# Patient Record
Sex: Male | Born: 1935 | Race: White | Hispanic: No | Marital: Married | Smoking: Former smoker
Health system: Southern US, Community
[De-identification: ages and names within clinical notes are randomized; demographics above are authoritative.]

## PROBLEM LIST (undated history)

## (undated) DIAGNOSIS — E785 Hyperlipidemia, unspecified: Secondary | ICD-10-CM

## (undated) DIAGNOSIS — C801 Malignant (primary) neoplasm, unspecified: Secondary | ICD-10-CM

## (undated) DIAGNOSIS — I5030 Unspecified diastolic (congestive) heart failure: Secondary | ICD-10-CM

## (undated) DIAGNOSIS — E119 Type 2 diabetes mellitus without complications: Secondary | ICD-10-CM

## (undated) DIAGNOSIS — I2510 Atherosclerotic heart disease of native coronary artery without angina pectoris: Secondary | ICD-10-CM

## (undated) DIAGNOSIS — C61 Malignant neoplasm of prostate: Secondary | ICD-10-CM

## (undated) HISTORY — DX: Atherosclerotic heart disease of native coronary artery without angina pectoris: I25.10

## (undated) HISTORY — DX: Unspecified diastolic (congestive) heart failure: I50.30

## (undated) HISTORY — DX: Malignant neoplasm of prostate: C61

## (undated) HISTORY — DX: Malignant (primary) neoplasm, unspecified: C80.1

## (undated) HISTORY — PX: MASTOIDECTOMY: SHX711

## (undated) HISTORY — PX: TONSILLECTOMY: SUR1361

## (undated) HISTORY — DX: Type 2 diabetes mellitus without complications: E11.9

## (undated) HISTORY — DX: Hyperlipidemia, unspecified: E78.5

---

## 2004-03-27 ENCOUNTER — Ambulatory Visit: Payer: Self-pay | Admitting: Urology

## 2004-04-13 ENCOUNTER — Other Ambulatory Visit: Payer: Self-pay

## 2004-04-13 ENCOUNTER — Ambulatory Visit: Payer: Self-pay | Admitting: Urology

## 2004-04-17 ENCOUNTER — Ambulatory Visit: Payer: Self-pay | Admitting: Urology

## 2004-04-26 ENCOUNTER — Ambulatory Visit: Payer: Self-pay | Admitting: Internal Medicine

## 2004-09-22 ENCOUNTER — Ambulatory Visit: Payer: Self-pay | Admitting: Urology

## 2004-11-16 ENCOUNTER — Inpatient Hospital Stay: Payer: Self-pay | Admitting: Internal Medicine

## 2004-11-17 ENCOUNTER — Other Ambulatory Visit: Payer: Self-pay

## 2004-11-18 ENCOUNTER — Other Ambulatory Visit: Payer: Self-pay

## 2004-12-08 ENCOUNTER — Ambulatory Visit: Payer: Self-pay | Admitting: Internal Medicine

## 2004-12-25 ENCOUNTER — Ambulatory Visit: Payer: Self-pay | Admitting: Internal Medicine

## 2005-02-23 ENCOUNTER — Ambulatory Visit: Payer: Self-pay | Admitting: Urology

## 2005-02-26 HISTORY — PX: PROSTATE SURGERY: SHX751

## 2005-02-26 HISTORY — PX: BLADDER REMOVAL: SHX567

## 2005-02-27 ENCOUNTER — Ambulatory Visit: Payer: Self-pay | Admitting: Urology

## 2005-03-19 ENCOUNTER — Ambulatory Visit: Payer: Self-pay | Admitting: Urology

## 2005-03-20 ENCOUNTER — Ambulatory Visit: Payer: Self-pay | Admitting: Gastroenterology

## 2005-07-03 ENCOUNTER — Ambulatory Visit: Payer: Self-pay | Admitting: Urology

## 2005-07-09 ENCOUNTER — Ambulatory Visit: Payer: Self-pay | Admitting: Urology

## 2006-01-31 ENCOUNTER — Ambulatory Visit: Payer: Self-pay | Admitting: Urology

## 2006-08-02 ENCOUNTER — Ambulatory Visit: Payer: Self-pay | Admitting: Urology

## 2006-08-12 ENCOUNTER — Other Ambulatory Visit: Payer: Self-pay

## 2006-08-12 ENCOUNTER — Emergency Department: Payer: Self-pay | Admitting: Emergency Medicine

## 2007-02-03 ENCOUNTER — Ambulatory Visit: Payer: Self-pay | Admitting: Urology

## 2007-08-13 ENCOUNTER — Ambulatory Visit: Payer: Self-pay | Admitting: Urology

## 2008-02-11 ENCOUNTER — Ambulatory Visit: Payer: Self-pay | Admitting: Urology

## 2008-02-20 ENCOUNTER — Emergency Department: Payer: Self-pay

## 2008-05-27 ENCOUNTER — Inpatient Hospital Stay: Payer: Self-pay | Admitting: Internal Medicine

## 2009-03-03 ENCOUNTER — Ambulatory Visit: Payer: Self-pay | Admitting: Urology

## 2010-03-08 ENCOUNTER — Ambulatory Visit: Payer: Self-pay | Admitting: Urology

## 2011-03-12 ENCOUNTER — Ambulatory Visit: Payer: Self-pay | Admitting: Urology

## 2011-05-17 ENCOUNTER — Ambulatory Visit: Payer: Self-pay | Admitting: Cardiology

## 2011-05-17 LAB — CBC WITH DIFFERENTIAL/PLATELET
Basophil %: 1.1 %
Eosinophil #: 0.2 10*3/uL (ref 0.0–0.7)
Eosinophil %: 4.4 %
HGB: 11.3 g/dL — ABNORMAL LOW (ref 13.0–18.0)
Lymphocyte %: 27.4 %
MCH: 30.5 pg (ref 26.0–34.0)
MCV: 91 fL (ref 80–100)
Monocyte #: 0.4 10*3/uL (ref 0.0–0.7)
Monocyte %: 8.8 %
Platelet: 159 10*3/uL (ref 150–440)
RBC: 3.7 10*6/uL — ABNORMAL LOW (ref 4.40–5.90)
WBC: 5 10*3/uL (ref 3.8–10.6)

## 2011-05-17 LAB — BASIC METABOLIC PANEL
Anion Gap: 10 (ref 7–16)
BUN: 30 mg/dL — ABNORMAL HIGH (ref 7–18)
Creatinine: 1.67 mg/dL — ABNORMAL HIGH (ref 0.60–1.30)
EGFR (African American): 50 — ABNORMAL LOW
EGFR (Non-African Amer.): 41 — ABNORMAL LOW
Glucose: 140 mg/dL — ABNORMAL HIGH (ref 65–99)
Sodium: 143 mmol/L (ref 136–145)

## 2011-05-17 LAB — APTT: Activated PTT: 28.2 secs (ref 23.6–35.9)

## 2011-05-24 ENCOUNTER — Ambulatory Visit: Payer: Self-pay | Admitting: Cardiology

## 2011-10-07 ENCOUNTER — Emergency Department: Payer: Self-pay | Admitting: Emergency Medicine

## 2011-10-07 LAB — CBC
HCT: 33.3 % — ABNORMAL LOW (ref 40.0–52.0)
HGB: 11.5 g/dL — ABNORMAL LOW (ref 13.0–18.0)
MCHC: 34.6 g/dL (ref 32.0–36.0)
MCV: 91 fL (ref 80–100)
RBC: 3.67 10*6/uL — ABNORMAL LOW (ref 4.40–5.90)
RDW: 13.6 % (ref 11.5–14.5)
WBC: 5.2 10*3/uL (ref 3.8–10.6)

## 2011-10-07 LAB — COMPREHENSIVE METABOLIC PANEL
Alkaline Phosphatase: 90 U/L (ref 50–136)
Anion Gap: 8 (ref 7–16)
Bilirubin,Total: 0.2 mg/dL (ref 0.2–1.0)
Calcium, Total: 8.6 mg/dL (ref 8.5–10.1)
Chloride: 107 mmol/L (ref 98–107)
Co2: 26 mmol/L (ref 21–32)
Creatinine: 1.42 mg/dL — ABNORMAL HIGH (ref 0.60–1.30)
EGFR (Non-African Amer.): 43 — ABNORMAL LOW
Osmolality: 292 (ref 275–301)
Potassium: 4.3 mmol/L (ref 3.5–5.1)
SGPT (ALT): 20 U/L (ref 12–78)
Sodium: 141 mmol/L (ref 136–145)

## 2011-10-07 LAB — TROPONIN I: Troponin-I: 0.04 ng/mL

## 2011-10-09 ENCOUNTER — Ambulatory Visit: Payer: Self-pay | Admitting: Cardiology

## 2012-05-09 ENCOUNTER — Ambulatory Visit: Payer: Self-pay | Admitting: Urology

## 2013-11-23 ENCOUNTER — Inpatient Hospital Stay: Payer: Self-pay | Admitting: Internal Medicine

## 2013-11-23 LAB — CK TOTAL AND CKMB (NOT AT ARMC)
CK, Total: 156 U/L
CK, Total: 203 U/L
CK-MB: 4.2 ng/mL — AB (ref 0.5–3.6)
CK-MB: 4.6 ng/mL — AB (ref 0.5–3.6)

## 2013-11-23 LAB — PROTIME-INR
INR: 1
Prothrombin Time: 13 secs (ref 11.5–14.7)

## 2013-11-23 LAB — COMPREHENSIVE METABOLIC PANEL
ALK PHOS: 78 U/L
ANION GAP: 5 — AB (ref 7–16)
Albumin: 3.5 g/dL (ref 3.4–5.0)
BUN: 29 mg/dL — AB (ref 7–18)
Bilirubin,Total: 0.3 mg/dL (ref 0.2–1.0)
CO2: 26 mmol/L (ref 21–32)
CREATININE: 1.53 mg/dL — AB (ref 0.60–1.30)
Calcium, Total: 8.3 mg/dL — ABNORMAL LOW (ref 8.5–10.1)
Chloride: 108 mmol/L — ABNORMAL HIGH (ref 98–107)
EGFR (African American): 55 — ABNORMAL LOW
GFR CALC NON AF AMER: 45 — AB
GLUCOSE: 148 mg/dL — AB (ref 65–99)
OSMOLALITY: 286 (ref 275–301)
Potassium: 4.5 mmol/L (ref 3.5–5.1)
SGOT(AST): 35 U/L (ref 15–37)
SGPT (ALT): 30 U/L
Sodium: 139 mmol/L (ref 136–145)
Total Protein: 7.1 g/dL (ref 6.4–8.2)

## 2013-11-23 LAB — CBC
HCT: 34 % — ABNORMAL LOW (ref 40.0–52.0)
HGB: 11.3 g/dL — ABNORMAL LOW (ref 13.0–18.0)
MCH: 30.5 pg (ref 26.0–34.0)
MCHC: 33.3 g/dL (ref 32.0–36.0)
MCV: 92 fL (ref 80–100)
Platelet: 155 10*3/uL (ref 150–440)
RBC: 3.72 10*6/uL — ABNORMAL LOW (ref 4.40–5.90)
RDW: 13.7 % (ref 11.5–14.5)
WBC: 6.8 10*3/uL (ref 3.8–10.6)

## 2013-11-23 LAB — URINALYSIS, COMPLETE
Bilirubin,UR: NEGATIVE
GLUCOSE, UR: NEGATIVE mg/dL (ref 0–75)
KETONE: NEGATIVE
NITRITE: POSITIVE
Ph: 9 (ref 4.5–8.0)
Protein: 30
Specific Gravity: 1.009 (ref 1.003–1.030)
Squamous Epithelial: 2
WBC UR: 17 /HPF (ref 0–5)

## 2013-11-23 LAB — TROPONIN I
Troponin-I: 0.08 ng/mL — ABNORMAL HIGH
Troponin-I: 0.2 ng/mL — ABNORMAL HIGH
Troponin-I: 0.19 ng/mL — ABNORMAL HIGH

## 2013-11-23 LAB — APTT: Activated PTT: 30.4 secs (ref 23.6–35.9)

## 2013-11-24 LAB — BASIC METABOLIC PANEL
ANION GAP: 6 — AB (ref 7–16)
BUN: 22 mg/dL — ABNORMAL HIGH (ref 7–18)
CALCIUM: 8.6 mg/dL (ref 8.5–10.1)
CO2: 26 mmol/L (ref 21–32)
CREATININE: 1.34 mg/dL — AB (ref 0.60–1.30)
Chloride: 110 mmol/L — ABNORMAL HIGH (ref 98–107)
EGFR (African American): >60
EGFR (Non-African Amer.): 53 — ABNORMAL LOW
GLUCOSE: 117 mg/dL — AB (ref 65–99)
Osmolality: 287 (ref 275–301)
POTASSIUM: 4.5 mmol/L (ref 3.5–5.1)
Sodium: 142 mmol/L (ref 136–145)

## 2013-11-24 LAB — CBC WITH DIFFERENTIAL/PLATELET
Basophil #: 0 10*3/uL (ref 0.0–0.1)
Basophil %: 0.7 %
EOS PCT: 4.9 %
Eosinophil #: 0.3 10*3/uL (ref 0.0–0.7)
HCT: 30.6 % — ABNORMAL LOW (ref 40.0–52.0)
HGB: 10.3 g/dL — ABNORMAL LOW (ref 13.0–18.0)
LYMPHS ABS: 0.9 10*3/uL — AB (ref 1.0–3.6)
LYMPHS PCT: 12.1 %
MCH: 30.5 pg (ref 26.0–34.0)
MCHC: 33.6 g/dL (ref 32.0–36.0)
MCV: 91 fL (ref 80–100)
MONO ABS: 0.5 x10 3/mm (ref 0.2–1.0)
Monocyte %: 7.1 %
NEUTROS ABS: 5.3 10*3/uL (ref 1.4–6.5)
Neutrophil %: 75.2 %
Platelet: 118 10*3/uL — ABNORMAL LOW (ref 150–440)
RBC: 3.37 10*6/uL — AB (ref 4.40–5.90)
RDW: 13.9 % (ref 11.5–14.5)
WBC: 7.1 10*3/uL (ref 3.8–10.6)

## 2013-11-25 LAB — HEMOGLOBIN: HGB: 9.6 g/dL — AB (ref 13.0–18.0)

## 2013-11-25 LAB — URINE CULTURE

## 2013-11-26 LAB — BASIC METABOLIC PANEL
ANION GAP: 5 — AB (ref 7–16)
BUN: 25 mg/dL — AB (ref 7–18)
CALCIUM: 8.3 mg/dL — AB (ref 8.5–10.1)
Chloride: 109 mmol/L — ABNORMAL HIGH (ref 98–107)
Co2: 27 mmol/L (ref 21–32)
Creatinine: 1.5 mg/dL — ABNORMAL HIGH (ref 0.60–1.30)
EGFR (African American): 56 — ABNORMAL LOW
GFR CALC NON AF AMER: 47 — AB
Glucose: 117 mg/dL — ABNORMAL HIGH (ref 65–99)
Osmolality: 287 (ref 275–301)
Potassium: 4.3 mmol/L (ref 3.5–5.1)
Sodium: 141 mmol/L (ref 136–145)

## 2013-11-26 LAB — CBC WITH DIFFERENTIAL/PLATELET
BASOS ABS: 0 10*3/uL (ref 0.0–0.1)
Basophil %: 1 %
EOS PCT: 7.2 %
Eosinophil #: 0.3 10*3/uL (ref 0.0–0.7)
HCT: 27.7 % — AB (ref 40.0–52.0)
HGB: 9.3 g/dL — ABNORMAL LOW (ref 13.0–18.0)
Lymphocyte #: 0.8 10*3/uL — ABNORMAL LOW (ref 1.0–3.6)
Lymphocyte %: 17.4 %
MCH: 30.4 pg (ref 26.0–34.0)
MCHC: 33.5 g/dL (ref 32.0–36.0)
MCV: 91 fL (ref 80–100)
Monocyte #: 0.5 x10 3/mm (ref 0.2–1.0)
Monocyte %: 10.5 %
NEUTROS ABS: 3.1 10*3/uL (ref 1.4–6.5)
Neutrophil %: 63.9 %
Platelet: 118 10*3/uL — ABNORMAL LOW (ref 150–440)
RBC: 3.05 10*6/uL — ABNORMAL LOW (ref 4.40–5.90)
RDW: 13.5 % (ref 11.5–14.5)
WBC: 4.8 10*3/uL (ref 3.8–10.6)

## 2013-11-27 ENCOUNTER — Encounter: Payer: Self-pay | Admitting: Internal Medicine

## 2013-12-05 ENCOUNTER — Emergency Department: Payer: Self-pay | Admitting: Emergency Medicine

## 2013-12-05 LAB — CBC WITH DIFFERENTIAL/PLATELET
Basophil #: 0.1 10*3/uL (ref 0.0–0.1)
Basophil %: 1.1 %
Eosinophil #: 0.4 10*3/uL (ref 0.0–0.7)
Eosinophil %: 6.7 %
HCT: 28.3 % — AB (ref 40.0–52.0)
HGB: 9.2 g/dL — AB (ref 13.0–18.0)
LYMPHS ABS: 1.3 10*3/uL (ref 1.0–3.6)
LYMPHS PCT: 20 %
MCH: 29.9 pg (ref 26.0–34.0)
MCHC: 32.5 g/dL (ref 32.0–36.0)
MCV: 92 fL (ref 80–100)
Monocyte #: 0.5 x10 3/mm (ref 0.2–1.0)
Monocyte %: 8.3 %
NEUTROS ABS: 4.2 10*3/uL (ref 1.4–6.5)
Neutrophil %: 63.9 %
PLATELETS: 224 10*3/uL (ref 150–440)
RBC: 3.07 10*6/uL — AB (ref 4.40–5.90)
RDW: 13.7 % (ref 11.5–14.5)
WBC: 6.5 10*3/uL (ref 3.8–10.6)

## 2013-12-05 LAB — COMPREHENSIVE METABOLIC PANEL
Albumin: 3 g/dL — ABNORMAL LOW (ref 3.4–5.0)
Alkaline Phosphatase: 68 U/L
Anion Gap: 6 — ABNORMAL LOW (ref 7–16)
BILIRUBIN TOTAL: 0.3 mg/dL (ref 0.2–1.0)
BUN: 36 mg/dL — ABNORMAL HIGH (ref 7–18)
CHLORIDE: 102 mmol/L (ref 98–107)
CO2: 28 mmol/L (ref 21–32)
Calcium, Total: 8.1 mg/dL — ABNORMAL LOW (ref 8.5–10.1)
Creatinine: 1.6 mg/dL — ABNORMAL HIGH (ref 0.60–1.30)
GFR CALC AF AMER: 52 — AB
GFR CALC NON AF AMER: 43 — AB
Glucose: 135 mg/dL — ABNORMAL HIGH (ref 65–99)
Osmolality: 282 (ref 275–301)
Potassium: 4.6 mmol/L (ref 3.5–5.1)
SGOT(AST): 28 U/L (ref 15–37)
SGPT (ALT): 21 U/L
Sodium: 136 mmol/L (ref 136–145)
TOTAL PROTEIN: 6.2 g/dL — AB (ref 6.4–8.2)

## 2013-12-05 LAB — TROPONIN I
Troponin-I: 0.03 ng/mL
Troponin-I: 0.03 ng/mL

## 2013-12-27 ENCOUNTER — Encounter: Payer: Self-pay | Admitting: Internal Medicine

## 2014-01-26 ENCOUNTER — Encounter: Payer: Self-pay | Admitting: Internal Medicine

## 2014-02-26 ENCOUNTER — Encounter: Payer: Self-pay | Admitting: Internal Medicine

## 2014-03-01 DIAGNOSIS — E1165 Type 2 diabetes mellitus with hyperglycemia: Secondary | ICD-10-CM | POA: Diagnosis not present

## 2014-03-01 DIAGNOSIS — Z79899 Other long term (current) drug therapy: Secondary | ICD-10-CM | POA: Diagnosis not present

## 2014-03-02 DIAGNOSIS — N183 Chronic kidney disease, stage 3 (moderate): Secondary | ICD-10-CM | POA: Diagnosis not present

## 2014-03-02 DIAGNOSIS — I2510 Atherosclerotic heart disease of native coronary artery without angina pectoris: Secondary | ICD-10-CM | POA: Diagnosis not present

## 2014-03-02 DIAGNOSIS — E1121 Type 2 diabetes mellitus with diabetic nephropathy: Secondary | ICD-10-CM | POA: Diagnosis not present

## 2014-03-02 DIAGNOSIS — I10 Essential (primary) hypertension: Secondary | ICD-10-CM | POA: Diagnosis not present

## 2014-04-27 DIAGNOSIS — M25551 Pain in right hip: Secondary | ICD-10-CM | POA: Diagnosis not present

## 2014-06-17 DIAGNOSIS — I10 Essential (primary) hypertension: Secondary | ICD-10-CM | POA: Diagnosis not present

## 2014-06-17 DIAGNOSIS — E119 Type 2 diabetes mellitus without complications: Secondary | ICD-10-CM | POA: Diagnosis not present

## 2014-06-17 DIAGNOSIS — R079 Chest pain, unspecified: Secondary | ICD-10-CM | POA: Diagnosis not present

## 2014-06-17 DIAGNOSIS — R0602 Shortness of breath: Secondary | ICD-10-CM | POA: Diagnosis not present

## 2014-06-19 NOTE — Op Note (Signed)
PATIENT NAME:  Vincent Jackson, Vincent Jackson MR#:  [id number] DATE OF BIRTH:  1919-12-27  DATE OF PROCEDURE:  11/23/2013  PREOPERATIVE DIAGNOSIS: Right impacted femoral neck hip fracture.   POSTOPERATIVE DIAGNOSIS: Right impacted femoral neck hip fracture.   PROCEDURE: Percutaneous fixation of right impacted femoral neck hip fracture.   SURGEON: Thornton Park, MD.   ANESTHESIA: Spinal.   ESTIMATED BLOOD LOSS: Minimal.   COMPLICATIONS: None.   IMPLANTS: Synthes 7.3 mm cannulated screws x 3.   HISTORY OF PRESENT ILLNESS: Mr. Mcpheeters is a 81 year old male who fell onto his right side. He was brought to the [facility] Emergency Department where x-rays revealed an impacted right femoral neck hip fracture. The patient had been cleared by the hospitalist service for surgery. I had met with the patient preoperatively to explain his injury and also to inform him that I had recommended surgical intervention for this injury. The patient agreed to surgery. He understands the risks of surgery including infection bleeding, nerve or blood vessel injury, leg length discrepancy, change in lower extremity rotation, persistent right hip pain, avascular necrosis, hardware failure, and the need for further surgery including conversion to a hemi or total hip arthroplasty. Medical risks include but are not limited to infection, DVT, pulmonary embolism, myocardial infarction, stroke, pneumonia, respiratory failure and death. The patient understood these risks. He wished to proceed.   DESCRIPTION OF PROCEDURE:  I marked the right hip according to the hospital's right site protocol. He was then brought to the operating room where he underwent a spinal anesthetic. He was placed supine on the fracture table. His left leg was placed in a leg holder in a hemi-lithotomy position. His right leg was placed in a leg holder and his right leg was left in extension. Because this fracture was impacted and nondisplaced traction was not  applied to the right leg.  He was prepped and draped in a sterile fashion.   A timeout was performed to verify the patient's name, date of birth, medical record number, correct site of surgery, and correct procedure to be performed. It was also used to verify the patient had received antibiotics and that all appropriate instruments, implants, and radiographic studies were available in the room. Once all in attendance were in agreement the case began.   AP and lateral C-arm images were performed of the right hip to confirm that the hip remained nondisplaced. The proposed incision was then drawn out with a surgical marker based upon C-arm imaging. A lateral incision was made approximately 3 cm in length. The subcutaneous tissues were dissected using electrocautery. All bleeding vessels were cauterized during exposure. The fascia lata was then incised with a deep number 10 blade. A single threaded guide pin was then advanced through the lateral cortex of the femur, across the fracture site, and into the inferior aspect of the femoral head. Two additional threaded K wires were then placed superior to this initial guidewire in an inverted triangle formation. All positions of the threaded guidewires were confirmed on AP and lateral C-arm images. These threaded guidewires were then measured with a depth gauge and then overdrilled. Three threaded 7.3 cannulated screws were then advanced over these guidewires and into position. The inferior-most screw was a long threaded screw and the 2 superior screws were short threaded.  C-arm imaging was used to confirm placement of the screws following placement. All screws were found to cross the fracture site. The fracture remained well reduced and there was no screw penetration into the  hip joint. The wound was then copiously irrigated. The fascia lata was closed with interrupted 0 Vicryl, the subcutaneous tissue closed with 2-0 Vicryl, and the skin was approximated with staples.  The patient was then transferred to a hospital bed and brought to the PACU in stable condition. I was scrubbed and present for the entire case and all sharp and instrument counts were correct at the conclusion of the case. I spoke with the patient's family in the postoperative consultation room to let them know the case had gone without complication. The patient was stable in the recovery room.    ____________________________ Timoteo Gaul, MD klk:bu D: 11/27/2013 17:22:00 ET T: 11/27/2013 17:58:31 ET JOB#: [id number]  cc: Timoteo Gaul, MD, <Dictator> Timoteo Gaul MD ELECTRONICALLY SIGNED 12/03/2013 18:51

## 2014-06-19 NOTE — H&P (Signed)
PATIENT NAME:  Vincent Jackson, Vincent Jackson MR#:  [id number] DATE OF BIRTH:  [birth year]  DATE OF ADMISSION:  11/23/2013   REFERRING PHYSICIAN: Ahmed Prima, MD  PRIMARY CARE PHYSICIAN: Derinda Late, MD   CHIEF COMPLAINT: Hip pain.  HISTORY OF PRESENT ILLNESS: The patient is a 81 year old Caucasian gentleman with a past medical history of coronary artery disease, sick sinus syndrome status post permanent pacemaker insertion, type 2 diabetes non insulin requiring and uncomplicated, presenting with hip pain. He lives at [place] and had a mechanical fall while walking down the hallway taking the trash out. He had immediate pain focused over the right hip, described as only as "pain". Intensity 10 out of 10 and worse with movements. Some relief with rest. No radiation. He denies any further symptomatology. Denies chest pain, palpitations, or syncope at this time and has no further complaints.   REVIEW OF SYSTEMS:  CONSTITUTIONAL: Denies fever, fatigue, weakness.  EYES: Denies blurred vision, double vision or eye pain.  EARS, NOSE, THROAT: Denies tinnitus ear pain, hearing loss.  RESPIRATORY: No cough, wheeze, or shortness of breath.  CARDIOVASCULAR: Denies chest pain, palpitations, edema.  GASTROINTESTINAL: Denies nausea, vomiting, diarrhea or abdominal pain.  GENITOURINARY: Denies dysuria or hematuria.  ENDOCRINE: Denies nocturia or thyroid problems.  HEMATOLOGIC AND LYMPHATIC: Denies easy bruising or bleeding.  SKIN: Denies rashes or lesions.  MUSCULOSKELETAL: Positive for pain in right hip as described above. Denies any pain in neck, back, shoulder, knees, or further arthritic symptoms.  NEUROLOGIC: Denies paralysis or paresthesias.  PSYCHIATRIC: Denies anxiety or depressive symptoms.  Otherwise, full review of systems is performed and is negative.   PAST MEDICAL HISTORY: Coronary artery disease and sick sinus syndrome status post permanent pacemaker insertion,  hypertension, hyperlipidemia, history of COPD non oxygen dependent,  as well as history of prostate and bladder cancer status post resections.   SOCIAL HISTORY: Positive for remote tobacco use. Denies any alcohol or drug use. Does not require a cane or a walker for ambulation.   FAMILY HISTORY: Positive for hypertension.   ALLERGIES: No known drug allergies.   HOME MEDICATIONS INCLUDE: Aspirin 81 mg p.o. daily, glipizide 10 mg p.o. b.i.d., simvastatin 40 mg p.o. at bedtime, metoprolol succinate unknown dosage, Latanoprost 0.005% solution 1 drop to each eye at bedtime, Protonix 40 mg p.o. daily, Centrum Silver 1 tablet p.o. daily.   PHYSICAL EXAMINATION:  VITAL SIGNS: Temperature 98.3, heart rate 85, respirations 18, blood pressure 144/80, saturating 96% on supplemental oxygen. Weight 76.2 kg, BMI 22.8.  GENERAL: Well-nourished, well-developed, Caucasian gentleman, currently in no acute distress.  HEAD: Normocephalic, atraumatic.  EYES: Pupils equal, round, reactive to light. Extraocular muscles are intact. No scleral icterus.  MOUTH: Moist mucosal membrane. Dentition intact. No abscess noted.  EARS, NOSE, AND THROAT: Clear, without exudates. No external lesions.  NECK: Supple. No thyromegaly. No nodules. No JVD.  PULMONARY: Diminished breath sounds throughout all lung fields secondary to respiratory effort; however, no wheezes, rales, or rhonchi.  CHEST: Nontender to palpation. Poor respiratory effort as stated above.  CARDIOVASCULAR: S1, S2, regular rate and rhythm. No murmurs, rubs, or gallops. No edema. Pedal pulses 2+ bilaterally.  GASTROINTESTINAL: Soft, nontender, nondistended. No masses. Positive bowel sounds. No hepatosplenomegaly.  MUSCULOSKELETAL: No swelling, clubbing, or edema. Proximal range of motion limited in the right leg secondary to hip fracture and pain. Otherwise, range of motion full in all extremities.  NEUROLOGIC: Cranial nerves II through XII intact. No gross focal  neurological deficits. Sensation  intact. Reflexes intact.  SKIN: No ulcerations, lesions, rash, cyanosis. Skin warm and dry. Turgor intact. PSYCHIATRIC: Mood and affect within normal limits. The patient is awake, alert and oriented x 3. Insight and judgment intact.   LABORATORY DATA: EKG reveals ventricular paced rhythm, heart rate of 88, and no ST or T- wave abnormalities.   Chest x-ray performed reveals no acute cardiopulmonary process.   X-ray of the right hip performed reveals an impacted subcapital fracture of the right femoral neck.   Remainder of laboratory data: Sodium 139, potassium 4.5, chloride 108, bicarbonate 26, BUN 29, creatinine 1.53 glucose 148. LFTs within normal limits. Troponin 0.08. WBC 6.8, hemoglobin 11.3, platelets of 155,000. Urinalysis: WBC 17, RBC 12, leukocyte esterase trace, nitrite positive, epithelial cells 2.   ASSESSMENT AND PLAN: A 81 year old Caucasian gentleman with history of coronary artery disease as well as sick sinus syndrome status post permanent pacemaker insertion and diabetes, presenting with hip pain after a mechanical fall. He was found to have right-sided hip fracture.  1.  Impacted subcapital fracture of the right femoral neck admitted for pain control and venous  thromboembolism prophylaxis to orthopedics. We will consult orthopedics. Add a bowel regimen.  2.  Preoperative evaluation. Note he has no exertional symptoms, including chest pain, shortness of breath, or dyspnea on exertion. He has no evidence of active congestive heart failure or significant arrhythmias, as well as no evidence of significant valvular dysfunction. His METs would be considered less than 4, given generalized slowness of walking; however, he has no difficulty walking up stairs or on a flat surface. Given his mild elevated troponin, we will continue to trend his cardiac enzymes prior to surgery to make sure they either remain the same or trend downwards. If that is the case, he  will require no further testing or intervention prior to surgery. As far as medications are concerned, should hold aspirin as well as p.o. diabetic agents. Continue his beta blockade and statin therapy.  3.  Elevated troponin. No evidence of cardiovascular symptoms. Place on telemetry. Trend cardiac enzymes as stated above. He follows with [facility], Dr. Isaias Cowman.  4.  Type 2 diabetes, uncomplicated. Hold p.o. agents. Add insulin sliding scale and q. 6 hour Accu-Cheks.  5.  Gastroesophageal reflux disease. Continue proton pump inhibitor therapy.  6.  Venous thromboembolus prophylaxis. Defer to orthopedic as stated above; however, sequential compression devices for now.   CODE STATUS: The patient is Full Code.   TIME SPENT: 45 minutes    ____________________________ Aaron Mose. Cathe Bilger, MD dkh:MT D: 11/23/2013 03:46:29 ET T: 11/23/2013 05:12:18 ET JOB#: [id number]  cc: Aaron Mose. Braun Rocca, MD, <Dictator> Ahrianna Siglin Woodfin Ganja MD ELECTRONICALLY SIGNED 11/23/2013 20:24

## 2014-06-19 NOTE — Consult Note (Signed)
Brief Consult Note: Diagnosis: Right impacted femoral neck hip fracture.   Patient was seen by consultant.   Consult note dictated.   Recommend to proceed with surgery or procedure.   Orders entered.   Discussed with Attending MD.   Comments: I spoke with patient and his wife, son and grandson.  They understand he has an impacted right femoral neck hip fracture that requires surgery.  I recommended percutaneous fixation with cannulated screws.  He has been cleared for surgery by hospitalist and Dr. Saralyn Pilar, his cardiologist.  I explained the details of surgery with them.  I used the white board in his room to diagram the injury and proposed surgery.  The risks include, but are not limited to: infection, bleeding requiring transfusion, nerve and blood vessel injury (especially the sciatic nerve leading to foot drop), dislocation, fracture, leg length discrepancy, need for more surgery including conversion to a total hip arthroplasty, DVT, and PE. The risks and benefits of surgical intervention were discussed in detail with the patient. The patient expressed understanding of the risks and benefits and agreed with plans for surgery. Surgical site signed as per "right site surgery" protocol.  Electronic Signatures: Thornton Park (MD)  (Signed 28-Sep-15 13:43)  Authored: Brief Consult Note   Last Updated: 28-Sep-15 13:43 by Thornton Park (MD)

## 2014-06-19 NOTE — Discharge Summary (Signed)
PATIENT NAME:  Vincent Jackson, Vincent Jackson MR#:  [id number] DATE OF BIRTH:  [birth year]  DATE OF ADMISSION:  11/23/2013 DATE OF DISCHARGE:  11/26/2013  DISCHARGE DIAGNOSES:   1.  Impacted subcapital fracture of right femoral neck.  2.  Proteus mirabilis urinary tract infection in patient with surgically removed bladder.  3.  Diabetes mellitus.  4.  Chronic kidney disease stage III.  5.  Coronary artery disease.  6.  Sick sinus syndrome status post permanent pacemaker insertion.  7.  Hypertension.  8.  Hyperlipidemia.  9.  History of chronic obstructive pulmonary disease, non-oxygen-dependent.  10. History of prostate and bladder cancer, status post resections.  CONSULTATIONS:  1.  Dr. Thornton Park, orthopedics.  2.  Dr. Isaias Cowman, cardiology.   PROCEDURES: Percutaneous fixation of right impacted femoral neck hip fracture.   BRIEF HISTORY OF PRESENT ILLNESS:  This very pleasant 81 year old Caucasian gentleman with past medical history of coronary artery disease, sick sinus syndrome with pacemaker, type 2 diabetes, non-insulin-requiring, presents with hip pain. He is a resident at the assisted living [facility] and had mechanical fall while walking down the hallway to take out the trash. He had immediate pain in the right hip, intensity 10/10, worse with movement.   HOSPITAL COURSE AND TREATMENT:  1.  Impacted subcapital fracture of the right femoral neck. Had percutaneous fixation by Dr. Thornton Park on September 28 without complication. He is doing very well with physical and occupational therapy. He is discharged to [facility] skilled nursing for continued physical therapy. He will continue on Lovenox per hip fracture protocol.  2.  Proteus mirabilis urinary tract infection: The patient is status post surgical removal of the bladder and prostate due to prostate cancer. Urine is growing Proteus mirabilis, which is pansensitive. He has been treated with 3 days of Rocephin.  Urine should be checked within 1 week to ensure clearance. If infection is persistent would have urology consultation as an outpatient.  3.  History of coronary artery disease: The patient had transient elevation in his troponin on admission. He had no EKG changes or chest pain. He was seen by Dr. Isaias Cowman of cardiology and cleared for surgery. He continues on a statin and beta blocker.  4.  Sick sinus syndrome with pacemaker placed. No unusual arrhythmia on telemetry.  5.  Diabetes mellitus: The patient was on sliding scale insulin during hospitalization but can resume home glipizide after discharge.  6.  Acute on chronic anemia: Prior baseline hemoglobin was about 11, likely due to chronic kidney disease. During hospitalization it dropped from 10.3-9.3, likely due to blood loss from surgery. This should be rechecked in about 2 weeks to be sure that hemoglobin levels are increasing. There is no bleeding noted on day of discharge.  7.  Chronic kidney disease stage III with a baseline GFR of about 50. This should be monitored in the future as an outpatient.   DISPOSITION: The patient is being discharged to [facility] skilled nursing for continued rehabilitation.   DISCHARGE PHYSICAL EXAMINATION:  VITAL SIGNS: Temperature 98.6, heart rate 76, respirations 18, blood pressure 137/69, oxygenation 94% on 3 liters.  GENERAL: The patient is alert, oriented. No distress.  HEENT: Mucous membranes are moist. Good dentition.  OROPHARYNX: Clear. Pupils equal, round, and reactive to light. Extraocular motion is intact.  PULMONARY: The lungs are clear to auscultation bilaterally with good air movement.  CARDIOVASCULAR: Regular rate and rhythm, no murmurs, rubs, or gallops, no peripheral edema. Peripheral pulses are 2+.  ABDOMEN: Soft, nontender, nondistended. Bowel sounds are normal.  EXTREMITIES: No edema, clubbing, or cyanosis.  NEUROLOGIC: The patient is alert and oriented x 4. Cranial nerves II-XII  are grossly intact, nonfocal neurologic examination.   LABORATORY DATA: Sodium 141, potassium 4.3, chloride 109, bicarbonate 29, BUN 25, creatinine 1.5, blood glucose 128, hemoglobin 9.3, white count 4.8, platelets 118, MCV is 91.   Urine shows greater than 10,000 colony-forming units of Proteus mirabilis, sensitive to all antibiotics tested with the exception of nitrofurantoin.   IMAGING:   1.  X-ray of the right hip September 28 shows impacted subcapital fracture of the right femoral neck.  2.  X-ray of the chest September 28 shows no active cardiopulmonary disease.   CONDITION ON DISCHARGE: Stable.   DISPOSITION: Discharged to skilled nursing facility for continued physical therapy.   DISCHARGE MEDICATIONS:  1.  Aspirin 81 mg 1 tablet daily.  2.  Centrum Silver 1 tablet once a day.  3.  Glipizide 10 mg 1 tablet twice a day.  4.  Xalatan solution 0.005% one drop each eye once a day at bedtime.  5.  Protonix 40 mg 1 tablet once a day in the morning.  6.  Simvastatin 40 mg 1 tablet once a day at bedtime.  7.  Metoprolol succinate 25 mg 1 tablet once a day.  8.  Acetaminophen 325 mg 2 tablets every 4 hours as needed for temperature or pain.  9.  Magnesium hydroxide 8% oral suspension 30 mL twice a day as needed for constipation.  10. Enoxaparin 30 mg subcutaneously twice a day.  11. Ferrous sulfate 325 mg 2 tablets twice a day with meals.   DISCHARGE INSTRUCTIONS: Per orthopedics orders will follow up in orthopedics clinic within 10 days. We will continue with physical therapy.   DIET: Carbohydrate modified.   ACTIVITY: Per physical therapy recommendations.   FOLLOWUP: With orthopedics in 10 days. With primary care physician within 2 weeks. If urine cultures continue to be positive would follow up with urology within 2 weeks.   Time spent on discharge: 45 minutes.    ____________________________ Earleen Newport. Volanda Napoleon, MD cpw:lt D: 11/26/2013 11:19:31 ET T: 11/26/2013 11:30:55  ET JOB#: [id number]  cc: Barnetta Chapel P. Volanda Napoleon, MD, <Dictator> Aldean Jewett MD ELECTRONICALLY SIGNED 12/02/2013 13:32

## 2014-06-19 NOTE — Consult Note (Signed)
PATIENT NAME:  Vincent Jackson, Vincent Jackson MR#:  [id number] DATE OF BIRTH:  Oct 26, 1919  DATE OF CONSULTATION:  11/23/2013  REFERRING PHYSICIAN:  [name], MD CONSULTING PHYSICIAN:  Isaias Cowman, MD  PRIMARY CARE PHYSICIAN: Dr. Baldemar Lenis  PRIMARY CARDIOLOGIST: Isaias Cowman, MD  CHIEF COMPLAINT: Hip pain.   HISTORY OF PRESENT ILLNESS: The patient is a 81 year old gentleman with history of coronary artery disease status post CABG, sick sinus syndrome status post pacemaker, with type 2 diabetes admitted with hip fracture. The patient currently is a resident at [facility], fell and fractured his right hip. The patient presented to [facility] Emergency Room and is awaiting hip surgery. The patient denies chest pain. Initial troponin was 0.08 with follow-up troponin of 0.2. EKG reveals atrial sensing with ventricular pacing.   PAST MEDICAL HISTORY: 1.  Status post CABG x3 in 1998.  2.  Status post stent in distal RCA 09/25/2011. 3.  Status post Cypher stent to SVG to ramus 12/2002.  4.  Status post Cypher stent to ramus and RCA 08/09/2003. 5.  PTCA of in-stent restenosis of RCA 04/19/2009. 6.  Sick sinus syndrome status post dual-chamber pacemaker. 7.  Hypertension.  8.  Hyperlipidemia.  9.  COPD. 10.  Type 2 diabetes.   MEDICATIONS: Aspirin 81 mg daily, simvastatin 40 mg at bedtime, metoprolol succinate 1 daily, glipizide 10 mg b.i.d., latanoprost ophthalmic solution 1 drop each eye at bedtime, Protonix 40 mg daily, Centrum Silver 1 daily.   SOCIAL HISTORY: The patient is married, lives at the [place], has remote history of tobacco abuse.   FAMILY HISTORY: No immediate family history of coronary artery disease or myocardial infarction.   REVIEW OF SYSTEMS: CONSTITUTIONAL: No fever or chills.  EYES: No blurry vision.  EARS: No hearing loss.  RESPIRATORY: No shortness of breath.  CARDIOVASCULAR: No chest pain.  GASTROINTESTINAL: No nausea, vomiting, or diarrhea.   GENITOURINARY: No dysuria or hematuria.  ENDOCRINE: No polyuria or polydipsia.  MUSCULOSKELETAL: The patient has right hip fracture.  NEUROLOGIC: No focal muscle weakness or numbness.  PSYCHOLOGICAL: No depression or anxiety.   PHYSICAL EXAMINATION: VITAL SIGNS: Blood pressure 157/77, pulse 112, respirations 18, temperature 99.3, pulse ox 93%.  HEENT: Pupils equal and reactive to light and accommodation.  NECK: Supple without thyromegaly.  LUNGS: Clear.  HEART: Normal JVP. Normal PMI. Regular rate and rhythm. Normal S1, S2. No appreciable gallop, murmur, or rub.  ABDOMEN: Soft and nontender. Pulses were intact bilaterally.  MUSCULOSKELETAL: Normal muscle tone.  NEUROLOGIC: The patient is alert and oriented x3. Motor and sensory are both grossly intact.   IMPRESSION: This is a 81 year old gentleman with right hip fracture with known coronary artery disease status post bypass graft surgery and multiple coronary stents, currently chest pain free. The patient does have borderline elevated troponin likely due to demand supply ischemia.   RECOMMENDATIONS: 1.  Agree with overall current therapy.  2.  Restart long-acting metoprolol succinate 50 mg q. a.m. and continue pre, peri and postoperatively.  3.  Proceed with hip surgery as planned. The patient is aware of inherent risk of hip surgery and wishes to proceed.   ____________________________ Isaias Cowman, MD ap:sb D: 11/23/2013 09:00:39 ET T: 11/23/2013 09:13:11 ET JOB#: [id number]  cc: Isaias Cowman, MD, <Dictator> Isaias Cowman MD ELECTRONICALLY SIGNED 11/24/2013 12:17

## 2014-06-20 NOTE — Op Note (Signed)
PATIENT NAME:  Vincent Jackson, Vincent Jackson MR#:  [id number] DATE OF BIRTH:  January 03, 1920  DATE OF PROCEDURE:  05/24/2011  REFERRING PHYSICIAN: Dr. Arline Asp  PROCEDURE PERFORMED: Dual chamber pacemaker generator change out.   POSTPROCEDURE DIAGNOSIS: Atrial sensing with ventricular pacing.   SURGEON: Isaias Cowman, MD  INDICATION: Patient is an 81 year old gentleman who is status post dual-chamber pacemaker for complete heart block without underlying escape rhythm. Recent pacemaker interrogation has shown pacemaker generator was at elective replacement indication. Procedure, risks, benefits, and alternatives of dual chamber pacemaker generator change out were explained to patient and informed written consent was obtained.   DESCRIPTION OF PROCEDURE: He was brought to the Operating Room in a fasting state. The left pectoral region was prepped and draped in the usual sterile manner. Anesthesia was obtained with 1% Xylocaine locally. A 6 cm incision was performed over the left pectoral region. The old pacemaker generator was retrieved by electrocautery and blunt dissection. The old pacemaker generator was disconnected to the pacemaker leads and leads were interrogated. After proper thresholds were obtained, the leads were connected to a new dual chamber rate responsive pacemaker generator. Pacemaker pocket was irrigated with 1% gentamicin solution. The new pacemaker generator was positioned into the pocket and the pocket was closed with 2-0 and 4-0 Vicryl, respectively. Steri-Strips and pressure dressing were applied.  ____________________________ Isaias Cowman, MD ap:cms D: 05/24/2011 11:54:26 ET T: 05/24/2011 12:50:41 ET JOB#: [id number]  cc: Isaias Cowman, MD, <Dictator> Isaias Cowman MD ELECTRONICALLY SIGNED 06/13/2011 8:45

## 2014-06-21 DIAGNOSIS — R0602 Shortness of breath: Secondary | ICD-10-CM | POA: Diagnosis not present

## 2014-07-01 DIAGNOSIS — E780 Pure hypercholesterolemia: Secondary | ICD-10-CM | POA: Diagnosis not present

## 2014-07-01 DIAGNOSIS — E119 Type 2 diabetes mellitus without complications: Secondary | ICD-10-CM | POA: Diagnosis not present

## 2014-07-01 DIAGNOSIS — Z950 Presence of cardiac pacemaker: Secondary | ICD-10-CM | POA: Diagnosis not present

## 2014-07-01 DIAGNOSIS — E8770 Fluid overload, unspecified: Secondary | ICD-10-CM | POA: Diagnosis not present

## 2014-07-01 DIAGNOSIS — I10 Essential (primary) hypertension: Secondary | ICD-10-CM | POA: Diagnosis not present

## 2014-08-05 DIAGNOSIS — Z9889 Other specified postprocedural states: Secondary | ICD-10-CM | POA: Diagnosis not present

## 2014-08-05 DIAGNOSIS — Z951 Presence of aortocoronary bypass graft: Secondary | ICD-10-CM | POA: Diagnosis not present

## 2014-08-05 DIAGNOSIS — I442 Atrioventricular block, complete: Secondary | ICD-10-CM | POA: Diagnosis not present

## 2014-08-05 DIAGNOSIS — E119 Type 2 diabetes mellitus without complications: Secondary | ICD-10-CM | POA: Diagnosis not present

## 2014-08-05 DIAGNOSIS — I10 Essential (primary) hypertension: Secondary | ICD-10-CM | POA: Diagnosis not present

## 2014-10-14 DIAGNOSIS — H35363 Drusen (degenerative) of macula, bilateral: Secondary | ICD-10-CM | POA: Diagnosis not present

## 2014-10-14 DIAGNOSIS — H43813 Vitreous degeneration, bilateral: Secondary | ICD-10-CM | POA: Diagnosis not present

## 2014-10-14 DIAGNOSIS — E119 Type 2 diabetes mellitus without complications: Secondary | ICD-10-CM | POA: Diagnosis not present

## 2014-10-14 DIAGNOSIS — H3531 Nonexudative age-related macular degeneration: Secondary | ICD-10-CM | POA: Diagnosis not present

## 2014-10-14 DIAGNOSIS — H35372 Puckering of macula, left eye: Secondary | ICD-10-CM | POA: Diagnosis not present

## 2014-10-14 DIAGNOSIS — H33321 Round hole, right eye: Secondary | ICD-10-CM | POA: Diagnosis not present

## 2014-11-15 DIAGNOSIS — Z23 Encounter for immunization: Secondary | ICD-10-CM | POA: Diagnosis not present

## 2014-12-21 DIAGNOSIS — Z79899 Other long term (current) drug therapy: Secondary | ICD-10-CM | POA: Diagnosis not present

## 2014-12-21 DIAGNOSIS — Z0000 Encounter for general adult medical examination without abnormal findings: Secondary | ICD-10-CM | POA: Diagnosis not present

## 2014-12-21 DIAGNOSIS — E119 Type 2 diabetes mellitus without complications: Secondary | ICD-10-CM | POA: Diagnosis not present

## 2014-12-21 DIAGNOSIS — E7800 Pure hypercholesterolemia, unspecified: Secondary | ICD-10-CM | POA: Diagnosis not present

## 2015-02-15 DIAGNOSIS — I5031 Acute diastolic (congestive) heart failure: Secondary | ICD-10-CM | POA: Diagnosis not present

## 2015-02-15 DIAGNOSIS — E7800 Pure hypercholesterolemia, unspecified: Secondary | ICD-10-CM | POA: Diagnosis not present

## 2015-02-15 DIAGNOSIS — I442 Atrioventricular block, complete: Secondary | ICD-10-CM | POA: Diagnosis not present

## 2015-02-15 DIAGNOSIS — I10 Essential (primary) hypertension: Secondary | ICD-10-CM | POA: Diagnosis not present

## 2015-02-15 DIAGNOSIS — Z950 Presence of cardiac pacemaker: Secondary | ICD-10-CM | POA: Diagnosis not present

## 2015-02-15 DIAGNOSIS — N183 Chronic kidney disease, stage 3 (moderate): Secondary | ICD-10-CM | POA: Diagnosis not present

## 2015-02-15 DIAGNOSIS — Z9889 Other specified postprocedural states: Secondary | ICD-10-CM | POA: Diagnosis not present

## 2015-02-15 DIAGNOSIS — Z951 Presence of aortocoronary bypass graft: Secondary | ICD-10-CM | POA: Diagnosis not present

## 2015-06-09 DIAGNOSIS — L570 Actinic keratosis: Secondary | ICD-10-CM | POA: Diagnosis not present

## 2015-06-09 DIAGNOSIS — C4441 Basal cell carcinoma of skin of scalp and neck: Secondary | ICD-10-CM | POA: Diagnosis not present

## 2015-06-09 DIAGNOSIS — D485 Neoplasm of uncertain behavior of skin: Secondary | ICD-10-CM | POA: Diagnosis not present

## 2015-06-09 DIAGNOSIS — L821 Other seborrheic keratosis: Secondary | ICD-10-CM | POA: Diagnosis not present

## 2015-06-09 DIAGNOSIS — L820 Inflamed seborrheic keratosis: Secondary | ICD-10-CM | POA: Diagnosis not present

## 2015-06-28 DIAGNOSIS — E7800 Pure hypercholesterolemia, unspecified: Secondary | ICD-10-CM | POA: Diagnosis not present

## 2015-06-28 DIAGNOSIS — I10 Essential (primary) hypertension: Secondary | ICD-10-CM | POA: Diagnosis not present

## 2015-06-28 DIAGNOSIS — Z79899 Other long term (current) drug therapy: Secondary | ICD-10-CM | POA: Diagnosis not present

## 2015-06-28 DIAGNOSIS — N183 Chronic kidney disease, stage 3 (moderate): Secondary | ICD-10-CM | POA: Diagnosis not present

## 2015-06-28 DIAGNOSIS — E119 Type 2 diabetes mellitus without complications: Secondary | ICD-10-CM | POA: Diagnosis not present

## 2015-07-06 DIAGNOSIS — C4441 Basal cell carcinoma of skin of scalp and neck: Secondary | ICD-10-CM | POA: Diagnosis not present

## 2015-08-16 DIAGNOSIS — J410 Simple chronic bronchitis: Secondary | ICD-10-CM | POA: Diagnosis not present

## 2015-08-16 DIAGNOSIS — Z9889 Other specified postprocedural states: Secondary | ICD-10-CM | POA: Diagnosis not present

## 2015-08-16 DIAGNOSIS — Z45010 Encounter for checking and testing of cardiac pacemaker pulse generator [battery]: Secondary | ICD-10-CM | POA: Diagnosis not present

## 2015-08-16 DIAGNOSIS — N183 Chronic kidney disease, stage 3 (moderate): Secondary | ICD-10-CM | POA: Diagnosis not present

## 2015-08-16 DIAGNOSIS — Z951 Presence of aortocoronary bypass graft: Secondary | ICD-10-CM | POA: Diagnosis not present

## 2015-08-16 DIAGNOSIS — I10 Essential (primary) hypertension: Secondary | ICD-10-CM | POA: Diagnosis not present

## 2015-08-16 DIAGNOSIS — I5031 Acute diastolic (congestive) heart failure: Secondary | ICD-10-CM | POA: Diagnosis not present

## 2015-08-16 DIAGNOSIS — Z950 Presence of cardiac pacemaker: Secondary | ICD-10-CM | POA: Diagnosis not present

## 2015-08-16 DIAGNOSIS — E7800 Pure hypercholesterolemia, unspecified: Secondary | ICD-10-CM | POA: Diagnosis not present

## 2015-08-16 DIAGNOSIS — E1121 Type 2 diabetes mellitus with diabetic nephropathy: Secondary | ICD-10-CM | POA: Diagnosis not present

## 2015-08-23 ENCOUNTER — Telehealth: Payer: Self-pay | Admitting: Family Medicine

## 2015-08-23 NOTE — Telephone Encounter (Signed)
Pt has an appointment to [facility]. New Patient care with Dr. Caryl Bis on July 14, at 10:45. He is going to run out of his prescriptions. What should patient do? I made a copy of his list of meds. Will give to you.

## 2015-08-23 NOTE — Telephone Encounter (Signed)
I will need the med list and I can request a refill for him and see if Dr. Caryl Bis will fill.  I can't say for sure that he will.  Patient might need to get past provider to fill for one more month. thanks

## 2015-08-25 NOTE — Telephone Encounter (Signed)
Med List provided:   Needs refills on  Simivastatin Glipizide Metoprolol Protonix Potassium Aspirin 81mg  Centrum silver  Please advise if you will refill prior to seeing him?

## 2015-08-25 NOTE — Telephone Encounter (Signed)
The patient will need to request these from his prior physician as I have not seen him in the office. Thanks.

## 2015-08-26 NOTE — Telephone Encounter (Signed)
Patient notified.  Verbalized understanding. 

## 2015-09-09 ENCOUNTER — Encounter: Payer: Self-pay | Admitting: Family Medicine

## 2015-09-09 ENCOUNTER — Ambulatory Visit: Payer: Medicare Other | Admitting: Family Medicine

## 2015-09-09 ENCOUNTER — Ambulatory Visit (INDEPENDENT_AMBULATORY_CARE_PROVIDER_SITE_OTHER): Payer: Medicare Other | Admitting: Family Medicine

## 2015-09-09 VITALS — BP 138/64 | HR 65 | Temp 98.2°F | Ht 70.0 in | Wt 171.0 lb

## 2015-09-09 DIAGNOSIS — I2510 Atherosclerotic heart disease of native coronary artery without angina pectoris: Secondary | ICD-10-CM

## 2015-09-09 DIAGNOSIS — E119 Type 2 diabetes mellitus without complications: Secondary | ICD-10-CM | POA: Diagnosis not present

## 2015-09-09 DIAGNOSIS — E785 Hyperlipidemia, unspecified: Secondary | ICD-10-CM | POA: Diagnosis not present

## 2015-09-09 NOTE — Assessment & Plan Note (Signed)
Patient with asymptomatic CAD. Followed by cardiology. He'll continue to monitor. Continue current medications.

## 2015-09-09 NOTE — Progress Notes (Signed)
Patient ID: TYRONN ONNEN, male   DOB: [birth year], 82 y.o.   MRN: [id number]  Tommi Rumps, MD Phone: [phone]  Vincent Jackson is a 82 y.o. male who presents today for new patient visit.  Patient is remarkably healthy 82 year old male. Presents for new patient visit with no specific complaints.  DIABETES Disease Monitoring: Blood Sugar ranges-does not check Polyuria/phagia/dipsia- no      Visual problems- no Medications: Compliance- taking glipizide Hypoglycemic symptoms- no  HYPERLIPIDEMIA Symptoms Chest pain on exertion:  No   Medications: Compliance- taking simvastatin Right upper quadrant pain- no  Muscle aches- no  CAD: Followed by cardiology every 6 months. No chest pain or shortness of breath. Has a pacemaker in place for sick sinus syndrome. Takes metoprolol. No edema or palpitations. Takes an aspirin daily.    Active Ambulatory Problems    Diagnosis Date Noted  . CAD (coronary artery disease) 09/09/2015  . Hyperlipidemia 09/09/2015  . Diabetes ([place]) 09/09/2015   Resolved Ambulatory Problems    Diagnosis Date Noted  . No Resolved Ambulatory Problems   Past Medical History  Diagnosis Date  . Diabetes mellitus without complication ([place])   . Cancer ([place])   . Prostate cancer ([place])   . Diastolic heart failure ([facility])     Family History  Problem Relation Age of Onset  . Cataracts    . Diabetes      Social History   Social History  . Marital Status: Married    Spouse Name: N/A  . Number of Children: N/A  . Years of Education: N/A   Occupational History  . Not on file.   Social History Main Topics  . Smoking status: Former Smoker    Quit date: 09/08/1997  . Smokeless tobacco: Not on file  . Alcohol Use: 0.0 oz/week    0 Standard drinks or equivalent per week     Comment: Occasional   . Drug Use: No  . Sexual Activity: Not on file   Other Topics Concern  . Not on file   Social History Narrative  . No narrative on file     ROS  General:  Negative for nexplained weight loss, fever Skin: Negative for new or changing mole, sore that won't heal HEENT: Negative for trouble hearing, trouble seeing, ringing in ears, mouth sores, hoarseness, change in voice, dysphagia. CV:  Negative for chest pain, dyspnea, edema, palpitations Resp: Negative for cough, dyspnea, hemoptysis GI: Negative for nausea, vomiting, diarrhea, constipation, abdominal pain, melena, hematochezia. GU: Negative for dysuria, incontinence, urinary hesitance, hematuria, vaginal or penile discharge, polyuria, sexual difficulty, lumps in testicle or breasts MSK: Negative for muscle cramps or aches, joint pain or swelling Neuro: Negative for headaches, weakness, numbness, dizziness, passing out/fainting Psych: Negative for depression, anxiety, memory problems  Objective  Physical Exam Filed Vitals:   09/09/15 1040  BP: 138/64  Pulse: 65  Temp: 98.2 F (36.8 C)    BP Readings from Last 3 Encounters:  09/09/15 138/64   Wt Readings from Last 3 Encounters:  09/09/15 171 lb (77.565 kg)    Physical Exam  Constitutional: He is well-developed, well-nourished, and in no distress.  HENT:  Head: Normocephalic and atraumatic.  Mouth/Throat: Oropharynx is clear and moist.  Eyes: Conjunctivae are normal. Pupils are equal, round, and reactive to light.  Cardiovascular: Normal rate, regular rhythm and normal heart sounds.   Pulmonary/Chest: Effort normal and breath sounds normal.  Abdominal: Soft. Bowel sounds are normal. He exhibits no distension. There is no tenderness.  There is no rebound and no guarding.  Musculoskeletal: He exhibits no edema.  Neurological: He is alert. Gait normal.  Skin: Skin is warm and dry.  Psychiatric: Mood and affect normal.     Assessment/Plan:   CAD (coronary artery disease) Patient with asymptomatic CAD. Followed by cardiology. He'll continue to monitor. Continue current  medications.  Hyperlipidemia Tolerating statin. Recent CMP and care everywhere reviewed. Continue statin.  Diabetes ([place]) Well-controlled. Recent A1c reviewed in care everywhere. Continue glipizide.    Tommi Rumps, MD [place]

## 2015-09-09 NOTE — Patient Instructions (Signed)
Nice to meet you. Please continue your current medications. Please keep your follow-up with cardiology. We will have you come back in October for blood work. If you develop any new symptoms please let us know.

## 2015-09-09 NOTE — Assessment & Plan Note (Signed)
Well-controlled. Recent A1c reviewed in care everywhere. Continue glipizide.

## 2015-09-09 NOTE — Assessment & Plan Note (Addendum)
Tolerating statin. Recent CMP and care everywhere reviewed. Continue statin.

## 2015-09-09 NOTE — Progress Notes (Signed)
Pre visit review using our clinic review tool, if applicable. No additional management support is needed unless otherwise documented below in the visit note. 

## 2015-09-14 ENCOUNTER — Ambulatory Visit: Payer: Medicare Other | Admitting: Family Medicine

## 2015-09-22 DIAGNOSIS — C4441 Basal cell carcinoma of skin of scalp and neck: Secondary | ICD-10-CM | POA: Diagnosis not present

## 2015-10-20 DIAGNOSIS — H43813 Vitreous degeneration, bilateral: Secondary | ICD-10-CM | POA: Diagnosis not present

## 2015-10-20 DIAGNOSIS — H33321 Round hole, right eye: Secondary | ICD-10-CM | POA: Diagnosis not present

## 2015-10-20 DIAGNOSIS — H35363 Drusen (degenerative) of macula, bilateral: Secondary | ICD-10-CM | POA: Diagnosis not present

## 2015-10-20 DIAGNOSIS — H353190 Nonexudative age-related macular degeneration, unspecified eye, stage unspecified: Secondary | ICD-10-CM | POA: Diagnosis not present

## 2015-10-20 DIAGNOSIS — H35372 Puckering of macula, left eye: Secondary | ICD-10-CM | POA: Diagnosis not present

## 2015-10-20 DIAGNOSIS — E119 Type 2 diabetes mellitus without complications: Secondary | ICD-10-CM | POA: Diagnosis not present

## 2015-11-30 DIAGNOSIS — Z23 Encounter for immunization: Secondary | ICD-10-CM | POA: Diagnosis not present

## 2015-12-16 ENCOUNTER — Ambulatory Visit (INDEPENDENT_AMBULATORY_CARE_PROVIDER_SITE_OTHER): Payer: Medicare Other | Admitting: Family Medicine

## 2015-12-16 ENCOUNTER — Encounter: Payer: Self-pay | Admitting: Family Medicine

## 2015-12-16 VITALS — BP 134/74 | HR 78 | Temp 98.3°F | Wt 170.1 lb

## 2015-12-16 DIAGNOSIS — I2510 Atherosclerotic heart disease of native coronary artery without angina pectoris: Secondary | ICD-10-CM

## 2015-12-16 DIAGNOSIS — Z8551 Personal history of malignant neoplasm of bladder: Secondary | ICD-10-CM

## 2015-12-16 DIAGNOSIS — E119 Type 2 diabetes mellitus without complications: Secondary | ICD-10-CM | POA: Diagnosis not present

## 2015-12-16 DIAGNOSIS — E785 Hyperlipidemia, unspecified: Secondary | ICD-10-CM

## 2015-12-16 DIAGNOSIS — R0989 Other specified symptoms and signs involving the circulatory and respiratory systems: Secondary | ICD-10-CM | POA: Diagnosis not present

## 2015-12-16 LAB — BASIC METABOLIC PANEL
BUN: 32 mg/dL — AB (ref 6–23)
CALCIUM: 9.1 mg/dL (ref 8.4–10.5)
CO2: 28 meq/L (ref 19–32)
CREATININE: 1.57 mg/dL — AB (ref 0.40–1.50)
Chloride: 104 mEq/L (ref 96–112)
GFR: 43.94 mL/min — ABNORMAL LOW (ref >60.00)
Glucose, Bld: 181 mg/dL — ABNORMAL HIGH (ref 70–99)
Potassium: 5.1 mEq/L (ref 3.5–5.1)
Sodium: 138 mEq/L (ref 135–145)

## 2015-12-16 LAB — HEMOGLOBIN A1C: HEMOGLOBIN A1C: 7.5 % — AB (ref 4.6–6.5)

## 2015-12-16 NOTE — Assessment & Plan Note (Signed)
Patient is status post removal of his bladder and prostate per his report. Stoma appears healthy. Draining yellow urine. He'll continue to monitor.

## 2015-12-16 NOTE — Assessment & Plan Note (Signed)
Asymptomatic. Check A1c today. Foot exam completed with diminished pulses. ABIs will be ordered. Also with calluses and flaking. Discussed sending him to podiatry though he declined this. Advised of foot precautions.

## 2015-12-16 NOTE — Assessment & Plan Note (Signed)
Diminished pulses bilaterally in feet. We will obtain ABIs.

## 2015-12-16 NOTE — Progress Notes (Signed)
  Tommi Rumps, MD Phone: [phone]  Vincent Jackson is a 82 y.o. male who presents today for follow-up.  DIABETES Disease Monitoring: Blood Sugar ranges-not checking Polyuria/phagia/dipsia- no      ophthalmology- reports he saw 2 months ago Medications: Compliance- taking glipizide Hypoglycemic symptoms- no Walks for exercise. Has decreased his sugar intake.  Hyperlipidemia: Taking simvastatin. No chest pain, right upper quadrant pain, or myalgias.  CAD: No chest pain, shortness of breath, palpitations, orthopnea. He notes some minimal ankle edema that improves with propping his feet up. Taking metoprolol for this. Follows with cardiology.  History of bladder cancer: Patient notes he had his bladder removed and his prostate removed in the past. He has a stoma in his right lower quadrant and uses a bag to collect urine. He notes no abdominal pain.   PMH: Former smoker.   ROS see history of present illness  Objective  Physical Exam Vitals:   12/16/15 1351  BP: 134/74  Pulse: 78  Temp: 98.3 F (36.8 C)    BP Readings from Last 3 Encounters:  12/16/15 134/74  09/09/15 138/64   Wt Readings from Last 3 Encounters:  12/16/15 170 lb 2 oz (77.2 kg)  09/09/15 171 lb (77.6 kg)    Physical Exam  Constitutional: He is well-developed, well-nourished, and in no distress.  Cardiovascular: Normal rate, regular rhythm and normal heart sounds.   Pulmonary/Chest: Effort normal and breath sounds normal.  Abdominal: Soft. He exhibits no distension. There is no tenderness.  Stoma and right lower quadrant is pink and appears healthy, yellow urine in his bag  Musculoskeletal: He exhibits no edema.  Neurological: He is alert.  Skin: Skin is warm and dry.   Diabetic Foot Exam - Simple   Simple Foot Form Diabetic Foot exam was performed with the following findings:  Yes 12/16/2015  2:16 PM  Visual Inspection See comments:  Yes Sensation Testing Intact to touch and monofilament  testing bilaterally:  Yes Pulse Check See comments:  Yes Comments Left and right DP pulses difficult to palpate though present, PT pulses unable to palpate, patient has skin flaking and callus buildup on a number of locations particularly the lateral aspect of bilateral great toes     Assessment/Plan: Please see individual problem list.  CAD (coronary artery disease) Asymptomatic. Continue to monitor. Follow up with cardiology as scheduled.  Diabetes ([place]) Asymptomatic. Check A1c today. Foot exam completed with diminished pulses. ABIs will be ordered. Also with calluses and flaking. Discussed sending him to podiatry though he declined this. Advised of foot precautions.  Hyperlipidemia Tolerating statin. We will continue this.  Decreased pedal pulses Diminished pulses bilaterally in feet. We will obtain ABIs.  History of bladder cancer Patient is status post removal of his bladder and prostate per his report. Stoma appears healthy. Draining yellow urine. He'll continue to monitor.   Orders Placed This Encounter  Procedures  . Basic Metabolic Panel (BMET)  . HgB A1c  . Ambulatory referral to Vascular Surgery    Referral Priority:   Routine    Referral Type:   Surgical    Referral Reason:   Specialty Services Required    Requested Specialty:   Vascular Surgery    Number of Visits Requested:   1    Tommi Rumps, MD [place]

## 2015-12-16 NOTE — Progress Notes (Signed)
Pre visit review using our clinic review tool, if applicable. No additional management support is needed unless otherwise documented below in the visit note. 

## 2015-12-16 NOTE — Patient Instructions (Signed)
Nice to see you. Please continue to take your current medications. We will check some lab work and call you with the results.

## 2015-12-16 NOTE — Assessment & Plan Note (Signed)
Asymptomatic. Continue to monitor. Follow up with cardiology as scheduled.

## 2015-12-16 NOTE — Assessment & Plan Note (Signed)
Tolerating statin. We will continue this.

## 2015-12-20 ENCOUNTER — Telehealth: Payer: Self-pay | Admitting: Family Medicine

## 2015-12-20 NOTE — Telephone Encounter (Signed)
Pt called back returning your call. Thank you!  Call pt @ [phone]

## 2016-01-02 ENCOUNTER — Other Ambulatory Visit: Payer: Self-pay | Admitting: Family Medicine

## 2016-01-02 DIAGNOSIS — M79604 Pain in right leg: Secondary | ICD-10-CM

## 2016-01-02 DIAGNOSIS — M79605 Pain in left leg: Principal | ICD-10-CM

## 2016-01-04 ENCOUNTER — Encounter (INDEPENDENT_AMBULATORY_CARE_PROVIDER_SITE_OTHER): Payer: Medicare Other

## 2016-01-12 ENCOUNTER — Ambulatory Visit (INDEPENDENT_AMBULATORY_CARE_PROVIDER_SITE_OTHER): Payer: Medicare Other

## 2016-01-12 DIAGNOSIS — M79605 Pain in left leg: Secondary | ICD-10-CM

## 2016-01-12 DIAGNOSIS — M79604 Pain in right leg: Secondary | ICD-10-CM | POA: Diagnosis not present

## 2016-01-14 ENCOUNTER — Other Ambulatory Visit: Payer: Self-pay | Admitting: Family Medicine

## 2016-01-14 DIAGNOSIS — R6889 Other general symptoms and signs: Secondary | ICD-10-CM

## 2016-01-16 ENCOUNTER — Telehealth: Payer: Self-pay | Admitting: Family Medicine

## 2016-01-16 NOTE — Telephone Encounter (Signed)
Please advise 

## 2016-01-16 NOTE — Telephone Encounter (Signed)
Pt called wanting to know if it's necessary  for pt to go to the appt on 01/24/16 to the vascular office. Please advise?  Call pt @ [phone]. Thank you!

## 2016-01-16 NOTE — Telephone Encounter (Signed)
Notified patient of Dr. Ellen Henri message. Patient stated that he will go

## 2016-01-16 NOTE — Telephone Encounter (Signed)
I would advise him to see vascular surgery. Thanks.

## 2016-01-24 ENCOUNTER — Ambulatory Visit (INDEPENDENT_AMBULATORY_CARE_PROVIDER_SITE_OTHER): Payer: Medicare Other | Admitting: Vascular Surgery

## 2016-01-24 ENCOUNTER — Encounter (INDEPENDENT_AMBULATORY_CARE_PROVIDER_SITE_OTHER): Payer: Self-pay | Admitting: Vascular Surgery

## 2016-01-24 VITALS — BP 141/67 | HR 68 | Resp 16 | Ht 72.0 in | Wt 170.0 lb

## 2016-01-24 DIAGNOSIS — I739 Peripheral vascular disease, unspecified: Secondary | ICD-10-CM | POA: Insufficient documentation

## 2016-01-24 DIAGNOSIS — I2510 Atherosclerotic heart disease of native coronary artery without angina pectoris: Secondary | ICD-10-CM

## 2016-01-24 DIAGNOSIS — E119 Type 2 diabetes mellitus without complications: Secondary | ICD-10-CM

## 2016-01-24 DIAGNOSIS — E785 Hyperlipidemia, unspecified: Secondary | ICD-10-CM | POA: Diagnosis not present

## 2016-01-24 NOTE — Assessment & Plan Note (Signed)
lipid control important in reducing the progression of atherosclerotic disease. Continue statin therapy  

## 2016-01-24 NOTE — Assessment & Plan Note (Signed)
blood glucose control important in reducing the progression of atherosclerotic disease. Also, involved in wound healing. On appropriate medications.  

## 2016-01-24 NOTE — Assessment & Plan Note (Signed)
The patient had noninvasive studies which demonstrated reduced ABI on the right 0.78 and a normal ABI on the left of 1. He does not have any lifestyle limiting claudication, ischemic rest pain, or ulceration. His situation is a stable, non-limb threatening situation area given the possibility of symptoms currently, I would not recommend any intervention. He is on appropriate medical therapy with aspirin and a statin agent. I have recommended repeat evaluation with noninvasive studies in about 6 months for monitoring of his peripheral vascular disease. Should he develop any worsening symptoms, he should contact our office in the interim.

## 2016-01-24 NOTE — Progress Notes (Signed)
Patient ID: Vincent Jackson, male   DOB: 1919/11/22, 82 y.o.   MRN: [id number]  Chief Complaint  Patient presents with  . New Evaluation    Had abnormal ABI    HPI Vincent Jackson is a 82 y.o. male.  I am asked to see the patient by Dr. Caryl Bis for evaluation of peripheral vascular disease.  The patient reports no major claudication, ischemic rest pain, or ulceration symptoms. His primary care physician on a diligent physical exam noticed diminished pedal pulses which prompted further evaluations with noninvasive studies. This demonstrated a right ABI that was moderately reduced at 0.78 and a normal left ABI of 1. His digital pressure was normal on the left and moderately reduced on the right as well. He denies any previous surgical or endovascular treatment of peripheral vascular disease. He has no specific complaints today   Past Medical History:  Diagnosis Date  . CAD (coronary artery disease)   . Cancer [facility])    Bladder cancer   . Diabetes mellitus without complication ([place])   . Diastolic heart failure ([place])   . Hyperlipidemia   . Prostate cancer [facility])     Past Surgical History:  Procedure Laterality Date  . BLADDER REMOVAL  2007  . MASTOIDECTOMY    . PROSTATE SURGERY  2007   removed   . TONSILLECTOMY      Family History  Problem Relation Age of Onset  . Cataracts    . Diabetes     No aneurysms.  Social History Social History  Substance Use Topics  . Smoking status: Former Smoker    Quit date: 09/08/1997  . Smokeless tobacco: Not on file  . Alcohol use 0.0 oz/week     Comment: Occasional     No Known Allergies  Current Outpatient Prescriptions  Medication Sig Dispense Refill  . aspirin EC 81 MG tablet Take by mouth.    . furosemide (LASIX) 20 MG tablet Take by mouth.    Marland Kitchen glipiZIDE (GLUCOTROL XL) 10 MG 24 hr tablet Take by mouth.    . latanoprost (XALATAN) 0.005 % ophthalmic solution PLACE ONE DROP INTO EACH EYE EVERY NIGHT    . metoprolol  succinate (TOPROL-XL) 25 MG 24 hr tablet     . Multiple Vitamin (MULTI-VITAMINS) TABS Take by mouth.    . pantoprazole (PROTONIX) 40 MG tablet     . potassium chloride (K-DUR,KLOR-CON) 10 MEQ tablet     . simvastatin (ZOCOR) 40 MG tablet      No current facility-administered medications for this visit.       REVIEW OF SYSTEMS (Negative unless checked)  Constitutional: [] Weight loss  [] Fever  [] Chills Cardiac: [] Chest pain   [] Chest pressure   [] Palpitations   [] Shortness of breath when laying flat   [] Shortness of breath at rest   [] Shortness of breath with exertion. Vascular:  [] Pain in legs with walking   [] Pain in legs at rest   [] Pain in legs when laying flat   [] Claudication   [] Pain in feet when walking  [] Pain in feet at rest  [] Pain in feet when laying flat   [] History of DVT   [] Phlebitis   [] Swelling in legs   [] Varicose veins   [] Non-healing ulcers Pulmonary:   [] Uses home oxygen   [] Productive cough   [] Hemoptysis   [] Wheeze  [] COPD   [] Asthma Neurologic:  [] Dizziness  [] Blackouts   [] Seizures   [] History of stroke   [] History of TIA  [] Aphasia   [] Temporary  blindness   [] Dysphagia   [] Weakness or numbness in arms   [] Weakness or numbness in legs Musculoskeletal:  [x] Arthritis   [] Joint swelling   [] Joint pain   [] Low back pain Hematologic:  [] Easy bruising  [] Easy bleeding   [] Hypercoagulable state   [] Anemic  [] Hepatitis Gastrointestinal:  [] Blood in stool   [] Vomiting blood  [] Gastroesophageal reflux/heartburn   [] Abdominal pain Genitourinary:  [] Chronic kidney disease   [] Difficult urination  [] Frequent urination  [] Burning with urination   [] Hematuria Skin:  [] Rashes   [] Ulcers   [] Wounds Psychological:  [] History of anxiety   []  History of major depression.    Physical Exam BP (!) 141/67 (BP Location: Right Arm)   Pulse 68   Resp 16   Ht 6' (1.829 m)   Wt 170 lb (77.1 kg)   BMI 23.06 kg/m  Gen:  WD/WN, NAD. Appears younger than stated age Head: [place]/AT, No  temporalis wasting. Prominent temp pulse not noted. Ear/Nose/Throat: Hearing grossly intact, nares w/o erythema or drainage, oropharynx w/o Erythema/Exudate Eyes: Conjunctiva clear, sclera non-icteric  Neck: trachea midline.  No bruit or JVD.  Pulmonary:  Good air movement, clear to auscultation bilaterally.  Cardiac: RRR, normal S1, S2, no Murmurs, rubs or gallops. Vascular:  Vessel Right Left  Radial Palpable Palpable  Ulnar Palpable Palpable  Brachial Palpable Palpable  Carotid Palpable, without bruit Palpable, without bruit  Aorta Not palpable N/A  Femoral Palpable Palpable  Popliteal Not Palpable Palpable  PT 1+ Palpable Palpable  DP Trace Palpable 1+ Palpable   Gastrointestinal: soft, non-tender/non-distended. No guarding/reflex. No masses, surgical incisions, or scars. Musculoskeletal: M/S 5/5 throughout.  Extremities without ischemic changes.  Arthritic changes present Neurologic: Sensation grossly intact in extremities.  Symmetrical.  Speech is fluent. Motor exam as listed above. Psychiatric: Judgment intact, Mood & affect appropriate for pt's clinical situation. Dermatologic: No rashes or ulcers noted.  No cellulitis or open wounds. Lymph : No Cervical, Axillary, or Inguinal lymphadenopathy.   Radiology No results found.  Labs Recent Results (from the past 2160 hour(s))  Basic Metabolic Panel (BMET)     Status: Abnormal   Collection Time: 12/16/15  2:10 PM  Result Value Ref Range   Sodium 138 135 - 145 mEq/L   Potassium 5.1 3.5 - 5.1 mEq/L   Chloride 104 96 - 112 mEq/L   CO2 28 19 - 32 mEq/L   Glucose, Bld 181 (H) 70 - 99 mg/dL   BUN 32 (H) 6 - 23 mg/dL   Creatinine, Ser 1.57 (H) 0.40 - 1.50 mg/dL   Calcium 9.1 8.4 - 10.5 mg/dL   GFR 43.94 (L) >60.00 mL/min  HgB A1c     Status: Abnormal   Collection Time: 12/16/15  2:10 PM  Result Value Ref Range   Hgb A1c MFr Bld 7.5 (H) 4.6 - 6.5 %    Comment: Glycemic Control Guidelines for People with Diabetes:Non  Diabetic:  <6%Goal of Therapy: <7%Additional Action Suggested:  >8%     Assessment/Plan:  Hyperlipidemia lipid control important in reducing the progression of atherosclerotic disease. Continue statin therapy   Diabetes ([place]) blood glucose control important in reducing the progression of atherosclerotic disease. Also, involved in wound healing. On appropriate medications.   PVD (peripheral vascular disease) ([place]) The patient had noninvasive studies which demonstrated reduced ABI on the right 0.78 and a normal ABI on the left of 1. He does not have any lifestyle limiting claudication, ischemic rest pain, or ulceration. His situation is a stable, non-limb threatening situation  area given the possibility of symptoms currently, I would not recommend any intervention. He is on appropriate medical therapy with aspirin and a statin agent. I have recommended repeat evaluation with noninvasive studies in about 6 months for monitoring of his peripheral vascular disease. Should he develop any worsening symptoms, he should contact our office in the interim.      Leotis Pain 01/24/2016, 11:04 AM   This note was created with Dragon medical transcription system.  Any errors from dictation are unintentional.

## 2016-02-14 DIAGNOSIS — E7800 Pure hypercholesterolemia, unspecified: Secondary | ICD-10-CM | POA: Diagnosis not present

## 2016-02-14 DIAGNOSIS — J410 Simple chronic bronchitis: Secondary | ICD-10-CM | POA: Diagnosis not present

## 2016-02-14 DIAGNOSIS — I5031 Acute diastolic (congestive) heart failure: Secondary | ICD-10-CM | POA: Diagnosis not present

## 2016-02-14 DIAGNOSIS — E1121 Type 2 diabetes mellitus with diabetic nephropathy: Secondary | ICD-10-CM | POA: Diagnosis not present

## 2016-02-14 DIAGNOSIS — Z950 Presence of cardiac pacemaker: Secondary | ICD-10-CM | POA: Diagnosis not present

## 2016-02-14 DIAGNOSIS — I442 Atrioventricular block, complete: Secondary | ICD-10-CM | POA: Diagnosis not present

## 2016-02-14 DIAGNOSIS — I350 Nonrheumatic aortic (valve) stenosis: Secondary | ICD-10-CM | POA: Diagnosis not present

## 2016-02-14 DIAGNOSIS — Z951 Presence of aortocoronary bypass graft: Secondary | ICD-10-CM | POA: Diagnosis not present

## 2016-02-14 DIAGNOSIS — N183 Chronic kidney disease, stage 3 (moderate): Secondary | ICD-10-CM | POA: Diagnosis not present

## 2016-02-14 DIAGNOSIS — Z45010 Encounter for checking and testing of cardiac pacemaker pulse generator [battery]: Secondary | ICD-10-CM | POA: Diagnosis not present

## 2016-02-14 DIAGNOSIS — Z9889 Other specified postprocedural states: Secondary | ICD-10-CM | POA: Diagnosis not present

## 2016-02-14 DIAGNOSIS — I10 Essential (primary) hypertension: Secondary | ICD-10-CM | POA: Diagnosis not present

## 2016-03-12 ENCOUNTER — Emergency Department: Payer: Medicare Other

## 2016-03-12 ENCOUNTER — Observation Stay
Admission: EM | Admit: 2016-03-12 | Discharge: 2016-03-15 | Disposition: A | Discharge status: Home / Self Care | Payer: Medicare Other | Attending: Internal Medicine | Admitting: Internal Medicine

## 2016-03-12 DIAGNOSIS — Z7982 Long term (current) use of aspirin: Secondary | ICD-10-CM | POA: Diagnosis not present

## 2016-03-12 DIAGNOSIS — E1151 Type 2 diabetes mellitus with diabetic peripheral angiopathy without gangrene: Secondary | ICD-10-CM | POA: Diagnosis not present

## 2016-03-12 DIAGNOSIS — Z87891 Personal history of nicotine dependence: Secondary | ICD-10-CM | POA: Insufficient documentation

## 2016-03-12 DIAGNOSIS — A4189 Other specified sepsis: Principal | ICD-10-CM | POA: Insufficient documentation

## 2016-03-12 DIAGNOSIS — Z66 Do not resuscitate: Secondary | ICD-10-CM | POA: Diagnosis not present

## 2016-03-12 DIAGNOSIS — J9601 Acute respiratory failure with hypoxia: Secondary | ICD-10-CM | POA: Diagnosis not present

## 2016-03-12 DIAGNOSIS — R748 Abnormal levels of other serum enzymes: Secondary | ICD-10-CM | POA: Diagnosis not present

## 2016-03-12 DIAGNOSIS — E785 Hyperlipidemia, unspecified: Secondary | ICD-10-CM | POA: Insufficient documentation

## 2016-03-12 DIAGNOSIS — M6281 Muscle weakness (generalized): Secondary | ICD-10-CM

## 2016-03-12 DIAGNOSIS — I5032 Chronic diastolic (congestive) heart failure: Secondary | ICD-10-CM | POA: Diagnosis not present

## 2016-03-12 DIAGNOSIS — Z79899 Other long term (current) drug therapy: Secondary | ICD-10-CM | POA: Insufficient documentation

## 2016-03-12 DIAGNOSIS — J40 Bronchitis, not specified as acute or chronic: Secondary | ICD-10-CM | POA: Diagnosis not present

## 2016-03-12 DIAGNOSIS — J101 Influenza due to other identified influenza virus with other respiratory manifestations: Secondary | ICD-10-CM | POA: Insufficient documentation

## 2016-03-12 DIAGNOSIS — I2510 Atherosclerotic heart disease of native coronary artery without angina pectoris: Secondary | ICD-10-CM | POA: Diagnosis not present

## 2016-03-12 DIAGNOSIS — E1122 Type 2 diabetes mellitus with diabetic chronic kidney disease: Secondary | ICD-10-CM | POA: Diagnosis not present

## 2016-03-12 DIAGNOSIS — R0600 Dyspnea, unspecified: Secondary | ICD-10-CM | POA: Diagnosis not present

## 2016-03-12 DIAGNOSIS — N183 Chronic kidney disease, stage 3 (moderate): Secondary | ICD-10-CM | POA: Diagnosis not present

## 2016-03-12 DIAGNOSIS — R05 Cough: Secondary | ICD-10-CM | POA: Diagnosis not present

## 2016-03-12 DIAGNOSIS — R0602 Shortness of breath: Secondary | ICD-10-CM | POA: Diagnosis not present

## 2016-03-12 DIAGNOSIS — R0902 Hypoxemia: Secondary | ICD-10-CM

## 2016-03-12 DIAGNOSIS — Z7984 Long term (current) use of oral hypoglycemic drugs: Secondary | ICD-10-CM | POA: Diagnosis not present

## 2016-03-12 DIAGNOSIS — Z950 Presence of cardiac pacemaker: Secondary | ICD-10-CM | POA: Diagnosis not present

## 2016-03-12 DIAGNOSIS — J111 Influenza due to unidentified influenza virus with other respiratory manifestations: Secondary | ICD-10-CM | POA: Diagnosis present

## 2016-03-12 DIAGNOSIS — R778 Other specified abnormalities of plasma proteins: Secondary | ICD-10-CM | POA: Insufficient documentation

## 2016-03-12 DIAGNOSIS — B9789 Other viral agents as the cause of diseases classified elsewhere: Secondary | ICD-10-CM

## 2016-03-12 DIAGNOSIS — R059 Cough, unspecified: Secondary | ICD-10-CM

## 2016-03-12 DIAGNOSIS — E119 Type 2 diabetes mellitus without complications: Secondary | ICD-10-CM | POA: Diagnosis not present

## 2016-03-12 LAB — CBC WITH DIFFERENTIAL/PLATELET
BASOS ABS: 0 10*3/uL (ref 0–0.1)
Basophils Relative: 1 %
EOS PCT: 1 %
Eosinophils Absolute: 0 10*3/uL (ref 0–0.7)
HCT: 30.6 % — ABNORMAL LOW (ref 40.0–52.0)
Hemoglobin: 10.6 g/dL — ABNORMAL LOW (ref 13.0–18.0)
LYMPHS ABS: 0.5 10*3/uL — AB (ref 1.0–3.6)
Lymphocytes Relative: 12 %
MCH: 31.3 pg (ref 26.0–34.0)
MCHC: 34.6 g/dL (ref 32.0–36.0)
MCV: 90.4 fL (ref 80.0–100.0)
Monocytes Absolute: 0.5 10*3/uL (ref 0.2–1.0)
Monocytes Relative: 13 %
NEUTROS ABS: 2.8 10*3/uL (ref 1.4–6.5)
Neutrophils Relative %: 73 %
PLATELETS: 121 10*3/uL — AB (ref 150–440)
RBC: 3.38 MIL/uL — AB (ref 4.40–5.90)
RDW: 13.6 % (ref 11.5–14.5)
WBC: 3.8 10*3/uL (ref 3.8–10.6)

## 2016-03-12 LAB — COMPREHENSIVE METABOLIC PANEL
ALT: 15 U/L — AB (ref 17–63)
AST: 30 U/L (ref 15–41)
Albumin: 4 g/dL (ref 3.5–5.0)
Alkaline Phosphatase: 47 U/L (ref 38–126)
Anion gap: 5 (ref 5–15)
BUN: 24 mg/dL — AB (ref 6–20)
CHLORIDE: 106 mmol/L (ref 101–111)
CO2: 28 mmol/L (ref 22–32)
CREATININE: 1.61 mg/dL — AB (ref 0.61–1.24)
Calcium: 8.8 mg/dL — ABNORMAL LOW (ref 8.9–10.3)
GFR calc non Af Amer: 35 mL/min — ABNORMAL LOW (ref >60)
GFR, EST AFRICAN AMERICAN: 41 mL/min — AB (ref >60)
Glucose, Bld: 58 mg/dL — ABNORMAL LOW (ref 65–99)
POTASSIUM: 4.4 mmol/L (ref 3.5–5.1)
SODIUM: 139 mmol/L (ref 135–145)
Total Bilirubin: 0.7 mg/dL (ref 0.3–1.2)
Total Protein: 6.9 g/dL (ref 6.5–8.1)

## 2016-03-12 LAB — TROPONIN I
TROPONIN I: 0.05 ng/mL — AB (ref <0.03)
Troponin I: 0.06 ng/mL (ref <0.03)

## 2016-03-12 LAB — URINALYSIS, COMPLETE (UACMP) WITH MICROSCOPIC
Bacteria, UA: NONE SEEN
Bilirubin Urine: NEGATIVE
Glucose, UA: NEGATIVE mg/dL
Hgb urine dipstick: NEGATIVE
Ketones, ur: NEGATIVE mg/dL
Leukocytes, UA: NEGATIVE
Nitrite: POSITIVE — AB
Protein, ur: 30 mg/dL — AB
Specific Gravity, Urine: 1.011 (ref 1.005–1.030)
Squamous Epithelial / LPF: NONE SEEN
pH: 9 — ABNORMAL HIGH (ref 5.0–8.0)

## 2016-03-12 LAB — GLUCOSE, CAPILLARY
Glucose-Capillary: 41 mg/dL — CL (ref 65–99)
Glucose-Capillary: 112 mg/dL — ABNORMAL HIGH (ref 65–99)
Glucose-Capillary: 49 mg/dL — ABNORMAL LOW (ref 65–99)
Glucose-Capillary: 79 mg/dL (ref 65–99)
Glucose-Capillary: 50 mg/dL — ABNORMAL LOW (ref 65–99)
Glucose-Capillary: 79 mg/dL (ref 65–99)

## 2016-03-12 LAB — INFLUENZA PANEL BY PCR (TYPE A & B)
INFLBPCR: NEGATIVE
Influenza A By PCR: POSITIVE — AB

## 2016-03-12 LAB — TSH: TSH: 1.529 u[IU]/mL (ref 0.350–4.500)

## 2016-03-12 MED ORDER — PANTOPRAZOLE SODIUM 40 MG PO TBEC
40.0000 mg | DELAYED_RELEASE_TABLET | Freq: Every day | ORAL | Status: DC
  Administered 2016-03-12 – 2016-03-15 (×4): 40 mg via ORAL
  Filled 2016-03-12 (×4): qty 1

## 2016-03-12 MED ORDER — ACETAMINOPHEN 325 MG PO TABS
650.0000 mg | ORAL_TABLET | Freq: Four times a day (QID) | ORAL | Status: DC | PRN
  Administered 2016-03-12 – 2016-03-13 (×2): 650 mg via ORAL
  Filled 2016-03-12 (×2): qty 2

## 2016-03-12 MED ORDER — IPRATROPIUM-ALBUTEROL 0.5-2.5 (3) MG/3ML IN SOLN
3.0000 mL | Freq: Once | RESPIRATORY_TRACT | Status: AC
  Administered 2016-03-12: 3 mL via RESPIRATORY_TRACT
  Filled 2016-03-12: qty 3

## 2016-03-12 MED ORDER — SODIUM CHLORIDE 0.9 % IV SOLN
INTRAVENOUS | Status: DC
  Administered 2016-03-12: 125 mL/h via INTRAVENOUS
  Administered 2016-03-12: 13:00:00 via INTRAVENOUS
  Administered 2016-03-13: 125 mL/h via INTRAVENOUS

## 2016-03-12 MED ORDER — METOPROLOL SUCCINATE ER 25 MG PO TB24
25.0000 mg | ORAL_TABLET | Freq: Every day | ORAL | Status: DC
  Administered 2016-03-12 – 2016-03-15 (×4): 25 mg via ORAL
  Filled 2016-03-12 (×4): qty 1

## 2016-03-12 MED ORDER — OSELTAMIVIR PHOSPHATE 30 MG PO CAPS
30.0000 mg | ORAL_CAPSULE | Freq: Two times a day (BID) | ORAL | Status: DC
  Administered 2016-03-12 – 2016-03-15 (×7): 30 mg via ORAL
  Filled 2016-03-12 (×7): qty 1

## 2016-03-12 MED ORDER — SIMVASTATIN 40 MG PO TABS
40.0000 mg | ORAL_TABLET | Freq: Every day | ORAL | Status: DC
  Administered 2016-03-12 – 2016-03-14 (×3): 40 mg via ORAL
  Filled 2016-03-12 (×3): qty 1

## 2016-03-12 MED ORDER — ONDANSETRON HCL 4 MG PO TABS: 4.0000 mg | ORAL_TABLET | Freq: Four times a day (QID) | ORAL | Status: DC | PRN

## 2016-03-12 MED ORDER — INSULIN ASPART 100 UNIT/ML ~~LOC~~ SOLN: 0.0000 [IU] | Freq: Every day | SUBCUTANEOUS | Status: DC

## 2016-03-12 MED ORDER — LATANOPROST 0.005 % OP SOLN
1.0000 [drp] | Freq: Every day | OPHTHALMIC | Status: DC
  Administered 2016-03-12 – 2016-03-14 (×3): 1 [drp] via OPHTHALMIC
  Filled 2016-03-12: qty 2.5

## 2016-03-12 MED ORDER — DOCUSATE SODIUM 100 MG PO CAPS
100.0000 mg | ORAL_CAPSULE | Freq: Two times a day (BID) | ORAL | Status: DC
  Administered 2016-03-12 – 2016-03-15 (×6): 100 mg via ORAL
  Filled 2016-03-12 (×7): qty 1

## 2016-03-12 MED ORDER — ONDANSETRON HCL 4 MG/2ML IJ SOLN: 4.0000 mg | Freq: Four times a day (QID) | INTRAMUSCULAR | Status: DC | PRN

## 2016-03-12 MED ORDER — SODIUM CHLORIDE 0.9% FLUSH
3.0000 mL | Freq: Two times a day (BID) | INTRAVENOUS | Status: DC
  Administered 2016-03-12 – 2016-03-15 (×6): 3 mL via INTRAVENOUS

## 2016-03-12 MED ORDER — ACETAMINOPHEN 650 MG RE SUPP: 650.0000 mg | Freq: Four times a day (QID) | RECTAL | Status: DC | PRN

## 2016-03-12 MED ORDER — FUROSEMIDE 20 MG PO TABS
20.0000 mg | ORAL_TABLET | Freq: Every day | ORAL | Status: DC
  Administered 2016-03-12 – 2016-03-15 (×3): 20 mg via ORAL
  Filled 2016-03-12 (×3): qty 1

## 2016-03-12 MED ORDER — ADULT MULTIVITAMIN W/MINERALS CH
1.0000 | ORAL_TABLET | Freq: Every day | ORAL | Status: DC
  Administered 2016-03-12 – 2016-03-15 (×4): 1 via ORAL
  Filled 2016-03-12 (×4): qty 1

## 2016-03-12 MED ORDER — HEPARIN SODIUM (PORCINE) 5000 UNIT/ML IJ SOLN
5000.0000 [IU] | Freq: Three times a day (TID) | INTRAMUSCULAR | Status: DC
  Administered 2016-03-12 – 2016-03-15 (×9): 5000 [IU] via SUBCUTANEOUS
  Filled 2016-03-12 (×10): qty 1

## 2016-03-12 MED ORDER — BENZONATATE 100 MG PO CAPS
100.0000 mg | ORAL_CAPSULE | Freq: Three times a day (TID) | ORAL | Status: DC | PRN
  Administered 2016-03-12: 100 mg via ORAL
  Filled 2016-03-12: qty 1

## 2016-03-12 MED ORDER — ASPIRIN EC 81 MG PO TBEC
81.0000 mg | DELAYED_RELEASE_TABLET | Freq: Every day | ORAL | Status: DC
  Administered 2016-03-12 – 2016-03-15 (×4): 81 mg via ORAL
  Filled 2016-03-12 (×4): qty 1

## 2016-03-12 MED ORDER — INSULIN ASPART 100 UNIT/ML ~~LOC~~ SOLN
0.0000 [IU] | Freq: Three times a day (TID) | SUBCUTANEOUS | Status: DC
  Administered 2016-03-13: 2 [IU] via SUBCUTANEOUS
  Administered 2016-03-14: 3 [IU] via SUBCUTANEOUS
  Administered 2016-03-14 – 2016-03-15 (×3): 2 [IU] via SUBCUTANEOUS
  Administered 2016-03-15: 3 [IU] via SUBCUTANEOUS
  Filled 2016-03-12: qty 3
  Filled 2016-03-12: qty 2
  Filled 2016-03-12: qty 3
  Filled 2016-03-12 (×3): qty 2

## 2016-03-12 MED ORDER — POTASSIUM CHLORIDE CRYS ER 10 MEQ PO TBCR
10.0000 meq | EXTENDED_RELEASE_TABLET | Freq: Every day | ORAL | Status: DC
  Administered 2016-03-12 – 2016-03-15 (×4): 10 meq via ORAL
  Filled 2016-03-12 (×4): qty 1

## 2016-03-12 NOTE — ED Provider Notes (Signed)
[facility] Emergency Department Provider Note   ____________________________________________   First MD Initiated Contact with Patient 03/12/16 [phone]     (approximate)  I have reviewed the triage vital signs and the nursing notes.   HISTORY  Chief Complaint Influenza    HPI Vincent Jackson is a 83 y.o. male who comes in complaining of 2 days progressively worsening cough. He is running a fever. And today he said he couldn't get enough oxygen. He felt short of breath. He is not aching all over does not have any nausea vomiting or diarrhea or any other complaints. Cough is always nonproductive.  Past Medical History:  Diagnosis Date  . CAD (coronary artery disease)   . Cancer [facility])    Bladder cancer   . Diabetes mellitus without complication ([place])   . Diastolic heart failure ([place])   . Hyperlipidemia   . Prostate cancer [facility])     Patient Active Problem List   Diagnosis Date Noted  . PVD (peripheral vascular disease) ([place]) 01/24/2016  . Decreased pedal pulses 12/16/2015  . History of bladder cancer 12/16/2015  . CAD (coronary artery disease) 09/09/2015  . Hyperlipidemia 09/09/2015  . Diabetes ([place]) 09/09/2015    Past Surgical History:  Procedure Laterality Date  . BLADDER REMOVAL  2007  . MASTOIDECTOMY    . PROSTATE SURGERY  2007   removed   . TONSILLECTOMY      Prior to Admission medications   Medication Sig Start Date End Date Taking? Authorizing Provider  aspirin EC 81 MG tablet Take 81 mg by mouth daily.    Yes Historical Provider, MD  Multiple Vitamin (MULTIVITAMIN WITH MINERALS) TABS tablet Take 1 tablet by mouth daily.   Yes Historical Provider, MD  furosemide (LASIX) 20 MG tablet Take by mouth. 07/01/14   Historical Provider, MD  glipiZIDE (GLUCOTROL XL) 10 MG 24 hr tablet Take by mouth. 04/15/15   Historical Provider, MD  latanoprost (XALATAN) 0.005 % ophthalmic solution PLACE ONE DROP INTO EACH EYE EVERY NIGHT 11/28/15   Historical  Provider, MD  metoprolol succinate (TOPROL-XL) 25 MG 24 hr tablet  07/05/15   Historical Provider, MD  pantoprazole (PROTONIX) 40 MG tablet  05/24/15   Historical Provider, MD  potassium chloride (K-DUR,KLOR-CON) 10 MEQ tablet  07/11/15   Historical Provider, MD  simvastatin (ZOCOR) 40 MG tablet  06/20/15   Historical Provider, MD    Allergies Patient has no known allergies.  Family History  Problem Relation Age of Onset  . Cataracts    . Diabetes      Social History Social History  Substance Use Topics  . Smoking status: Former Smoker    Quit date: 09/08/1997  . Smokeless tobacco: Not on file  . Alcohol use 0.0 oz/week     Comment: Occasional     Review of Systems Constitutional:fever/chills Eyes: No visual changes. ENT: No sore throat. Cardiovascular: Denies chest pain. Respiratory:  shortness of breath. Gastrointestinal: No abdominal pain.  No nausea, no vomiting.  No diarrhea.  No constipation. Genitourinary: Negative for dysuria. Musculoskeletal: Negative for back pain. Skin: Negative for rash.  10-point ROS otherwise negative.  ____________________________________________   PHYSICAL EXAM:  VITAL SIGNS: ED Triage Vitals  Enc Vitals Group     BP 03/12/16 0241 (!) 151/70     Pulse Rate 03/12/16 0241 88     Resp 03/12/16 0241 18     Temp 03/12/16 0241 (!) 100.5 F (38.1 C)     Temp Source 03/12/16  0241 Oral     SpO2 03/12/16 0241 93 %     Weight 03/12/16 0238 170 lb (77.1 kg)     Height 03/12/16 0238 6' (1.829 m)     Head Circumference --      Peak Flow --      Pain Score 03/12/16 0239 1     Pain Loc --      Pain Edu? --      Excl. in [place]? --     Constitutional: Alert and oriented. Well appearing and in no acute distress. Eyes: Conjunctivae are normal. PERRL. EOMI. Head: Atraumatic. Nose: No congestion/rhinnorhea. Mouth/Throat: Mucous membranes are moist.  Oropharynx non-erythematous. Neck: No stridor.  Cardiovascular: Normal rate, regular rhythm.  Grossly normal heart sounds.  Good peripheral circulation. Respiratory: Normal respiratory effort.  No retractions. Lungs scattered wheezes Gastrointestinal: Soft and nontender. No distention. No abdominal bruits. No CVA tenderness. Musculoskeletal: No lower extremity tenderness trace edema.  No joint effusions. Neurologic:  Normal speech and language. No gross focal neurologic deficits are appreciated. No gait instability. Skin:  Skin is warm, dry and intact. No rash noted. Psychiatric: Mood and affect are normal. Speech and behavior are normal.  ____________________________________________   LABS (all labs ordered are listed, but only abnormal results are displayed)  Labs Reviewed  COMPREHENSIVE METABOLIC PANEL - Abnormal; Notable for the following:       Result Value   Glucose, Bld 58 (*)    BUN 24 (*)    Creatinine, Ser 1.61 (*)    Calcium 8.8 (*)    ALT 15 (*)    GFR calc non Af Amer 35 (*)    GFR calc Af Amer 41 (*)    All other components within normal limits  TROPONIN I - Abnormal; Notable for the following:    Troponin I 0.05 (*)    All other components within normal limits  CBC WITH DIFFERENTIAL/PLATELET - Abnormal; Notable for the following:    RBC 3.38 (*)    Hemoglobin 10.6 (*)    HCT 30.6 (*)    Platelets 121 (*)    Lymphs Abs 0.5 (*)    All other components within normal limits  INFLUENZA PANEL BY PCR (TYPE A & B, H1N1) - Abnormal; Notable for the following:    Influenza A By PCR POSITIVE (*)    All other components within normal limits  URINE CULTURE  CULTURE, BLOOD (ROUTINE X 2)  CULTURE, BLOOD (ROUTINE X 2)  URINALYSIS, COMPLETE (UACMP) WITH MICROSCOPIC   ____________________________________________  EKG  EKG read and interpreted by me shows a completely paced rhythm at a rate of 87 ____________________________________________  RADIOLOGY  Study Result   CLINICAL DATA:  Progressive shortness of breath today. Tachycardia, low oxygen  saturation.  EXAM: PORTABLE CHEST 1 VIEW  COMPARISON:  12/05/2013  FINDINGS: Dual lead left-sided pacemaker remains in place. Unchanged heart size and mediastinal contours with borderline cardiomegaly. There is atherosclerosis of the thoracic aorta. There is left basilar scarring with calcified granuloma. Right lung is clear. No pulmonary edema. No pleural fluid or pneumothorax.  IMPRESSION: 1. No acute abnormality. 2. Left basilar scarring and calcified granuloma. 3. Thoracic aortic atherosclerosis.   Electronically Signed   By: Jeb Levering M.D.   On: 03/12/2016 03:34     ____________________________________________   PROCEDURES  Procedure(s) performed:   Procedures  Critical Care performed:   ____________________________________________   INITIAL IMPRESSION / ASSESSMENT AND PLAN / ED COURSE  Pertinent labs & imaging results that  were available during my care of the patient were reviewed by me and considered in my medical decision making (see chart for details).    Clinical Course     O2 sats 88 on room air ____________________________________________   FINAL CLINICAL IMPRESSION(S) / ED DIAGNOSES  Final diagnoses:  Hypoxia  Influenza  Cough  Bronchitis      NEW MEDICATIONS STARTED DURING THIS VISIT:  New Prescriptions   No medications on file     Note:  This document was prepared using Dragon voice recognition software and may include unintentional dictation errors.    Nena Polio, MD 03/12/16 [phone]

## 2016-03-12 NOTE — Progress Notes (Signed)
[place] made a follow-up visit with the Pt. Pt appeared and told [place] that he had not seen a Dc and was waiting to see a Dc. Pt shared his health concerns with Ch and requested for prayers, which [place] offered with [facility] of presence.     03/12/16 1400  Clinical Encounter Type  Visited With Patient  Visit Type Initial;Follow-up  Referral From Chaplain  Consult/Referral To Chaplain  Spiritual Encounters  Spiritual Needs Prayer;Emotional  Stress Factors  Patient Stress Factors None identified  Family Stress Factors None identified

## 2016-03-12 NOTE — Progress Notes (Signed)
83 yo wm admitted to room 253 from ED with Flu, droplet precautions in place.  A&O x 3, unsteady on feet at this time.  SL lt fa flushes well.  No distress on 2LO2 per [place].  Lungs diminished with exp wheezes bil.  Cardiac monitor placed on pt and verified with Roselyn Reef, CNA.  Skin intact, checked with Bethel Born RN.  Oriented to room and surroundings, POC reviewed with pt.  Denies need at this time.  CB in reach, SR up x2. Wallet sent to security.

## 2016-03-12 NOTE — Plan of Care (Signed)
Problem: Safety: Goal: Ability to remain free from injury will improve Outcome: Progressing Fall precautions in place  Problem: Pain Managment: Goal: General experience of comfort will improve Outcome: Progressing Prn medications  Problem: Physical Regulation: Goal: Will remain free from infection Outcome: Not Progressing Tamiflu for flu  Problem: Tissue Perfusion: Goal: Risk factors for ineffective tissue perfusion will decrease Outcome: Progressing SQ Heparin

## 2016-03-12 NOTE — ED Triage Notes (Signed)
Patient from [facility] with [facility] today but progressively worse over 24 hours. Febrile. Complaining of flu like symptoms. Tachycardiac and lower oxygen sats on room air. Lung sounds bilaterally diminished. Denies N/V/D. Has had flu shot. Alert and oriented. States lightheaded and dizzy.

## 2016-03-12 NOTE — H&P (Signed)
Vincent Jackson is an 83 y.o. male.   Chief Complaint: Cough HPI: The patient with past medical history of diabetes and heart disease presents emergency department complaining of general malaise and cough. The patient states that he began to feel unwell yesterday. He feels somewhat achy and has had a nonproductive cough that he states has kept him up at night. He denies nausea and chest pain but is unsure if he has a fever. He also denies shortness of breath. The patient was found to be influenza positive at the emergency department. The patient's respiratory rate fluctuated during observation but remained fairly high. Due to his age and comorbidities the emergency department staff as to the hospitalist service for admission.  Past Medical History:  Diagnosis Date  . CAD (coronary artery disease)   . Cancer [facility])    Bladder cancer   . Diabetes mellitus without complication ([place])   . Diastolic heart failure ([place])   . Hyperlipidemia   . Prostate cancer [facility])     Past Surgical History:  Procedure Laterality Date  . BLADDER REMOVAL  2007  . MASTOIDECTOMY    . PROSTATE SURGERY  2007   removed   . TONSILLECTOMY      Family History  Problem Relation Age of Onset  . Cataracts    . Diabetes     Social History:  reports that he quit smoking about 18 years ago. He does not have any smokeless tobacco history on file. He reports that he drinks alcohol. He reports that he does not use drugs.  Allergies: No Known Allergies  Prior to Admission medications   Medication Sig Start Date End Date Taking? Authorizing Provider  aspirin EC 81 MG tablet Take 81 mg by mouth daily.    Yes Historical Provider, MD  Multiple Vitamin (MULTIVITAMIN WITH MINERALS) TABS tablet Take 1 tablet by mouth daily.   Yes Historical Provider, MD  furosemide (LASIX) 20 MG tablet Take by mouth. 07/01/14   Historical Provider, MD  glipiZIDE (GLUCOTROL XL) 10 MG 24 hr tablet Take by mouth. 04/15/15   Historical Provider, MD   latanoprost (XALATAN) 0.005 % ophthalmic solution PLACE ONE DROP INTO EACH EYE EVERY NIGHT 11/28/15   Historical Provider, MD  metoprolol succinate (TOPROL-XL) 25 MG 24 hr tablet  07/05/15   Historical Provider, MD  pantoprazole (PROTONIX) 40 MG tablet  05/24/15   Historical Provider, MD  potassium chloride (K-DUR,KLOR-CON) 10 MEQ tablet  07/11/15   Historical Provider, MD  simvastatin (ZOCOR) 40 MG tablet  06/20/15   Historical Provider, MD     Results for orders placed or performed during the hospital encounter of 03/12/16 (from the past 48 hour(s))  Comprehensive metabolic panel     Status: Abnormal   Collection Time: 03/12/16  3:26 AM  Result Value Ref Range   Sodium 139 135 - 145 mmol/L   Potassium 4.4 3.5 - 5.1 mmol/L   Chloride 106 101 - 111 mmol/L   CO2 28 22 - 32 mmol/L   Glucose, Bld 58 (L) 65 - 99 mg/dL   BUN 24 (H) 6 - 20 mg/dL   Creatinine, Ser 1.61 (H) 0.61 - 1.24 mg/dL   Calcium 8.8 (L) 8.9 - 10.3 mg/dL   Total Protein 6.9 6.5 - 8.1 g/dL   Albumin 4.0 3.5 - 5.0 g/dL   AST 30 15 - 41 U/L   ALT 15 (L) 17 - 63 U/L   Alkaline Phosphatase 47 38 - 126 U/L   Total Bilirubin 0.7  0.3 - 1.2 mg/dL   GFR calc non Af Amer 35 (L) >60 mL/min   GFR calc Af Amer 41 (L) >60 mL/min    Comment: (NOTE) The eGFR has been calculated using the CKD EPI equation. This calculation has not been validated in all clinical situations. eGFR's persistently <60 mL/min signify possible Chronic Kidney Disease.    Anion gap 5 5 - 15  Troponin I     Status: Abnormal   Collection Time: 03/12/16  3:26 AM  Result Value Ref Range   Troponin I 0.05 (HH) <0.03 ng/mL    Comment: CRITICAL RESULT CALLED TO, READ BACK BY AND VERIFIED WITH JENNA ELLINGTON AT 0404 03/12/16.PMH  CBC with Differential     Status: Abnormal   Collection Time: 03/12/16  3:26 AM  Result Value Ref Range   WBC 3.8 3.8 - 10.6 K/uL   RBC 3.38 (L) 4.40 - 5.90 MIL/uL   Hemoglobin 10.6 (L) 13.0 - 18.0 g/dL   HCT 74.1 (L) 28.7 - 86.7 %    MCV 90.4 80.0 - 100.0 fL   MCH 31.3 26.0 - 34.0 pg   MCHC 34.6 32.0 - 36.0 g/dL   RDW 67.2 09.4 - 70.9 %   Platelets 121 (L) 150 - 440 K/uL   Neutrophils Relative % 73 %   Neutro Abs 2.8 1.4 - 6.5 K/uL   Lymphocytes Relative 12 %   Lymphs Abs 0.5 (L) 1.0 - 3.6 K/uL   Monocytes Relative 13 %   Monocytes Absolute 0.5 0.2 - 1.0 K/uL   Eosinophils Relative 1 %   Eosinophils Absolute 0.0 0 - 0.7 K/uL   Basophils Relative 1 %   Basophils Absolute 0.0 0 - 0.1 K/uL  Influenza panel by PCR (type A & B, H1N1)     Status: Abnormal   Collection Time: 03/12/16  3:26 AM  Result Value Ref Range   Influenza A By PCR POSITIVE (A) NEGATIVE   Influenza B By PCR NEGATIVE NEGATIVE    Comment: (NOTE) The Xpert Xpress Flu assay is intended as an aid in the diagnosis of  influenza and should not be used as a sole basis for treatment.  This  assay is FDA approved for nasopharyngeal swab specimens only. Nasal  washings and aspirates are unacceptable for Xpert Xpress Flu testing.    Dg Chest Portable 1 View  Result Date: 03/12/2016 CLINICAL DATA:  Progressive shortness of breath today. Tachycardia, low oxygen saturation. EXAM: PORTABLE CHEST 1 VIEW COMPARISON:  12/05/2013 FINDINGS: Dual lead left-sided pacemaker remains in place. Unchanged heart size and mediastinal contours with borderline cardiomegaly. There is atherosclerosis of the thoracic aorta. There is left basilar scarring with calcified granuloma. Right lung is clear. No pulmonary edema. No pleural fluid or pneumothorax. IMPRESSION: 1. No acute abnormality. 2. Left basilar scarring and calcified granuloma. 3. Thoracic aortic atherosclerosis. Electronically Signed   By: Rubye Oaks M.D.   On: 03/12/2016 03:34    Review of Systems  Constitutional: Positive for malaise/fatigue. Negative for chills and fever.  HENT: Negative for sore throat and tinnitus.   Eyes: Negative for blurred vision and redness.  Respiratory: Positive for cough. Negative  for shortness of breath.   Cardiovascular: Negative for chest pain, palpitations, orthopnea and PND.  Gastrointestinal: Negative for abdominal pain, diarrhea, nausea and vomiting.  Genitourinary: Negative for dysuria, frequency and urgency.  Musculoskeletal: Negative for joint pain and myalgias.  Skin: Negative for rash.       No lesions  Neurological: Negative for  speech change, focal weakness and weakness.  Endo/Heme/Allergies: Does not bruise/bleed easily.       No temperature intolerance  Psychiatric/Behavioral: Negative for depression and suicidal ideas.    Blood pressure 116/64, pulse 88, temperature (!) 100.5 F (38.1 C), temperature source Oral, resp. rate 17, height 6' (1.829 m), weight 77.1 kg (170 lb), SpO2 95 %. Physical Exam  Nursing note and vitals reviewed. Constitutional: He is oriented to person, place, and time. He appears well-developed and well-nourished. No distress.  HENT:  Head: Normocephalic and atraumatic.  Mouth/Throat: Oropharynx is clear and moist.  Eyes: Conjunctivae and EOM are normal. Pupils are equal, round, and reactive to light. No scleral icterus.  Neck: Normal range of motion. Neck supple. No JVD present. No tracheal deviation present. No thyromegaly present.  Cardiovascular: Normal rate and regular rhythm.  Exam reveals no gallop and no friction rub.   No murmur heard. Respiratory: Effort normal and breath sounds normal. No respiratory distress.  GI: Soft. Bowel sounds are normal. He exhibits no distension. There is no tenderness.  Genitourinary:  Genitourinary Comments: Deferred  Musculoskeletal: Normal range of motion. He exhibits no edema.  Lymphadenopathy:    He has no cervical adenopathy.  Neurological: He is alert and oriented to person, place, and time. No cranial nerve deficit.  Skin: Skin is warm and dry. No rash noted. No erythema.  Psychiatric: He has a normal mood and affect. His behavior is normal. Judgment and thought content  normal.     Assessment/Plan This is a 83 year old male admitted for viral sepsis secondary to influenza. 1. Influenza: Type A; started Tamiflu adjusted for renal function. The patient is hemodynamically stable. He needs septic criteria intermittently. Hydrate with intravenous fluid. Symptomatic care.  2. Chronic kidney disease: Stage III; avoid nephrotoxic agents. 3. Elevated troponin: Likely due to demand ischemia secondary to viral syndrome and decreased renal clearance. Recheck. Monitor telemetry. EKG is ventricular pacing. Cardiology consultation at the discretion of primary team. 4. Coronary artery disease: No chest pain; continue aspirin and metoprolol 5. Diabetes mellitus type 2: Hold oral hypoglycemic agents. Sliding scale insulin while hospitalized. 6. Hyperlipidemia: Continue statin therapy 7. DVT prophylaxis: Heparin 8. GI prophylaxis: Pantoprazole per home regimen The patient is a full code. Time spent on admission orders and patient care approximately 45 minutes  Harrie Foreman, MD 03/12/2016, 6:45 AM

## 2016-03-12 NOTE — ED Notes (Signed)
Patient O2 Saturation currently at 95% on 2L. RN will continue to monitor

## 2016-03-12 NOTE — Progress Notes (Signed)
Patient is admitted to the hospital with cough Seen and evaluated in the emergency department. Feeling fine. Patient is aware that his flu positive. Patient is started on Tamiflu will continue current management and plan  This is a 83 year old male admitted for viral sepsis secondary to influenza. 1. Influenza: Type A;  Continue Tamiflu adjusted for renal function.  The patient is hemodynamically stable. Hydrate with intravenous fluid. Symptomatic care.   2. Chronic kidney disease: Stage III; avoid nephrotoxic agents.  3. Elevated troponin: Likely due to demand ischemia secondary to viral syndrome and decreased renal clearance.  Patient is clinically asymptomatic  Monitor telemetry. EKG is ventricular pacing.  4. Coronary artery disease: No chest pain; continue aspirin and metoprolol  5. Diabetes mellitus type 2: Hold oral hypoglycemic agents. Sliding scale insulin while hospitalized.  6. Hyperlipidemia: Continue statin therapy  7. DVT prophylaxis: Heparin 8. GI prophylaxis: Pantoprazole per home regimen  Plan of care discussed with the patient. More than 50% time spent on cognition of the care and counseling Time spent 30 minutes

## 2016-03-12 NOTE — Progress Notes (Addendum)
Pt CBG at 2200 reported at 50. Pt asymptomatic. 2 containers of orange juice given. BS rechecked at 2254, BS at 79. Will continue to monitor

## 2016-03-12 NOTE — ED Notes (Signed)
Patient's O2 saturation decreased to 89% on RA. Patient placed on 2L [place]. RN will continue to monitor

## 2016-03-13 DIAGNOSIS — J101 Influenza due to other identified influenza virus with other respiratory manifestations: Secondary | ICD-10-CM | POA: Diagnosis not present

## 2016-03-13 DIAGNOSIS — N183 Chronic kidney disease, stage 3 (moderate): Secondary | ICD-10-CM | POA: Diagnosis not present

## 2016-03-13 DIAGNOSIS — A4189 Other specified sepsis: Secondary | ICD-10-CM | POA: Diagnosis not present

## 2016-03-13 DIAGNOSIS — E119 Type 2 diabetes mellitus without complications: Secondary | ICD-10-CM | POA: Diagnosis not present

## 2016-03-13 DIAGNOSIS — R748 Abnormal levels of other serum enzymes: Secondary | ICD-10-CM | POA: Diagnosis not present

## 2016-03-13 LAB — CREATININE, SERUM
Creatinine, Ser: 1.6 mg/dL — ABNORMAL HIGH (ref 0.61–1.24)
GFR calc non Af Amer: 35 mL/min — ABNORMAL LOW (ref >60)
GFR, EST AFRICAN AMERICAN: 41 mL/min — AB (ref >60)

## 2016-03-13 LAB — BLOOD CULTURE ID PANEL (REFLEXED)
Acinetobacter baumannii: NOT DETECTED
CANDIDA PARAPSILOSIS: NOT DETECTED
CANDIDA TROPICALIS: NOT DETECTED
Candida albicans: NOT DETECTED
Candida glabrata: NOT DETECTED
Candida krusei: NOT DETECTED
Enterobacter cloacae complex: NOT DETECTED
Enterobacteriaceae species: NOT DETECTED
Enterococcus species: NOT DETECTED
Escherichia coli: NOT DETECTED
Haemophilus influenzae: NOT DETECTED
KLEBSIELLA OXYTOCA: NOT DETECTED
KLEBSIELLA PNEUMONIAE: NOT DETECTED
Listeria monocytogenes: NOT DETECTED
METHICILLIN RESISTANCE: DETECTED — AB
NEISSERIA MENINGITIDIS: NOT DETECTED
PROTEUS SPECIES: NOT DETECTED
Pseudomonas aeruginosa: NOT DETECTED
SERRATIA MARCESCENS: NOT DETECTED
STAPHYLOCOCCUS AUREUS BCID: NOT DETECTED
STAPHYLOCOCCUS SPECIES: DETECTED — AB
STREPTOCOCCUS PYOGENES: NOT DETECTED
Streptococcus agalactiae: NOT DETECTED
Streptococcus pneumoniae: NOT DETECTED
Streptococcus species: NOT DETECTED

## 2016-03-13 LAB — URINE CULTURE

## 2016-03-13 LAB — CBC WITH DIFFERENTIAL/PLATELET
BASOS ABS: 0 10*3/uL (ref 0–0.1)
BASOS PCT: 1 %
Eosinophils Absolute: 0 10*3/uL (ref 0–0.7)
Eosinophils Relative: 0 %
HEMATOCRIT: 28.6 % — AB (ref 40.0–52.0)
HEMOGLOBIN: 9.8 g/dL — AB (ref 13.0–18.0)
Lymphocytes Relative: 15 %
Lymphs Abs: 0.6 10*3/uL — ABNORMAL LOW (ref 1.0–3.6)
MCH: 31.3 pg (ref 26.0–34.0)
MCHC: 34.2 g/dL (ref 32.0–36.0)
MCV: 91.5 fL (ref 80.0–100.0)
Monocytes Absolute: 0.5 10*3/uL (ref 0.2–1.0)
Monocytes Relative: 12 %
NEUTROS ABS: 2.9 10*3/uL (ref 1.4–6.5)
Neutrophils Relative %: 72 %
Platelets: 104 10*3/uL — ABNORMAL LOW (ref 150–440)
RBC: 3.13 MIL/uL — ABNORMAL LOW (ref 4.40–5.90)
RDW: 14 % (ref 11.5–14.5)
WBC: 4 10*3/uL (ref 3.8–10.6)

## 2016-03-13 LAB — GLUCOSE, CAPILLARY
Glucose-Capillary: 102 mg/dL — ABNORMAL HIGH (ref 65–99)
Glucose-Capillary: 114 mg/dL — ABNORMAL HIGH (ref 65–99)
Glucose-Capillary: 160 mg/dL — ABNORMAL HIGH (ref 65–99)
Glucose-Capillary: 103 mg/dL — ABNORMAL HIGH (ref 65–99)

## 2016-03-13 MED ORDER — FUROSEMIDE 10 MG/ML IJ SOLN
20.0000 mg | Freq: Once | INTRAMUSCULAR | Status: AC
  Administered 2016-03-13: 20 mg via INTRAVENOUS
  Filled 2016-03-13: qty 2

## 2016-03-13 NOTE — Evaluation (Signed)
Physical Therapy Evaluation Patient Details Name: GEOVANIE WINNETT MRN: [id number] DOB: 1920/01/24 Today's Date: 03/13/2016   History of Present Illness  83 y/o male here with a few days of cough, weakness and difficulty with breathing.   Clinical Impression  Pt did well with ambulation and showed good safety and confidence using the walker. He did not have excessive fatigue and his O2 did stay in the mid 90s (on 2 liters O2 the entire time). He showed good confidence with all mobility and despite not having his wife a home right now feels he will do alright alone.  Pt does not require further PT intervention at this time.     Follow Up Recommendations No PT follow up    Equipment Recommendations   (O2?)    Recommendations for Other Services       Precautions / Restrictions Restrictions Weight Bearing Restrictions: No      Mobility  Bed Mobility Overal bed mobility: Independent                Transfers Overall transfer level: Independent Equipment used: Rolling walker (2 wheeled)             General transfer comment: Pt able to rise w/o hesitation and generally shows good confidence and strength  Ambulation/Gait Ambulation/Gait assistance: Modified independent (Device/Increase time) Ambulation Distance (Feet): 200 Feet Assistive device: Rolling walker (2 wheeled)       General Gait Details: Pt with light reliance on the walker, but did not quite feel confident going w/o AD.  He had only minimal fatigue and on 2 liters his O2 remained in the mid 90s the whole time.   Stairs            Wheelchair Mobility    Modified Rankin (Stroke Patients Only)       Balance Overall balance assessment: Independent                                           Pertinent Vitals/Pain Pain Assessment: No/denies pain    Home Living Family/patient expects to be discharged to:: Private residence Living Arrangements: Spouse/significant other (wife  is in rehab with fractured pelvis)   Type of Home: Independent living facility (apartment at [facility] at [place])         Home Equipment: Kasandra Knudsen - single point;Walker - 4 wheels      Prior Function Level of Independence: Independent with assistive device(s)         Comments: reports he often uses a cane and does occasionally use rollator     Hand Dominance        Extremity/Trunk Assessment   Upper Extremity Assessment Upper Extremity Assessment: Overall WFL for tasks assessed (age appropriate deficits)    Lower Extremity Assessment Lower Extremity Assessment: Overall WFL for tasks assessed       Communication   Communication: No difficulties  Cognition Arousal/Alertness: Awake/alert Behavior During Therapy: WFL for tasks assessed/performed Overall Cognitive Status: Within Functional Limits for tasks assessed                      General Comments      Exercises     Assessment/Plan    PT Assessment Patent does not need any further PT services  PT Problem List            PT Treatment Interventions  PT Goals (Current goals can be found in the Care Plan section)  Acute Rehab PT Goals Patient Stated Goal: go home    Frequency     Barriers to discharge        Co-evaluation               End of Session Equipment Utilized During Treatment: Gait belt;Oxygen (2 liters) Activity Tolerance: Patient tolerated treatment well Patient left: with chair alarm set;with call bell/phone within reach      Functional Assessment Tool Used: clinical judgement Functional Limitation: Mobility: Walking and moving around Mobility: Walking and Moving Around Current Status (W4315): At least 1 percent but less than 20 percent impaired, limited or restricted Mobility: Walking and Moving Around Goal Status [phone]): At least 1 percent but less than 20 percent impaired, limited or restricted Mobility: Walking and Moving Around Discharge Status [phone]): At  least 1 percent but less than 20 percent impaired, limited or restricted    Time: 1412-1430 PT Time Calculation (min) (ACUTE ONLY): 18 min   Charges:   PT Evaluation $PT Eval Low Complexity: 1 Procedure     PT G Codes:   PT G-Codes **NOT FOR INPATIENT CLASS** Functional Assessment Tool Used: clinical judgement Functional Limitation: Mobility: Walking and moving around Mobility: Walking and Moving Around Current Status (K9326): At least 1 percent but less than 20 percent impaired, limited or restricted Mobility: Walking and Moving Around Goal Status [phone]): At least 1 percent but less than 20 percent impaired, limited or restricted Mobility: Walking and Moving Around Discharge Status [phone]): At least 1 percent but less than 20 percent impaired, limited or restricted    Kreg Shropshire, DPT 03/13/2016, 5:14 PM

## 2016-03-13 NOTE — Care Management Obs Status (Signed)
[facility] NOTIFICATION   Patient Details  Name: Vincent Jackson MRN: [id number] Date of Birth: 03-Mar-1919   Medicare Observation Status Notification Given:  Yes    Katrina Stack, RN 03/13/2016, 4:01 PM

## 2016-03-13 NOTE — Progress Notes (Signed)
Inpatient Diabetes Program Recommendations  AACE/ADA: New Consensus Statement on Inpatient Glycemic Control (2015)  Target Ranges:  Prepandial:   less than 140 mg/dL      Peak postprandial:   less than 180 mg/dL (1-2 hours)      Critically ill patients:  140 - 180 mg/dL   Results for [name], [name] (MRN [id number]) as of 03/13/2016 12:21  Ref. Range 03/12/2016 12:19 03/12/2016 13:34 03/12/2016 17:15 03/12/2016 18:08 03/12/2016 22:00 03/12/2016 22:54 03/13/2016 07:56 03/13/2016 11:33  Glucose-Capillary Latest Ref Range: 65 - 99 mg/dL 41 (LL) 112 (H) 49 (L) 79 50 (L) 79 102 (H) 114 (H)   Review of Glycemic Control  Diabetes history: DM2 Outpatient Diabetes medications: Glipizide XL 10 mg BID Current orders for Inpatient glycemic control: Novolog 0-9 units TID with meals, Novolog 0-5 units QHS  Inpatient Diabetes Program Recommendations:  Hypoglycemia: Noted hypoglycemia x 3 on 03/12/16 which were likely due to outpatient oral DM medications. Patient has not received any insulin since being admitted. Agree with current orders. Will continue to follow.  Thanks, Barnie Alderman, RN, MSN, CDE Diabetes Coordinator Inpatient Diabetes Program [phone] (Team Pager from 8am to 5pm)

## 2016-03-13 NOTE — Progress Notes (Signed)
PT Cancellation Note  Patient Details Name: Vincent Jackson MRN: [id number] DOB: 09-Apr-1919   Cancelled Treatment:    Reason Eval/Treat Not Completed: Patient not medically ready Spoke with nursing who states that pt still likely not ready for PT and to check back later.  Temp still high, fluid on lungs - will try back later as time allows.   Kreg Shropshire, DPT 03/13/2016, 10:42 AM

## 2016-03-13 NOTE — Plan of Care (Signed)
Problem: Safety: Goal: Ability to remain free from injury will improve Outcome: Progressing Fall precautions in place  Problem: Physical Regulation: Goal: Will remain free from infection Outcome: Not Progressing Remains febrile today, on antibiotics  Problem: Tissue Perfusion: Goal: Risk factors for ineffective tissue perfusion will decrease Outcome: Progressing SQ Heparin

## 2016-03-13 NOTE — Progress Notes (Signed)
PHARMACY - PHYSICIAN COMMUNICATION CRITICAL VALUE ALERT - BLOOD CULTURE IDENTIFICATION (BCID)  Results for orders placed or performed during the hospital encounter of 03/12/16  Blood Culture ID Panel (Reflexed) (Collected: 03/12/2016  4:58 AM)  Result Value Ref Range   Enterococcus species NOT DETECTED NOT DETECTED   Listeria monocytogenes NOT DETECTED NOT DETECTED   Staphylococcus species DETECTED (A) NOT DETECTED   Staphylococcus aureus NOT DETECTED NOT DETECTED   Methicillin resistance DETECTED (A) NOT DETECTED   Streptococcus species NOT DETECTED NOT DETECTED   Streptococcus agalactiae NOT DETECTED NOT DETECTED   Streptococcus pneumoniae NOT DETECTED NOT DETECTED   Streptococcus pyogenes NOT DETECTED NOT DETECTED   Acinetobacter baumannii NOT DETECTED NOT DETECTED   Enterobacteriaceae species NOT DETECTED NOT DETECTED   Enterobacter cloacae complex NOT DETECTED NOT DETECTED   Escherichia coli NOT DETECTED NOT DETECTED   Klebsiella oxytoca NOT DETECTED NOT DETECTED   Klebsiella pneumoniae NOT DETECTED NOT DETECTED   Proteus species NOT DETECTED NOT DETECTED   Serratia marcescens NOT DETECTED NOT DETECTED   Haemophilus influenzae NOT DETECTED NOT DETECTED   Neisseria meningitidis NOT DETECTED NOT DETECTED   Pseudomonas aeruginosa NOT DETECTED NOT DETECTED   Candida albicans NOT DETECTED NOT DETECTED   Candida glabrata NOT DETECTED NOT DETECTED   Candida krusei NOT DETECTED NOT DETECTED   Candida parapsilosis NOT DETECTED NOT DETECTED   Candida tropicalis NOT DETECTED NOT DETECTED    Name of physician (or Provider) Contacted: Dr. Marcille Blanco  Changes to prescribed antibiotics required: None at this point - wait for further culture results.  Laural Benes, Pharm.D., BCPS Clinical Pharmacist 03/13/2016  6:44 AM

## 2016-03-13 NOTE — Progress Notes (Signed)
Pt congested, wet sounding, with some dyspnea.  Page out to Dr. Margaretmary Eddy.  New orders received.  Lasix 20mg  IV given, IVF stopped, will monitor pt

## 2016-03-13 NOTE — Care Management (Signed)
Placed in observation for cough.  Diagnosed with viral sepsis due to influenza. Patient is a resident of the [facility] independent living.  Independent in all adls, denies issues accessing medical care, obtaining medications or with transportation.  Physical therapy has assessed and there are no physical therapy needs. Current with PCP.  No discharge needs identified at present by care manager or members of care team

## 2016-03-13 NOTE — Progress Notes (Signed)
Vincent Jackson    MR#:  [id number]  DATE OF BIRTH:  03/05/1919  SUBJECTIVE:  CHIEF COMPLAINT:  Pt is feeling weak but cough is better  REVIEW OF SYSTEMS:  CONSTITUTIONAL: No fever, fatigue ; reports weakness.  EYES: No blurred or double vision.  EARS, NOSE, AND THROAT: No tinnitus or ear pain.  RESPIRATORY: has  cough, shortness of breath, no wheezing or hemoptysis.  CARDIOVASCULAR: No chest pain, orthopnea, edema.  GASTROINTESTINAL: No nausea, vomiting, diarrhea or abdominal pain.  GENITOURINARY: No dysuria, hematuria.  ENDOCRINE: No polyuria, nocturia,  HEMATOLOGY: No anemia, easy bruising or bleeding SKIN: No rash or lesion. MUSCULOSKELETAL: No joint pain or arthritis.   NEUROLOGIC: No tingling, numbness, weakness.  PSYCHIATRY: No anxiety or depression.   DRUG ALLERGIES:  No Known Allergies  VITALS:  Blood pressure (!) 116/53, pulse 80, temperature 98.9 F (37.2 C), temperature source Oral, resp. rate (!) 30, height 6' (1.829 m), weight 77.2 kg (170 lb 1.6 oz), SpO2 94 %.  PHYSICAL EXAMINATION:  GENERAL:  83 y.o.-year-old patient lying in the bed with no acute distress.  EYES: Pupils equal, round, reactive to light and accommodation. No scleral icterus. Extraocular muscles intact.  HEENT: Head atraumatic, normocephalic. Oropharynx and nasopharynx clear.  NECK:  Supple, no jugular venous distention. No thyroid enlargement, no tenderness.  LUNGS: Mod breath sounds bilaterally, no wheezing, rales,rhonchi or crepitation. No use of accessory muscles of respiration.  CARDIOVASCULAR: S1, S2 normal. No murmurs, rubs, or gallops.  ABDOMEN: Soft, nontender, nondistended. Bowel sounds present. No organomegaly or mass.  EXTREMITIES: No pedal edema, cyanosis, or clubbing.  NEUROLOGIC: Cranial nerves II through XII are intact. Muscle strength 5/5 in all extremities. Sensation intact. Gait not checked.   PSYCHIATRIC: The patient is alert and oriented x 3.  SKIN: No obvious rash, lesion, or ulcer.    LABORATORY PANEL:   CBC  Recent Labs Lab 03/13/16 0452  WBC 4.0  HGB 9.8*  HCT 28.6*  PLT 104*   ------------------------------------------------------------------------------------------------------------------  Chemistries   Recent Labs Lab 03/12/16 0326 03/13/16 0452  NA 139  --   K 4.4  --   CL 106  --   CO2 28  --   GLUCOSE 58*  --   BUN 24*  --   CREATININE 1.61* 1.60*  CALCIUM 8.8*  --   AST 30  --   ALT 15*  --   ALKPHOS 47  --   BILITOT 0.7  --    ------------------------------------------------------------------------------------------------------------------  Cardiac Enzymes  Recent Labs Lab 03/12/16 1048  TROPONINI 0.06*   ------------------------------------------------------------------------------------------------------------------  RADIOLOGY:  Dg Chest Portable 1 View  Result Date: 03/12/2016 CLINICAL DATA:  Progressive shortness of breath today. Tachycardia, low oxygen saturation. EXAM: PORTABLE CHEST 1 VIEW COMPARISON:  12/05/2013 FINDINGS: Dual lead left-sided pacemaker remains in place. Unchanged heart size and mediastinal contours with borderline cardiomegaly. There is atherosclerosis of the thoracic aorta. There is left basilar scarring with calcified granuloma. Right lung is clear. No pulmonary edema. No pleural fluid or pneumothorax. IMPRESSION: 1. No acute abnormality. 2. Left basilar scarring and calcified granuloma. 3. Thoracic aortic atherosclerosis. Electronically Signed   By: Jeb Levering M.D.   On: 03/12/2016 03:34    EKG:   Orders placed or performed during the hospital encounter of 03/12/16  . ED EKG  . ED EKG  . EKG 12-Lead  . EKG 12-Lead  . EKG 12-Lead  . EKG 12-Lead  ASSESSMENT AND PLAN:   This is a 83 year old male admitted for viral sepsis secondary to influenza.  1. Influenza: Type A;  Clinically better,  but weak Continue Tamiflu adjusted for renal function.  The patient is hemodynamically stable. Hydrate with intravenous fluid. Symptomatic care.   2. Chronic kidney disease: Stage III; avoid nephrotoxic agents.  3. Elevated troponin: Likely due to demand ischemia secondary to viral syndrome and decreased renal clearance.  Patient is clinically asymptomatic  Monitor telemetry. EKG is ventricular pacing.  4. Coronary artery disease: No chest pain; continue aspirin and metoprolol  5. Diabetes mellitus type 2: Hold oral hypoglycemic agents. Sliding scale insulin while hospitalized.  6. Hyperlipidemia: Continue statin therapy  7. DVT prophylaxis: Heparin 8. GI prophylaxis: Pantoprazole per home regimen  Plan of care discussed with the patient. More than 50% time spent on cognition of the care and counseling   PT eval   All the records are reviewed and case discussed with Care Management/Social Workerr. Management plans discussed with the patient, family and they are in agreement.  CODE STATUS: fc  TOTAL TIME TAKING CARE OF THIS PATIENT: 46minutes.   POSSIBLE D/C IN 1-2 DAYS, DEPENDING ON CLINICAL CONDITION.  Note: This dictation was prepared with Dragon dictation along with smaller phrase technology. Any transcriptional errors that result from this process are unintentional.   [name] M.D on 03/13/2016 at 7:48 PM  Between 7am to 6pm - Pager - [phone] After 6pm go to [email or web address] - password [email or web address] [facility]  [facility] [facility]  Office  [phone]  CC: Primary care physician; Tommi Rumps, MD

## 2016-03-14 DIAGNOSIS — E119 Type 2 diabetes mellitus without complications: Secondary | ICD-10-CM | POA: Diagnosis not present

## 2016-03-14 DIAGNOSIS — A4189 Other specified sepsis: Secondary | ICD-10-CM | POA: Diagnosis not present

## 2016-03-14 DIAGNOSIS — R748 Abnormal levels of other serum enzymes: Secondary | ICD-10-CM | POA: Diagnosis not present

## 2016-03-14 DIAGNOSIS — N183 Chronic kidney disease, stage 3 (moderate): Secondary | ICD-10-CM | POA: Diagnosis not present

## 2016-03-14 DIAGNOSIS — J101 Influenza due to other identified influenza virus with other respiratory manifestations: Secondary | ICD-10-CM | POA: Diagnosis not present

## 2016-03-14 LAB — CULTURE, BLOOD (ROUTINE X 2)

## 2016-03-14 LAB — GLUCOSE, CAPILLARY
Glucose-Capillary: 158 mg/dL — ABNORMAL HIGH (ref 65–99)
Glucose-Capillary: 215 mg/dL — ABNORMAL HIGH (ref 65–99)
Glucose-Capillary: 184 mg/dL — ABNORMAL HIGH (ref 65–99)
Glucose-Capillary: 161 mg/dL — ABNORMAL HIGH (ref 65–99)

## 2016-03-14 NOTE — Progress Notes (Signed)
PT Cancellation Note  Patient Details Name: GREGOR DERSHEM MRN: [id number] DOB: 1919/11/15   Cancelled Treatment:    Reason Eval/Treat Not Completed: Other (comment) (Duplicate PT order received.  Per RN, patient without change in status from previous date.  No need for PT re-evaluation at this time.  Duplicate order completed; please re-consult should needs change.)   Marolyn Urschel H. Owens Shark, PT, DPT, NCS 03/14/16, 9:49 AM [phone]

## 2016-03-14 NOTE — Progress Notes (Signed)
[facility] [place] NAME: Vincent Jackson    MR#:  [id number]  DATE OF BIRTH:  02-Dec-1919  SUBJECTIVE:  CHIEF COMPLAINT:  Pt is feeling weak but cough is better, febrile yesterday  REVIEW OF SYSTEMS:  CONSTITUTIONAL: No fever, fatigue ; reports weakness.  EYES: No blurred or double vision.  EARS, NOSE, AND THROAT: No tinnitus or ear pain.  RESPIRATORY: has  Cough, no shortness of breath, no wheezing or hemoptysis.  CARDIOVASCULAR: No chest pain, orthopnea, edema.  GASTROINTESTINAL: No nausea, vomiting, diarrhea or abdominal pain.  GENITOURINARY: No dysuria, hematuria.  ENDOCRINE: No polyuria, nocturia,  HEMATOLOGY: No anemia, easy bruising or bleeding SKIN: No rash or lesion. MUSCULOSKELETAL: No joint pain or arthritis.   NEUROLOGIC: No tingling, numbness, weakness.  PSYCHIATRY: No anxiety or depression.   DRUG ALLERGIES:  No Known Allergies  VITALS:  Blood pressure (!) 142/64, pulse 89, temperature 99.2 F (37.3 C), temperature source Oral, resp. rate 16, height 6' (1.829 m), weight 76.8 kg (169 lb 4.8 oz), SpO2 92 %.  PHYSICAL EXAMINATION:  GENERAL:  83 y.o.-year-old patient lying in the bed with no acute distress.  EYES: Pupils equal, round, reactive to light and accommodation. No scleral icterus. Extraocular muscles intact.  HEENT: Head atraumatic, normocephalic. Oropharynx and nasopharynx clear.  NECK:  Supple, no jugular venous distention. No thyroid enlargement, no tenderness.  LUNGS: Mod breath sounds bilaterally, no wheezing, rales,rhonchi or crepitation. No use of accessory muscles of respiration.  CARDIOVASCULAR: S1, S2 normal. No murmurs, rubs, or gallops.  ABDOMEN: Soft, nontender, nondistended. Bowel sounds present. No organomegaly or mass.  EXTREMITIES: No pedal edema, cyanosis, or clubbing.  NEUROLOGIC: Cranial nerves II through XII are intact. Muscle strength 5/5 in all extremities. Sensation intact. Gait not  checked.  PSYCHIATRIC: The patient is alert and oriented x 3.  SKIN: No obvious rash, lesion, or ulcer.    LABORATORY PANEL:   CBC  Recent Labs Lab 03/13/16 0452  WBC 4.0  HGB 9.8*  HCT 28.6*  PLT 104*   ------------------------------------------------------------------------------------------------------------------  Chemistries   Recent Labs Lab 03/12/16 0326 03/13/16 0452  NA 139  --   K 4.4  --   CL 106  --   CO2 28  --   GLUCOSE 58*  --   BUN 24*  --   CREATININE 1.61* 1.60*  CALCIUM 8.8*  --   AST 30  --   ALT 15*  --   ALKPHOS 47  --   BILITOT 0.7  --    ------------------------------------------------------------------------------------------------------------------  Cardiac Enzymes  Recent Labs Lab 03/12/16 1048  TROPONINI 0.06*   ------------------------------------------------------------------------------------------------------------------  RADIOLOGY:  No results found.  EKG:   Orders placed or performed during the hospital encounter of 03/12/16  . ED EKG  . ED EKG  . EKG 12-Lead  . EKG 12-Lead  . EKG 12-Lead  . EKG 12-Lead    ASSESSMENT AND PLAN:   This is a 83 year old male admitted for viral sepsis secondary to influenza.  1. Influenza: Type A;  Clinically better, but weak Continue Tamiflu adjusted for renal function.  The patient is hemodynamically stable. Hydrate with intravenous fluid. Symptomatic care. Urine cx contaminated Blood cxs 1 bottle- gm positive cocci, defer abx   2. Chronic kidney disease: Stage III; avoid nephrotoxic agents.  3. Elevated troponin: Likely due to demand ischemia secondary to viral syndrome and decreased renal clearance.  Patient is clinically asymptomatic  Monitor telemetry. EKG is ventricular pacing.  4. Coronary artery disease: No chest pain; continue aspirin and metoprolol  5. Diabetes mellitus type 2: Hold oral hypoglycemic agents. Sliding scale insulin while hospitalized.  6.  Hyperlipidemia: Continue statin therapy  7. DVT prophylaxis: Heparin 8. GI prophylaxis: Pantoprazole per home regimen  Plan of care discussed with the patient. More than 50% time spent on cognition of the care and counseling   PT eval- no PT f/u needed   All the records are reviewed and case discussed with Care Management/Social Workerr. Management plans discussed with the patient, family and they are in agreement.  CODE STATUS: fc  TOTAL TIME TAKING CARE OF THIS PATIENT: 65minutes.   POSSIBLE D/C IN 1-2 DAYS, DEPENDING ON CLINICAL CONDITION.  Note: This dictation was prepared with Dragon dictation along with smaller phrase technology. Any transcriptional errors that result from this process are unintentional.   [name] M.D on 03/14/2016 at 7:19 PM  Between 7am to 6pm - Pager - [phone] After 6pm go to [email or web address] - password [email or web address]  [place] [facility]  Office  [phone]  CC: Primary care physician; Tommi Rumps, MD

## 2016-03-15 ENCOUNTER — Encounter
Admission: RE | Admit: 2016-03-15 | Discharge: 2016-03-15 | Disposition: A | Discharge status: Home / Self Care | Payer: Medicare Other | Source: Ambulatory Visit | Attending: Internal Medicine | Admitting: Internal Medicine

## 2016-03-15 DIAGNOSIS — J101 Influenza due to other identified influenza virus with other respiratory manifestations: Secondary | ICD-10-CM | POA: Diagnosis not present

## 2016-03-15 DIAGNOSIS — N183 Chronic kidney disease, stage 3 (moderate): Secondary | ICD-10-CM | POA: Diagnosis not present

## 2016-03-15 DIAGNOSIS — E119 Type 2 diabetes mellitus without complications: Secondary | ICD-10-CM | POA: Diagnosis not present

## 2016-03-15 DIAGNOSIS — R748 Abnormal levels of other serum enzymes: Secondary | ICD-10-CM | POA: Diagnosis not present

## 2016-03-15 DIAGNOSIS — A4189 Other specified sepsis: Secondary | ICD-10-CM | POA: Diagnosis not present

## 2016-03-15 LAB — BASIC METABOLIC PANEL
Anion gap: 9 (ref 5–15)
BUN: 34 mg/dL — AB (ref 6–20)
CALCIUM: 8.5 mg/dL — AB (ref 8.9–10.3)
CO2: 27 mmol/L (ref 22–32)
CREATININE: 1.69 mg/dL — AB (ref 0.61–1.24)
Chloride: 101 mmol/L (ref 101–111)
GFR calc non Af Amer: 33 mL/min — ABNORMAL LOW (ref >60)
GFR, EST AFRICAN AMERICAN: 38 mL/min — AB (ref >60)
GLUCOSE: 176 mg/dL — AB (ref 65–99)
Potassium: 4.4 mmol/L (ref 3.5–5.1)
Sodium: 137 mmol/L (ref 135–145)

## 2016-03-15 LAB — CBC
HEMATOCRIT: 30.8 % — AB (ref 40.0–52.0)
Hemoglobin: 10.7 g/dL — ABNORMAL LOW (ref 13.0–18.0)
MCH: 31.2 pg (ref 26.0–34.0)
MCHC: 34.6 g/dL (ref 32.0–36.0)
MCV: 90.2 fL (ref 80.0–100.0)
Platelets: 125 10*3/uL — ABNORMAL LOW (ref 150–440)
RBC: 3.41 MIL/uL — ABNORMAL LOW (ref 4.40–5.90)
RDW: 13.7 % (ref 11.5–14.5)
WBC: 4.9 10*3/uL (ref 3.8–10.6)

## 2016-03-15 LAB — GLUCOSE, CAPILLARY
Glucose-Capillary: 177 mg/dL — ABNORMAL HIGH (ref 65–99)
Glucose-Capillary: 226 mg/dL — ABNORMAL HIGH (ref 65–99)

## 2016-03-15 MED ORDER — ACETAMINOPHEN 325 MG PO TABS: 650.0000 mg | ORAL_TABLET | Freq: Four times a day (QID) | ORAL | Status: DC | PRN

## 2016-03-15 MED ORDER — OSELTAMIVIR PHOSPHATE 30 MG PO CAPS: 30.0000 mg | ORAL_CAPSULE | Freq: Every day | ORAL | 0 refills | Status: DC

## 2016-03-15 MED ORDER — BENZONATATE 100 MG PO CAPS: 100.0000 mg | ORAL_CAPSULE | Freq: Three times a day (TID) | ORAL | 0 refills | Status: DC | PRN

## 2016-03-15 MED ORDER — FUROSEMIDE 20 MG PO TABS: 20.0000 mg | ORAL_TABLET | Freq: Every day | ORAL | 0 refills | Status: DC

## 2016-03-15 MED ORDER — DOCUSATE SODIUM 100 MG PO CAPS: 100.0000 mg | ORAL_CAPSULE | Freq: Two times a day (BID) | ORAL | 0 refills | Status: DC

## 2016-03-15 MED ORDER — OSELTAMIVIR PHOSPHATE 30 MG PO CAPS: 30.0000 mg | ORAL_CAPSULE | Freq: Every day | ORAL | Status: DC

## 2016-03-15 NOTE — Clinical Social Work Note (Signed)
Patient to be d/c'ed today to [facility] on [facility].  Patient and family agreeable to plans will transport via facility transportation.  Evette Cristal, MSW, [place]

## 2016-03-15 NOTE — Discharge Summary (Addendum)
[place] [facility] [place] NAME: Vincent Jackson    MR#:  [id number]  DATE OF BIRTH:  [birth year]  DATE OF ADMISSION:  03/12/2016 ADMITTING PHYSICIAN: Harrie Foreman, MD  DATE OF DISCHARGE: 03/15/16 PRIMARY CARE PHYSICIAN: Tommi Rumps, MD    ADMISSION DIAGNOSIS:  Cough [R05] Bronchitis [J40] Hypoxia [R09.02] Influenza [J11.1]  DISCHARGE DIAGNOSIS:  Active Problems:   Viral sepsis ([place])   Influenza   SECONDARY DIAGNOSIS:   Past Medical History:  Diagnosis Date  . CAD (coronary artery disease)   . Cancer [facility])    Bladder cancer   . Diabetes mellitus without complication ([place])   . Diastolic heart failure ([place])   . Hyperlipidemia   . Prostate cancer [facility])     HOSPITAL COURSE:   HPI: The patient with past medical history of diabetes and heart disease presents emergency department complaining of general malaise and cough. The patient states that he began to feel unwell yesterday. He feels somewhat achy and has had a nonproductive cough that he states has kept him up at night. He denies nausea and chest pain but is unsure if he has a fever. He also denies shortness of breath. The patient was found to be influenza positive at the emergency department. The patient's respiratory rate fluctuated during observation but remained fairly high. Due to his age and comorbidities the emergency department staff as to the hospitalist service for admission.  HOSPITAL COURSE   1. Influenza: Type A; With acute hypoxic respiratory failure Patient needs 2 L of oxygen via nasal cannula Clinically better, but weak Continue Tamiflu adjusted for renal function. 30 mg by mouth once daily for 5 days. Received 4 doses so far. Needs 1 more dose on 03/16/2016 The patient is hemodynamically stable. Hydrated with intravenous fluid. Symptomatic care. Urine cx contaminated Blood cxs 1 bottle- gm positive cocci, defer abx   2. Chronic kidney disease: Stage  III; avoid nephrotoxic agents.  3. Elevated troponin: Likely dueto demand ischemia secondary to viral syndrome and decreased renal clearance.  Patient is clinically asymptomatic  Monitor telemetry. EKG is ventricular pacing.  4. Coronary artery disease: No chest pain; continue aspirin and metoprolol  5. Diabetes mellitus type 2: Hold oral hypoglycemic agents. Sliding scale insulin while hospitalized.  6. Hyperlipidemia: Continue statin therapy  7. DVT prophylaxis: Heparin 8. GI prophylaxis: Pantoprazole per home regimen  Plan of care discussed with the patient. More than 50% time spent on cognition of the care and counseling      DISCHARGE CONDITIONS:   stable  CONSULTS OBTAINED:     PROCEDURES none  DRUG ALLERGIES:  No Known Allergies  DISCHARGE MEDICATIONS:   Current Discharge Medication List    START taking these medications   Details  acetaminophen (TYLENOL) 325 MG tablet Take 2 tablets (650 mg total) by mouth every 6 (six) hours as needed for mild pain (or Fever >/= 101).    benzonatate (TESSALON) 100 MG capsule Take 1 capsule (100 mg total) by mouth 3 (three) times daily as needed for cough. Qty: 20 capsule, Refills: 0    docusate sodium (COLACE) 100 MG capsule Take 1 capsule (100 mg total) by mouth 2 (two) times daily. Qty: 10 capsule, Refills: 0    oseltamivir (TAMIFLU) 30 MG capsule Take 1 capsule (30 mg total) by mouth daily. Qty: 1 capsule, Refills: 0      CONTINUE these medications which have CHANGED   Details  furosemide (LASIX) 20 MG tablet Take  1 tablet (20 mg total) by mouth daily. Qty: 30 tablet, Refills: 0      CONTINUE these medications which have NOT CHANGED   Details  aspirin EC 81 MG tablet Take 81 mg by mouth daily.     glipiZIDE (GLUCOTROL XL) 10 MG 24 hr tablet Take 10 mg by mouth 2 (two) times daily.     latanoprost (XALATAN) 0.005 % ophthalmic solution PLACE ONE DROP INTO EACH EYE EVERY NIGHT    metoprolol succinate  (TOPROL-XL) 25 MG 24 hr tablet Take 25 mg by mouth daily.     Multiple Vitamin (MULTIVITAMIN WITH MINERALS) TABS tablet Take 1 tablet by mouth daily.    pantoprazole (PROTONIX) 40 MG tablet Take 40 mg by mouth daily.     potassium chloride (K-DUR,KLOR-CON) 10 MEQ tablet Take 10 mEq by mouth daily.     simvastatin (ZOCOR) 40 MG tablet Take 40 mg by mouth daily at 6 PM.          DISCHARGE INSTRUCTIONS:   Follow-up with primary care physician in 3-5 days Oxygen 2 L via nasal cannula  DIET:  Cardiac diet, diabetic diet  DISCHARGE CONDITION:  Stable  ACTIVITY:  Activity as tolerated per PT  OXYGEN:  Home Oxygen: Yes.     Oxygen Delivery: 2 liters/min via Patient connected to nasal cannula oxygen  DISCHARGE LOCATION:  nursing home   If you experience worsening of your admission symptoms, develop shortness of breath, life threatening emergency, suicidal or homicidal thoughts you must seek medical attention immediately by calling 911 or calling your MD immediately  if symptoms less severe.  You Must read complete instructions/literature along with all the possible adverse reactions/side effects for all the Medicines you take and that have been prescribed to you. Take any new Medicines after you have completely understood and accpet all the possible adverse reactions/side effects.   Please note  You were cared for by a hospitalist during your hospital stay. If you have any questions about your discharge medications or the care you received while you were in the hospital after you are discharged, you can call the unit and asked to speak with the hospitalist on call if the hospitalist that took care of you is not available. Once you are discharged, your primary care physician will handle any further medical issues. Please note that NO REFILLS for any discharge medications will be authorized once you are discharged, as it is imperative that you return to your primary care physician (or  establish a relationship with a primary care physician if you do not have one) for your aftercare needs so that they can reassess your need for medications and monitor your lab values.     Today  Chief Complaint  Patient presents with  . Influenza   Patient is doing much better couldn't wean off oxygen needing 2 L of oxygen via nasal cannula  ROS:  CONSTITUTIONAL: Denies fevers, chills. Denies any fatigue, weakness.  EYES: Denies blurry vision, double vision, eye pain. EARS, NOSE, THROAT: Denies tinnitus, ear pain, hearing loss. RESPIRATORY: Denies cough, wheeze, shortness of breath.  CARDIOVASCULAR: Denies chest pain, palpitations, edema.  GASTROINTESTINAL: Denies nausea, vomiting, diarrhea, abdominal pain. Denies bright red blood per rectum. GENITOURINARY: Denies dysuria, hematuria. ENDOCRINE: Denies nocturia or thyroid problems. HEMATOLOGIC AND LYMPHATIC: Denies easy bruising or bleeding. SKIN: Denies rash or lesion. MUSCULOSKELETAL: Denies pain in neck, back, shoulder, knees, hips or arthritic symptoms.  NEUROLOGIC: Denies paralysis, paresthesias.  PSYCHIATRIC: Denies anxiety or depressive symptoms.  VITAL SIGNS:  Blood pressure (!) 124/50, pulse 72, temperature 97.4 F (36.3 C), temperature source Oral, resp. rate (!) 22, height 6' (1.829 m), weight 74.6 kg (164 lb 6.4 oz), SpO2 97 %.  I/O:    Intake/Output Summary (Last 24 hours) at 03/15/16 1706 Last data filed at 03/14/16 1911  Gross per 24 hour  Intake              240 ml  Output                0 ml  Net              240 ml    PHYSICAL EXAMINATION:  GENERAL:  83 y.o.-year-old patient lying in the bed with no acute distress.  EYES: Pupils equal, round, reactive to light and accommodation. No scleral icterus. Extraocular muscles intact.  HEENT: Head atraumatic, normocephalic. Oropharynx and nasopharynx clear.  NECK:  Supple, no jugular venous distention. No thyroid enlargement, no tenderness.  LUNGS: Moderate  breath sounds bilaterally, no wheezing, rales,rhonchi or crepitation. No use of accessory muscles of respiration.  CARDIOVASCULAR: S1, S2 normal. No murmurs, rubs, or gallops.  ABDOMEN: Soft, non-tender, non-distended. Bowel sounds present. No organomegaly or mass.  EXTREMITIES: No pedal edema, cyanosis, or clubbing.  NEUROLOGIC: Cranial nerves II through XII are intact. Muscle strength 5/5 in all extremities. Sensation intact. Gait not checked.  PSYCHIATRIC: The patient is alert and oriented x 3.  SKIN: No obvious rash, lesion, or ulcer.   DATA REVIEW:   CBC  Recent Labs Lab 03/15/16 0519  WBC 4.9  HGB 10.7*  HCT 30.8*  PLT 125*    Chemistries   Recent Labs Lab 03/12/16 0326  03/15/16 0519  NA 139  --  137  K 4.4  --  4.4  CL 106  --  101  CO2 28  --  27  GLUCOSE 58*  --  176*  BUN 24*  --  34*  CREATININE 1.61*  < > 1.69*  CALCIUM 8.8*  --  8.5*  AST 30  --   --   ALT 15*  --   --   ALKPHOS 47  --   --   BILITOT 0.7  --   --   < > = values in this interval not displayed.  Cardiac Enzymes  Recent Labs Lab 03/12/16 1048  TROPONINI 0.06*    Microbiology Results  Results for orders placed or performed during the hospital encounter of 03/12/16  Culture, blood (routine x 2)     Status: None (Preliminary result)   Collection Time: 03/12/16  4:58 AM  Result Value Ref Range Status   Specimen Description BLOOD L AC  Final   Special Requests   Final    BOTTLES DRAWN AEROBIC AND ANAEROBIC AER 10ML ANA 13ML   Culture NO GROWTH 3 DAYS  Final   Report Status PENDING  Incomplete  Culture, blood (routine x 2)     Status: Abnormal   Collection Time: 03/12/16  4:58 AM  Result Value Ref Range Status   Specimen Description BLOOD R FA  Final   Special Requests   Final    BOTTLES DRAWN AEROBIC AND ANAEROBIC AER 14ML ANA 11ML   Culture  Setup Time   Final    GRAM POSITIVE COCCI AEROBIC BOTTLE ONLY CRITICAL RESULT CALLED TO, READ BACK BY AND VERIFIED WITH: NATE COOKSON  AT 1308 ON 03/13/16 [place].    Culture (A)  Final    STAPHYLOCOCCUS  SPECIES (COAGULASE NEGATIVE) THE SIGNIFICANCE OF ISOLATING THIS ORGANISM FROM A SINGLE SET OF BLOOD CULTURES WHEN MULTIPLE SETS ARE DRAWN IS UNCERTAIN. PLEASE NOTIFY THE MICROBIOLOGY DEPARTMENT WITHIN ONE WEEK IF SPECIATION AND SENSITIVITIES ARE REQUIRED. Performed at [facility], [place] [address], [place], [place] [address]    Report Status 03/14/2016 FINAL  Final  Blood Culture ID Panel (Reflexed)     Status: Abnormal   Collection Time: 03/12/16  4:58 AM  Result Value Ref Range Status   Enterococcus species NOT DETECTED NOT DETECTED Final   Listeria monocytogenes NOT DETECTED NOT DETECTED Final   Staphylococcus species DETECTED (A) NOT DETECTED Final    Comment: CRITICAL RESULT CALLED TO, READ BACK BY AND VERIFIED WITH: NATE COOKSON AT 3267 ON 03/13/16 [place].    Staphylococcus aureus NOT DETECTED NOT DETECTED Final   Methicillin resistance DETECTED (A) NOT DETECTED Final    Comment: CRITICAL RESULT CALLED TO, READ BACK BY AND VERIFIED WITH: NATE COOKSON AT 1245 ON 03/13/16 [place].    Streptococcus species NOT DETECTED NOT DETECTED Final   Streptococcus agalactiae NOT DETECTED NOT DETECTED Final   Streptococcus pneumoniae NOT DETECTED NOT DETECTED Final   Streptococcus pyogenes NOT DETECTED NOT DETECTED Final   Acinetobacter baumannii NOT DETECTED NOT DETECTED Final   Enterobacteriaceae species NOT DETECTED NOT DETECTED Final   Enterobacter cloacae complex NOT DETECTED NOT DETECTED Final   Escherichia coli NOT DETECTED NOT DETECTED Final   Klebsiella oxytoca NOT DETECTED NOT DETECTED Final   Klebsiella pneumoniae NOT DETECTED NOT DETECTED Final   Proteus species NOT DETECTED NOT DETECTED Final   Serratia marcescens NOT DETECTED NOT DETECTED Final   Haemophilus influenzae NOT DETECTED NOT DETECTED Final   Neisseria meningitidis NOT DETECTED NOT DETECTED Final   Pseudomonas aeruginosa NOT DETECTED NOT DETECTED Final    Candida albicans NOT DETECTED NOT DETECTED Final   Candida glabrata NOT DETECTED NOT DETECTED Final   Candida krusei NOT DETECTED NOT DETECTED Final   Candida parapsilosis NOT DETECTED NOT DETECTED Final   Candida tropicalis NOT DETECTED NOT DETECTED Final  Urine culture     Status: Abnormal   Collection Time: 03/12/16 11:52 AM  Result Value Ref Range Status   Specimen Description URINE, RANDOM  Final   Special Requests NONE  Final   Culture MULTIPLE SPECIES PRESENT, SUGGEST RECOLLECTION (A)  Final   Report Status 03/13/2016 FINAL  Final    RADIOLOGY:  Dg Chest Portable 1 View  Result Date: 03/12/2016 CLINICAL DATA:  Progressive shortness of breath today. Tachycardia, low oxygen saturation. EXAM: PORTABLE CHEST 1 VIEW COMPARISON:  12/05/2013 FINDINGS: Dual lead left-sided pacemaker remains in place. Unchanged heart size and mediastinal contours with borderline cardiomegaly. There is atherosclerosis of the thoracic aorta. There is left basilar scarring with calcified granuloma. Right lung is clear. No pulmonary edema. No pleural fluid or pneumothorax. IMPRESSION: 1. No acute abnormality. 2. Left basilar scarring and calcified granuloma. 3. Thoracic aortic atherosclerosis. Electronically Signed   By: Jeb Levering M.D.   On: 03/12/2016 03:34    EKG:   Orders placed or performed during the hospital encounter of 03/12/16  . ED EKG  . ED EKG  . EKG 12-Lead  . EKG 12-Lead  . EKG 12-Lead  . EKG 12-Lead      Management plans discussed with the patient, family and they are in agreement.  CODE STATUS:     Code Status Orders        Start     Ordered  03/12/16 1152  Full code  Continuous     03/12/16 1151    Code Status History    Date Active Date Inactive Code Status Order ID Comments User Context   This patient has a current code status but no historical code status.    Advance Directive Documentation   Flowsheet Row Most Recent Value  Type of Advance Directive  Out of  facility DNR (pink MOST or yellow form)  Pre-existing out of facility DNR order (yellow form or pink MOST form)  Yellow form placed in chart (order not valid for inpatient use)  "MOST" Form in Place?  No data      TOTAL TIME TAKING CARE OF THIS PATIENT: 45  minutes.   Note: This dictation was prepared with Dragon dictation along with smaller phrase technology. Any transcriptional errors that result from this process are unintentional.   @MEC @  on 03/15/2016 at 5:06 PM  Between 7am to 6pm - Pager - [phone]  After 6pm go to [email or web address] - password [email or web address] [facility]  [place] [facility]  Office  [phone]  CC: Primary care physician; Tommi Rumps, MD

## 2016-03-15 NOTE — Progress Notes (Signed)
SATURATION QUALIFICATIONS: (This note is used to comply with regulatory documentation for home oxygen)  Patient Saturations on Room Air at Rest = 89%  Patient Saturations on Room Air while Ambulating = 88%  Patient Saturations on 2Liters of oxygen while Ambulating = 93%  Please briefly explain why patient needs home oxygen:

## 2016-03-15 NOTE — NC FL2 (Signed)
[place] LEVEL OF CARE SCREENING TOOL     IDENTIFICATION  Patient Name: Vincent Jackson Birthdate: 01-05-1920 Sex: male Admission Date (Current Location): 03/12/2016  [place] and [place] Number:  Engineering geologist and Address:  [facility], [address], [place], [place] [address]      Provider Number: [id number]  Attending Physician Name and Address:  [name], MD  Relative Name and Phone Number:  Ronaldo Miyamoto [phone]     Current Level of Care: Hospital Recommended Level of Care: [place] Prior Approval Number:    Date Approved/Denied:   PASRR Number: [id number]  Discharge Plan: SNF    Current Diagnoses: Patient Active Problem List   Diagnosis Date Noted  . Viral sepsis ([place]) 03/12/2016  . Influenza 03/12/2016  . PVD (peripheral vascular disease) ([place]) 01/24/2016  . Decreased pedal pulses 12/16/2015  . History of bladder cancer 12/16/2015  . CAD (coronary artery disease) 09/09/2015  . Hyperlipidemia 09/09/2015  . Diabetes ([place]) 09/09/2015    Orientation RESPIRATION BLADDER Height & Weight     Self, Time, Situation, Place  O2 (2L) Continent Weight: 164 lb 6.4 oz (74.6 kg) Height:  6' (182.9 cm)  BEHAVIORAL SYMPTOMS/MOOD NEUROLOGICAL BOWEL NUTRITION STATUS      Continent Diet (Carb Modified)  AMBULATORY STATUS COMMUNICATION OF NEEDS Skin   Supervision Verbally Normal                       Personal Care Assistance Level of Assistance  Bathing, Feeding, Dressing Bathing Assistance: Limited assistance Feeding assistance: Independent Dressing Assistance: Independent     Functional Limitations Info  Sight, Hearing, Speech Sight Info: Adequate Hearing Info: Adequate Speech Info: Adequate    SPECIAL CARE FACTORS FREQUENCY  PT (By licensed PT) (No PT Follow up)                    Contractures Contractures Info: Not present    Additional Factors Info  Code Status,  Allergies, Insulin Sliding Scale Code Status Info: Full  Allergies Info: NKA   Insulin Sliding Scale Info: insulin aspart (novoLOG) injection 0-5 Units       Current Medications (03/15/2016):  This is the current hospital active medication list Current Facility-Administered Medications  Medication Dose Route Frequency Provider Last Rate Last Dose  . acetaminophen (TYLENOL) tablet 650 mg  650 mg Oral Q6H PRN Harrie Foreman, MD   650 mg at 03/13/16 0755   Or  . acetaminophen (TYLENOL) suppository 650 mg  650 mg Rectal Q6H PRN Harrie Foreman, MD      . aspirin EC tablet 81 mg  81 mg Oral Daily Harrie Foreman, MD   81 mg at 03/15/16 1497  . benzonatate (TESSALON) capsule 100 mg  100 mg Oral TID PRN Harrie Foreman, MD   100 mg at 03/12/16 1231  . docusate sodium (COLACE) capsule 100 mg  100 mg Oral BID Harrie Foreman, MD   100 mg at 03/15/16 0263  . furosemide (LASIX) tablet 20 mg  20 mg Oral Daily Harrie Foreman, MD   20 mg at 03/15/16 0823  . heparin injection 5,000 Units  5,000 Units Subcutaneous Q8H Harrie Foreman, MD   5,000 Units at 03/15/16 0604  . insulin aspart (novoLOG) injection 0-5 Units  0-5 Units Subcutaneous QHS Harrie Foreman, MD      . insulin aspart (novoLOG) injection 0-9 Units  0-9 Units Subcutaneous  TID WC Harrie Foreman, MD   3 Units at 03/15/16 1218  . latanoprost (XALATAN) 0.005 % ophthalmic solution 1 drop  1 drop Both Eyes QHS Harrie Foreman, MD   1 drop at 03/14/16 2207  . metoprolol succinate (TOPROL-XL) 24 hr tablet 25 mg  25 mg Oral Daily Harrie Foreman, MD   25 mg at 03/15/16 1275  . multivitamin with minerals tablet 1 tablet  1 tablet Oral Daily Harrie Foreman, MD   1 tablet at 03/15/16 [phone]  . ondansetron (ZOFRAN) tablet 4 mg  4 mg Oral Q6H PRN Harrie Foreman, MD       Or  . ondansetron [facility]) injection 4 mg  4 mg Intravenous Q6H PRN Harrie Foreman, MD      . Derrill Memo ON 03/16/2016] oseltamivir (TAMIFLU) capsule 30 mg  30  mg Oral Daily Harrie Foreman, MD      . pantoprazole (PROTONIX) EC tablet 40 mg  40 mg Oral Daily Harrie Foreman, MD   40 mg at 03/15/16 1749  . potassium chloride (K-DUR,KLOR-CON) CR tablet 10 mEq  10 mEq Oral Daily Harrie Foreman, MD   10 mEq at 03/15/16 4496  . simvastatin (ZOCOR) tablet 40 mg  40 mg Oral q1800 Harrie Foreman, MD   40 mg at 03/14/16 1703  . sodium chloride flush (NS) 0.9 % injection 3 mL  3 mL Intravenous Q12H Harrie Foreman, MD   3 mL at 03/15/16 1000     Discharge Medications: Please see discharge summary for a list of discharge medications.  Relevant Imaging Results:  Relevant Lab Results:   Additional Information SSN [id number]  Ross Ludwig, LCSWA

## 2016-03-15 NOTE — Clinical Social Work Placement (Signed)
   CLINICAL SOCIAL WORK PLACEMENT  NOTE  Date:  03/15/2016  Patient Details  Name: Vincent Jackson MRN: [id number] Date of Birth: 27-Oct-1919  Clinical Social Work is seeking post-discharge placement for this patient at the [place] level of care (*CSW will initial, date and re-position this form in  chart as items are completed):  Yes   Patient/family provided with [place] Work Department's list of facilities offering this level of care within the geographic area requested by the patient (or if unable, by the patient's family).  Yes   Patient/family informed of their freedom to choose among providers that offer the needed level of care, that participate in Medicare, Medicaid or managed care program needed by the patient, have an available bed and are willing to accept the patient.  Yes   Patient/family informed of [facility] ownership interest in [facility] and [facility], as well as of the fact that they are under no obligation to receive care at these facilities.  PASRR submitted to EDS on 03/15/16     PASRR number received on       Existing PASRR number confirmed on 03/15/16     FL2 transmitted to all facilities in geographic area requested by pt/family on       FL2 transmitted to all facilities within larger geographic area on       Patient informed that his/her managed care company has contracts with or will negotiate with certain facilities, including the following:        Yes   Patient/family informed of bed offers received.  Patient [facility] bed at [facility]     Physician recommends and patient chooses bed at      Patient to be transferred to [facility] on 03/15/16.  Patient to be transferred to facility by Facility transportation     Patient family notified on 03/15/16 of transfer.  Name of family member notified:  Patient notified his wife     PHYSICIAN       Additional Comment:     _______________________________________________ Ross Ludwig, LCSWA 03/15/2016, 9:52 PM

## 2016-03-15 NOTE — Discharge Instructions (Signed)
Follow-up with primary care physician in 3-5 days Oxygen 2 L via nasal cannula

## 2016-03-15 NOTE — Care Management (Signed)
Patient wants to discharge home today and attending says is medically stable. He has qualified for home oxygen.  [facility] is not able to to deliver after 2:30p due to weather.  [facility] will be able to deliver and will have a portable tank brought to hospital room within 1-2 hours.  Have placed called to [facility] and was informed to leave a message with Gara Kroner to discuss transportation back to [facility].  Achille Rich had relayed that  patient's wife is in the health care building and it was hoped patient could go to that unit. Discussed that patient has not had a qualifying medicare stay.  Patient later relayed to CM that he does want to go and be with his wife and not go back to his apartment.    Patient presently is refusing to return by ems.  This affects how to provide oxygen for transport.  If patient goes to the skilled nursing unit and insists on transport by car- [facility] will have to bring a portable tank if they transport.  Gara Kroner the nurse from independent living will speak with patient.  CM canceled the delivery of the portable tank. CSW is now involved

## 2016-03-15 NOTE — Progress Notes (Signed)
IV and tele removed from patient. Report called to [place]. [facility] will be coming to transport patient back to facility. DNR form placed in patient packet. Patient belongings returned by security. No distress at this time.

## 2016-03-15 NOTE — Progress Notes (Signed)
PHARMACY NOTE:  ANTIMICROBIAL RENAL DOSAGE ADJUSTMENT  Current antimicrobial regimen includes a mismatch between antimicrobial dosage and estimated renal function.  Current antimicrobial dosage:  oseltamivir 30 mg BID  Indication: Influ A +  Renal Function: Estimated Creatinine Clearance: 28.2 mL/min (by C-G formula based on SCr of 1.69 mg/dL (H)).  Antimicrobial dosage has been changed to:  oseltamivir 30 mg daily    Thank you for allowing pharmacy to be a part of this patient's care.  Rocky Morel, [facility] 03/15/2016 11:26 AM

## 2016-03-15 NOTE — Clinical Social Work Note (Signed)
Clinical Social Work Assessment  Patient Details  Name: Vincent Jackson MRN: [id number] Date of Birth: Oct 31, 1919  Date of referral:  03/15/16               Reason for consult:  Facility Placement                Permission sought to share information with:  Facility Sport and exercise psychologist Permission granted to share information::  Yes, Verbal Permission Granted  Name::     Ronaldo Miyamoto [phone]   Agency::  SNF admissions  Relationship::     Contact Information:     Housing/Transportation Living arrangements for the past 2 months:  [place] ([place]) Source of Information:  Medical Team Patient Interpreter Needed:  None Criminal Activity/Legal Involvement Pertinent to Current Situation/Hospitalization:  No - Comment as needed Significant Relationships:  Spouse Lives with:  Spouse Do you feel safe going back to the place where you live?  No Need for family participation in patient care:  No (Coment)  Care giving concerns:  Patient wants to return to [facility] which is where his wife is currently receiving therapy.   Social Worker assessment / plan:  Patient is a 83 year old male who is married and lives with his wife at [facility].  Paitent's wife is currently receiving rehab at [facility]  Patient does not want to go home right now, he wants to stay at [facility] with his wife as a respite patient.  Patient expressed he is ready for discharge today, and does not want to wait till tomorrow.  Patient did not express any other issues or concerns.  Employment status:  Retired Forensic scientist:  Medicare PT Recommendations:  No Follow Up Information / Referral to community resources:  [place]  Patient/Family's Response to care:  Patient plans to go to [facility] for respite with his wife.  Patient/Family's Understanding of and Emotional Response to  Diagnosis, Current Treatment, and Prognosis:  Patient is motivated to go to [place] [facility] while she recovers.  Emotional Assessment Appearance:  Appears stated age Attitude/Demeanor/Rapport:    Affect (typically observed):  Appropriate, Calm Orientation:  Oriented to Self, Oriented to Place, Oriented to  Time, Oriented to Situation Alcohol / Substance use:  Not Applicable Psych involvement (Current and /or in the community):  No (Comment)  Discharge Needs  Concerns to be addressed:  No discharge needs identified Readmission within the last 30 days:  No Current discharge risk:  None Barriers to Discharge:  No Barriers Identified   Ross Ludwig, LCSWA 03/15/2016, 9:42 PM

## 2016-03-15 NOTE — Care Management (Signed)
Patient is still requiring acute 02.  he does have a history of chronic diastolic heart failure. Discussed during progression the need to assess for home 02. No physical therapy needs but if agreeable, may benefit from home health nurse especially is requires home 02.

## 2016-03-16 ENCOUNTER — Telehealth: Payer: Self-pay | Admitting: Family Medicine

## 2016-03-16 DIAGNOSIS — I2510 Atherosclerotic heart disease of native coronary artery without angina pectoris: Secondary | ICD-10-CM | POA: Diagnosis not present

## 2016-03-16 DIAGNOSIS — N183 Chronic kidney disease, stage 3 (moderate): Secondary | ICD-10-CM | POA: Diagnosis not present

## 2016-03-16 DIAGNOSIS — J9600 Acute respiratory failure, unspecified whether with hypoxia or hypercapnia: Secondary | ICD-10-CM | POA: Diagnosis not present

## 2016-03-16 DIAGNOSIS — J112 Influenza due to unidentified influenza virus with gastrointestinal manifestations: Secondary | ICD-10-CM | POA: Diagnosis not present

## 2016-03-16 DIAGNOSIS — E119 Type 2 diabetes mellitus without complications: Secondary | ICD-10-CM | POA: Diagnosis not present

## 2016-03-16 NOTE — Telephone Encounter (Signed)
We can still TCM this patient if I know when he is released from Rehab. FYI

## 2016-03-16 NOTE — Telephone Encounter (Signed)
Noted  

## 2016-03-16 NOTE — Telephone Encounter (Signed)
fyi

## 2016-03-16 NOTE — Telephone Encounter (Signed)
Fyi - Pt was sent to the hospital on Sunday for a really bad case of the flu, he has since been released but is now in Rehab. Pt had appt scheduled for 1/22 but cancelled. Once released from rehab, will call back to reschedule.

## 2016-03-17 LAB — CULTURE, BLOOD (ROUTINE X 2): CULTURE: NO GROWTH

## 2016-03-19 ENCOUNTER — Non-Acute Institutional Stay (SKILLED_NURSING_FACILITY): Payer: Medicare Other | Admitting: Gerontology

## 2016-03-19 ENCOUNTER — Ambulatory Visit: Payer: Medicare Other | Admitting: Family Medicine

## 2016-03-19 DIAGNOSIS — J101 Influenza due to other identified influenza virus with other respiratory manifestations: Secondary | ICD-10-CM

## 2016-03-19 NOTE — Progress Notes (Signed)
Location:      Place of Service:  SNF (31)  Provider: Toni Arthurs, NP-C  PCP: Tommi Rumps, MD Patient Care Team: Leone Haven, MD as PCP - General (Family Medicine)  Extended Emergency Contact Information Primary Emergency Contact: Cheek,Estelle Address: [address]          [place], [place] [address] [place] Phone: [phone] Relation: Spouse  Code Status: dnr Goals of care:  Advanced Directive information Advanced Directives 03/12/2016  Does Patient Have a Medical Advance Directive? Yes  Type of Advance Directive Out of facility DNR (pink MOST or yellow form)  Does patient want to make changes to medical advance directive? No - Patient declined  Would patient like information on creating a medical advance directive? No - Patient declined  Pre-existing out of facility DNR order (yellow form or pink MOST form) Yellow form placed in chart (order not valid for inpatient use)     No Known Allergies  Chief Complaint  Patient presents with  . Discharge Note    HPI:  83 y.o. male seen today for discharge evaluation. Pt was admitted to the facility on 1/18 with diagnosis of Influenza. Pt had received proper course of Tamiflu from the hospital with only one day of treatment remaining upon admission. He was admitted to the facility for respite while recovering. Pt did not receive any therapy services while here. He is ambulatory with a Rolator walker. Pt reports he is feeling much better and ready to return home. He reports his appetite is good, regular bowel movements. No concerns about returning home. Pt denies n/v/d/f/c/cp/sob/ha/abdpain/dizziness. VSS. No other complaints.    Past Medical History:  Diagnosis Date  . CAD (coronary artery disease)   . Cancer [facility])    Bladder cancer   . Diabetes mellitus without complication ([place])   . Diastolic heart failure ([place])   . Hyperlipidemia   . Prostate cancer [facility])     Past  Surgical History:  Procedure Laterality Date  . BLADDER REMOVAL  2007  . MASTOIDECTOMY    . PROSTATE SURGERY  2007   removed   . TONSILLECTOMY        reports that he quit smoking about 18 years ago. He has never used smokeless tobacco. He reports that he drinks alcohol. He reports that he does not use drugs. Social History   Social History  . Marital status: Married    Spouse name: N/A  . Number of children: N/A  . Years of education: N/A   Occupational History  . Not on file.   Social History Main Topics  . Smoking status: Former Smoker    Quit date: 09/08/1997  . Smokeless tobacco: Never Used  . Alcohol use 0.0 oz/week     Comment: Occasional   . Drug use: No  . Sexual activity: Not on file   Other Topics Concern  . Not on file   Social History Narrative  . No narrative on file   Functional Status Survey:    No Known Allergies  Pertinent  Health Maintenance Due  Topic Date Due  . OPHTHALMOLOGY EXAM  11/13/1931  . URINE MICROALBUMIN  11/13/1931  . PNA vac Low Risk Adult (1 of 2 - PCV13) 11/13/1986  . HEMOGLOBIN A1C  06/15/2016  . FOOT EXAM  12/15/2016  . INFLUENZA VACCINE  Completed    Medications: Allergies as of 03/19/2016   No Known Allergies  Medication List       Accurate as of 03/19/16 11:56 AM. Always use your most recent med list.          acetaminophen 325 MG tablet Commonly known as:  TYLENOL Take 2 tablets (650 mg total) by mouth every 6 (six) hours as needed for mild pain (or Fever >/= 101).   aspirin EC 81 MG tablet Take 81 mg by mouth daily.   benzonatate 100 MG capsule Commonly known as:  TESSALON Take 1 capsule (100 mg total) by mouth 3 (three) times daily as needed for cough.   docusate sodium 100 MG capsule Commonly known as:  COLACE Take 1 capsule (100 mg total) by mouth 2 (two) times daily.   furosemide 20 MG tablet Commonly known as:  LASIX Take 1 tablet (20 mg total) by mouth daily.   glipiZIDE 10 MG 24 hr  tablet Commonly known as:  GLUCOTROL XL Take 10 mg by mouth 2 (two) times daily.   latanoprost 0.005 % ophthalmic solution Commonly known as:  XALATAN PLACE ONE DROP INTO EACH EYE EVERY NIGHT   metoprolol succinate 25 MG 24 hr tablet Commonly known as:  TOPROL-XL Take 25 mg by mouth daily.   multivitamin with minerals Tabs tablet Take 1 tablet by mouth daily.   oseltamivir 30 MG capsule Commonly known as:  TAMIFLU Take 1 capsule (30 mg total) by mouth daily.   pantoprazole 40 MG tablet Commonly known as:  PROTONIX Take 40 mg by mouth daily.   potassium chloride 10 MEQ tablet Commonly known as:  K-DUR,KLOR-CON Take 10 mEq by mouth daily.   simvastatin 40 MG tablet Commonly known as:  ZOCOR Take 40 mg by mouth daily at 6 PM.       Review of Systems  Constitutional: Negative for activity change, appetite change, chills, diaphoresis and fever.  HENT: Negative for congestion, sneezing, sore throat, trouble swallowing and voice change.   Respiratory: Negative for apnea, cough, choking, chest tightness, shortness of breath and wheezing.   Cardiovascular: Negative for chest pain, palpitations and leg swelling.  Gastrointestinal: Negative for abdominal distention, abdominal pain, constipation, diarrhea and nausea.  Genitourinary: Negative for difficulty urinating, dysuria, frequency and urgency.  Musculoskeletal: Negative for back pain, gait problem and myalgias. Arthralgias: typical arthritis.  Skin: Negative for color change, pallor, rash and wound.  Neurological: Negative for dizziness, tremors, syncope, speech difficulty, weakness, numbness and headaches.  Psychiatric/Behavioral: Negative for agitation and behavioral problems.  All other systems reviewed and are negative.   Vitals:   03/19/16 0600  BP: 130/67  Pulse: 75  Resp: 16  Temp: 98.4 F (36.9 C)  SpO2: 90%   There is no height or weight on file to calculate BMI. Physical Exam  Constitutional: He is  oriented to person, place, and time. Vital signs are normal. He appears well-developed and well-nourished. He is active and cooperative. He does not appear ill. No distress.  HENT:  Head: Normocephalic and atraumatic.  Mouth/Throat: Uvula is midline, oropharynx is clear and moist and mucous membranes are normal. Mucous membranes are not pale, not dry and not cyanotic.  Eyes: Conjunctivae, EOM and lids are normal. Pupils are equal, round, and reactive to light.  Neck: Trachea normal, normal range of motion and full passive range of motion without pain. Neck supple. No JVD present. No tracheal deviation, no edema and no erythema present. No thyromegaly present.  Cardiovascular: Normal rate, regular rhythm, normal heart sounds, intact distal pulses and normal pulses.  Exam  reveals no gallop, no distant heart sounds and no friction rub.   No murmur heard. Pulmonary/Chest: Effort normal and breath sounds normal. No accessory muscle usage. No respiratory distress. He has no wheezes. He has no rales. He exhibits no tenderness.  Abdominal: Normal appearance and bowel sounds are normal. He exhibits no distension and no ascites. There is no tenderness.  Musculoskeletal: Normal range of motion. He exhibits no edema or tenderness.  Expected osteoarthritis, stiffness  Neurological: He is alert and oriented to person, place, and time. He has normal strength.  Skin: Skin is warm, dry and intact. No rash noted. He is not diaphoretic. No cyanosis or erythema. No pallor. Nails show no clubbing.  Psychiatric: He has a normal mood and affect. His speech is normal and behavior is normal. Judgment and thought content normal. Cognition and memory are normal.  Nursing note and vitals reviewed.   Labs reviewed: Basic Metabolic Panel:  Recent Labs  12/16/15 1410 03/12/16 0326 03/13/16 0452 03/15/16 0519  NA 138 139  --  137  K 5.1 4.4  --  4.4  CL 104 106  --  101  CO2 28 28  --  27  GLUCOSE 181* 58*  --  176*    BUN 32* 24*  --  34*  CREATININE 1.57* 1.61* 1.60* 1.69*  CALCIUM 9.1 8.8*  --  8.5*   Liver Function Tests:  Recent Labs  03/12/16 0326  AST 30  ALT 15*  ALKPHOS 47  BILITOT 0.7  PROT 6.9  ALBUMIN 4.0   No results for input(s): LIPASE, AMYLASE in the last 8760 hours. No results for input(s): AMMONIA in the last 8760 hours. CBC:  Recent Labs  03/12/16 0326 03/13/16 0452 03/15/16 0519  WBC 3.8 4.0 4.9  NEUTROABS 2.8 2.9  --   HGB 10.6* 9.8* 10.7*  HCT 30.6* 28.6* 30.8*  MCV 90.4 91.5 90.2  PLT 121* 104* 125*   Cardiac Enzymes:  Recent Labs  03/12/16 0326 03/12/16 1048  TROPONINI 0.05* 0.06*   BNP: Invalid input(s): POCBNP CBG:  Recent Labs  03/14/16 2138 03/15/16 0743 03/15/16 1203  GLUCAP 161* 177* 226*    Procedures and Imaging Studies During Stay: Dg Chest Portable 1 View  Result Date: 03/12/2016 CLINICAL DATA:  Progressive shortness of breath today. Tachycardia, low oxygen saturation. EXAM: PORTABLE CHEST 1 VIEW COMPARISON:  12/05/2013 FINDINGS: Dual lead left-sided pacemaker remains in place. Unchanged heart size and mediastinal contours with borderline cardiomegaly. There is atherosclerosis of the thoracic aorta. There is left basilar scarring with calcified granuloma. Right lung is clear. No pulmonary edema. No pleural fluid or pneumothorax. IMPRESSION: 1. No acute abnormality. 2. Left basilar scarring and calcified granuloma. 3. Thoracic aortic atherosclerosis. Electronically Signed   By: Jeb Levering M.D.   On: 03/12/2016 03:34    Assessment/Plan:   1. Influenza A  Resolved  Continue supportive therapies at home  Increase po fluid intake  Change positions slowly  Get plenty of rest  Limit exposure to others  Call MD if symptoms return   Patient is being discharged with the following home health services:  None  Patient is being discharged with the following durable medical equipment:  none  Patient has been advised to f/u  with their PCP in 1-2 weeks to bring them up to date on their rehab stay.  Social services at facility was responsible for arranging this appointment.  Pt was provided with a 30 day supply of prescriptions for medications and refills must  be obtained from their PCP.  For controlled substances, a more limited supply may be provided adequate until PCP appointment only.  Future labs/tests needed:  none  Family/ staff Communication:   Total Time:  Documentation:  Face to Face:  Family/Phone:  Vikki Ports, NP-C [facility] [address], [place] [address] Cell Phone (Mon-Fri 8am-5pm):  [phone] On Call:  [phone] & follow prompts after 5pm & weekends Office Phone:  [phone] Office Fax:  [phone]

## 2016-03-29 ENCOUNTER — Encounter: Admission: RE | Admit: 2016-03-29 | Payer: Medicare Other | Source: Ambulatory Visit | Admitting: Internal Medicine

## 2016-04-16 ENCOUNTER — Observation Stay
Admission: EM | Admit: 2016-04-16 | Discharge: 2016-04-17 | Disposition: A | Discharge status: Home / Self Care | Payer: Medicare Other | Attending: Internal Medicine | Admitting: Internal Medicine

## 2016-04-16 ENCOUNTER — Emergency Department: Payer: Medicare Other

## 2016-04-16 ENCOUNTER — Observation Stay: Payer: Medicare Other

## 2016-04-16 ENCOUNTER — Encounter: Payer: Self-pay | Admitting: Emergency Medicine

## 2016-04-16 DIAGNOSIS — E119 Type 2 diabetes mellitus without complications: Secondary | ICD-10-CM | POA: Diagnosis not present

## 2016-04-16 DIAGNOSIS — Z906 Acquired absence of other parts of urinary tract: Secondary | ICD-10-CM | POA: Diagnosis not present

## 2016-04-16 DIAGNOSIS — Z7984 Long term (current) use of oral hypoglycemic drugs: Secondary | ICD-10-CM | POA: Insufficient documentation

## 2016-04-16 DIAGNOSIS — N183 Chronic kidney disease, stage 3 (moderate): Secondary | ICD-10-CM | POA: Insufficient documentation

## 2016-04-16 DIAGNOSIS — J9610 Chronic respiratory failure, unspecified whether with hypoxia or hypercapnia: Secondary | ICD-10-CM | POA: Diagnosis not present

## 2016-04-16 DIAGNOSIS — K5730 Diverticulosis of large intestine without perforation or abscess without bleeding: Secondary | ICD-10-CM | POA: Diagnosis not present

## 2016-04-16 DIAGNOSIS — Z950 Presence of cardiac pacemaker: Secondary | ICD-10-CM | POA: Diagnosis not present

## 2016-04-16 DIAGNOSIS — E1122 Type 2 diabetes mellitus with diabetic chronic kidney disease: Secondary | ICD-10-CM | POA: Insufficient documentation

## 2016-04-16 DIAGNOSIS — M47816 Spondylosis without myelopathy or radiculopathy, lumbar region: Secondary | ICD-10-CM | POA: Insufficient documentation

## 2016-04-16 DIAGNOSIS — Z8551 Personal history of malignant neoplasm of bladder: Secondary | ICD-10-CM | POA: Insufficient documentation

## 2016-04-16 DIAGNOSIS — Z9889 Other specified postprocedural states: Secondary | ICD-10-CM | POA: Insufficient documentation

## 2016-04-16 DIAGNOSIS — I5032 Chronic diastolic (congestive) heart failure: Secondary | ICD-10-CM | POA: Insufficient documentation

## 2016-04-16 DIAGNOSIS — D631 Anemia in chronic kidney disease: Secondary | ICD-10-CM | POA: Insufficient documentation

## 2016-04-16 DIAGNOSIS — M79662 Pain in left lower leg: Secondary | ICD-10-CM | POA: Diagnosis not present

## 2016-04-16 DIAGNOSIS — I2510 Atherosclerotic heart disease of native coronary artery without angina pectoris: Secondary | ICD-10-CM | POA: Diagnosis not present

## 2016-04-16 DIAGNOSIS — Z8546 Personal history of malignant neoplasm of prostate: Secondary | ICD-10-CM | POA: Insufficient documentation

## 2016-04-16 DIAGNOSIS — S3991XA Unspecified injury of abdomen, initial encounter: Secondary | ICD-10-CM | POA: Diagnosis not present

## 2016-04-16 DIAGNOSIS — Z66 Do not resuscitate: Secondary | ICD-10-CM | POA: Insufficient documentation

## 2016-04-16 DIAGNOSIS — M25522 Pain in left elbow: Secondary | ICD-10-CM | POA: Diagnosis not present

## 2016-04-16 DIAGNOSIS — E785 Hyperlipidemia, unspecified: Secondary | ICD-10-CM | POA: Insufficient documentation

## 2016-04-16 DIAGNOSIS — Z7982 Long term (current) use of aspirin: Secondary | ICD-10-CM | POA: Diagnosis not present

## 2016-04-16 DIAGNOSIS — W19XXXA Unspecified fall, initial encounter: Secondary | ICD-10-CM | POA: Insufficient documentation

## 2016-04-16 DIAGNOSIS — I130 Hypertensive heart and chronic kidney disease with heart failure and stage 1 through stage 4 chronic kidney disease, or unspecified chronic kidney disease: Secondary | ICD-10-CM | POA: Diagnosis not present

## 2016-04-16 DIAGNOSIS — Z87891 Personal history of nicotine dependence: Secondary | ICD-10-CM | POA: Diagnosis not present

## 2016-04-16 DIAGNOSIS — R1030 Lower abdominal pain, unspecified: Secondary | ICD-10-CM | POA: Diagnosis not present

## 2016-04-16 DIAGNOSIS — M25559 Pain in unspecified hip: Secondary | ICD-10-CM | POA: Diagnosis present

## 2016-04-16 DIAGNOSIS — M25552 Pain in left hip: Secondary | ICD-10-CM | POA: Diagnosis not present

## 2016-04-16 DIAGNOSIS — H409 Unspecified glaucoma: Secondary | ICD-10-CM | POA: Diagnosis not present

## 2016-04-16 DIAGNOSIS — R748 Abnormal levels of other serum enzymes: Secondary | ICD-10-CM | POA: Diagnosis not present

## 2016-04-16 LAB — GLUCOSE, CAPILLARY: GLUCOSE-CAPILLARY: 187 mg/dL — AB (ref 65–99)

## 2016-04-16 LAB — CBC WITH DIFFERENTIAL/PLATELET
BASOS PCT: 1 %
Basophils Absolute: 0.1 10*3/uL (ref 0–0.1)
EOS ABS: 0.1 10*3/uL (ref 0–0.7)
Eosinophils Relative: 2 %
HCT: 29.9 % — ABNORMAL LOW (ref 40.0–52.0)
HEMOGLOBIN: 10.3 g/dL — AB (ref 13.0–18.0)
Lymphocytes Relative: 17 %
Lymphs Abs: 1 10*3/uL (ref 1.0–3.6)
MCH: 31.3 pg (ref 26.0–34.0)
MCHC: 34.4 g/dL (ref 32.0–36.0)
MCV: 90.9 fL (ref 80.0–100.0)
Monocytes Absolute: 0.6 10*3/uL (ref 0.2–1.0)
Monocytes Relative: 9 %
NEUTROS PCT: 71 %
Neutro Abs: 4.5 10*3/uL (ref 1.4–6.5)
PLATELETS: 146 10*3/uL — AB (ref 150–440)
RBC: 3.28 MIL/uL — AB (ref 4.40–5.90)
RDW: 14.2 % (ref 11.5–14.5)
WBC: 6.3 10*3/uL (ref 3.8–10.6)

## 2016-04-16 LAB — COMPREHENSIVE METABOLIC PANEL
ALK PHOS: 49 U/L (ref 38–126)
ALT: 17 U/L (ref 17–63)
ANION GAP: 5 (ref 5–15)
AST: 30 U/L (ref 15–41)
Albumin: 3.9 g/dL (ref 3.5–5.0)
BILIRUBIN TOTAL: 0.6 mg/dL (ref 0.3–1.2)
BUN: 25 mg/dL — ABNORMAL HIGH (ref 6–20)
CALCIUM: 8.7 mg/dL — AB (ref 8.9–10.3)
CO2: 26 mmol/L (ref 22–32)
Chloride: 106 mmol/L (ref 101–111)
Creatinine, Ser: 1.68 mg/dL — ABNORMAL HIGH (ref 0.61–1.24)
GFR, EST AFRICAN AMERICAN: 38 mL/min — AB (ref >60)
GFR, EST NON AFRICAN AMERICAN: 33 mL/min — AB (ref >60)
Glucose, Bld: 103 mg/dL — ABNORMAL HIGH (ref 65–99)
Potassium: 4.3 mmol/L (ref 3.5–5.1)
SODIUM: 137 mmol/L (ref 135–145)
TOTAL PROTEIN: 6.7 g/dL (ref 6.5–8.1)

## 2016-04-16 LAB — TROPONIN I: Troponin I: 0.03 ng/mL (ref <0.03)

## 2016-04-16 LAB — PROTIME-INR
INR: 1.04
PROTHROMBIN TIME: 13.6 s (ref 11.4–15.2)

## 2016-04-16 MED ORDER — HYDROCODONE-ACETAMINOPHEN 5-325 MG PO TABS: 1.0000 | ORAL_TABLET | ORAL | Status: DC | PRN

## 2016-04-16 MED ORDER — INSULIN ASPART 100 UNIT/ML ~~LOC~~ SOLN
0.0000 [IU] | Freq: Three times a day (TID) | SUBCUTANEOUS | Status: DC
  Administered 2016-04-17: 2 [IU] via SUBCUTANEOUS
  Filled 2016-04-16: qty 2

## 2016-04-16 MED ORDER — GLIPIZIDE ER 10 MG PO TB24
10.0000 mg | ORAL_TABLET | Freq: Two times a day (BID) | ORAL | Status: DC
  Administered 2016-04-17: 10 mg via ORAL
  Filled 2016-04-16: qty 1

## 2016-04-16 MED ORDER — SODIUM CHLORIDE 0.9 % IV SOLN
INTRAVENOUS | Status: DC
  Administered 2016-04-16: 22:00:00 via INTRAVENOUS

## 2016-04-16 MED ORDER — ONDANSETRON HCL 4 MG PO TABS: 4.0000 mg | ORAL_TABLET | Freq: Four times a day (QID) | ORAL | Status: DC | PRN

## 2016-04-16 MED ORDER — DOCUSATE SODIUM 100 MG PO CAPS
100.0000 mg | ORAL_CAPSULE | Freq: Two times a day (BID) | ORAL | Status: DC
  Administered 2016-04-16 – 2016-04-17 (×2): 100 mg via ORAL
  Filled 2016-04-16 (×2): qty 1

## 2016-04-16 MED ORDER — ASPIRIN EC 81 MG PO TBEC: 81.0000 mg | DELAYED_RELEASE_TABLET | Freq: Every day | ORAL | Status: DC

## 2016-04-16 MED ORDER — ADULT MULTIVITAMIN W/MINERALS CH
1.0000 | ORAL_TABLET | Freq: Every day | ORAL | Status: DC
  Administered 2016-04-17: 1 via ORAL
  Filled 2016-04-16: qty 1

## 2016-04-16 MED ORDER — HEPARIN SODIUM (PORCINE) 5000 UNIT/ML IJ SOLN
5000.0000 [IU] | Freq: Three times a day (TID) | INTRAMUSCULAR | Status: DC
  Administered 2016-04-16 – 2016-04-17 (×3): 5000 [IU] via SUBCUTANEOUS
  Filled 2016-04-16 (×3): qty 1

## 2016-04-16 MED ORDER — OSELTAMIVIR PHOSPHATE 30 MG PO CAPS: 30.0000 mg | ORAL_CAPSULE | Freq: Every day | ORAL | Status: DC

## 2016-04-16 MED ORDER — TRAZODONE HCL 50 MG PO TABS: 25.0000 mg | ORAL_TABLET | Freq: Every evening | ORAL | Status: DC | PRN

## 2016-04-16 MED ORDER — ONDANSETRON HCL 4 MG/2ML IJ SOLN: 4.0000 mg | Freq: Four times a day (QID) | INTRAMUSCULAR | Status: DC | PRN

## 2016-04-16 MED ORDER — ASPIRIN EC 81 MG PO TBEC
81.0000 mg | DELAYED_RELEASE_TABLET | Freq: Every day | ORAL | Status: DC
  Administered 2016-04-17: 81 mg via ORAL
  Filled 2016-04-16: qty 1

## 2016-04-16 MED ORDER — BISACODYL 5 MG PO TBEC: 5.0000 mg | DELAYED_RELEASE_TABLET | Freq: Every day | ORAL | Status: DC | PRN

## 2016-04-16 MED ORDER — METOPROLOL SUCCINATE ER 25 MG PO TB24
25.0000 mg | ORAL_TABLET | Freq: Every day | ORAL | Status: DC
  Administered 2016-04-17: 25 mg via ORAL
  Filled 2016-04-16: qty 1

## 2016-04-16 MED ORDER — PANTOPRAZOLE SODIUM 40 MG PO TBEC
40.0000 mg | DELAYED_RELEASE_TABLET | Freq: Every day | ORAL | Status: DC
  Administered 2016-04-17: 40 mg via ORAL
  Filled 2016-04-16: qty 1

## 2016-04-16 MED ORDER — SIMVASTATIN 40 MG PO TABS: 40.0000 mg | ORAL_TABLET | Freq: Every day | ORAL | Status: DC

## 2016-04-16 MED ORDER — ACETAMINOPHEN 325 MG PO TABS: 650.0000 mg | ORAL_TABLET | Freq: Four times a day (QID) | ORAL | Status: DC | PRN

## 2016-04-16 MED ORDER — BENZONATATE 100 MG PO CAPS: 100.0000 mg | ORAL_CAPSULE | Freq: Three times a day (TID) | ORAL | Status: DC | PRN

## 2016-04-16 MED ORDER — ACETAMINOPHEN 650 MG RE SUPP: 650.0000 mg | Freq: Four times a day (QID) | RECTAL | Status: DC | PRN

## 2016-04-16 NOTE — ED Provider Notes (Signed)
[facility] Emergency Department Provider Note  ____________________________________________   First MD Initiated Contact with Patient 04/16/16 1754     (approximate)  I have reviewed the triage vital signs and the nursing notes.   HISTORY  Chief Complaint Fall and Leg Pain    HPI Vincent Jackson is a 83 y.o. male who comes to the emergency department via EMS after a fall with left-sided hip pain. He said he was walking at home and he thinks his feet may have hit together which caused the fall but he is not entirely sure. He is not sure whether or not he passed out. He currently denies chest pain or shortness of breath. He reports moderate to severe left hip sharp nonradiating aching discomfort when attempting to ambulate. Improved with rest. He had a pacemaker placed most recently in 2013.   Past Medical History:  Diagnosis Date  . CAD (coronary artery disease)   . Cancer Vincent Jackson)    Bladder cancer   . Diabetes mellitus without complication ([place])   . Diastolic heart failure ([place])   . Hyperlipidemia   . Prostate cancer [facility])     Patient Active Problem List   Diagnosis Date Noted  . Hip pain 04/16/2016  . Viral sepsis (Vincent Jackson) 03/12/2016  . Influenza 03/12/2016  . PVD (peripheral vascular disease) ([place]) 01/24/2016  . Decreased pedal pulses 12/16/2015  . History of bladder cancer 12/16/2015  . CAD (coronary artery disease) 09/09/2015  . Hyperlipidemia 09/09/2015  . Diabetes (Vincent Jackson) 09/09/2015    Past Surgical History:  Procedure Laterality Date  . BLADDER REMOVAL  2007  . MASTOIDECTOMY    . PROSTATE SURGERY  2007   removed   . TONSILLECTOMY      Prior to Admission medications   Medication Sig Start Date End Date Taking? Authorizing Provider  aspirin EC 81 MG tablet Take 81 mg by mouth daily.    Yes Historical Provider, MD  glipiZIDE (GLUCOTROL XL) 10 MG 24 hr tablet Take 10 mg by mouth 2 (two) times daily.  04/15/15  Yes Historical Provider, MD   latanoprost (XALATAN) 0.005 % ophthalmic solution PLACE ONE DROP INTO EACH EYE EVERY NIGHT 11/28/15  Yes Historical Provider, MD  metoprolol succinate (TOPROL-XL) 25 MG 24 hr tablet Take 25 mg by mouth daily.  07/05/15  Yes Historical Provider, MD  Multiple Vitamin (MULTIVITAMIN WITH MINERALS) TABS tablet Take 1 tablet by mouth daily.   Yes Historical Provider, MD  pantoprazole (PROTONIX) 40 MG tablet Take 40 mg by mouth daily.  05/24/15  Yes Historical Provider, MD  simvastatin (ZOCOR) 40 MG tablet Take 40 mg by mouth daily at 6 PM.  06/20/15  Yes Historical Provider, MD  acetaminophen (TYLENOL) 325 MG tablet Take 2 tablets (650 mg total) by mouth every 6 (six) hours as needed for mild pain (or Fever >/= 101). 03/15/16   Vincent Mango, MD  benzonatate (TESSALON) 100 MG capsule Take 1 capsule (100 mg total) by mouth 3 (three) times daily as needed for cough. 03/15/16   Vincent Mango, MD  docusate sodium (COLACE) 100 MG capsule Take 1 capsule (100 mg total) by mouth 2 (two) times daily. 03/15/16   Vincent Mango, MD  furosemide (LASIX) 20 MG tablet Take 1 tablet (20 mg total) by mouth daily. Patient not taking: Reported on 04/16/2016 03/16/16   Vincent Mango, MD  oseltamivir (TAMIFLU) 30 MG capsule Take 1 capsule (30 mg total) by mouth daily. Patient not taking: Reported on 04/16/2016 03/16/16  Vincent Mango, MD    Allergies Patient has no known allergies.  Family History  Problem Relation Age of Onset  . Cataracts    . Diabetes      Social History Social History  Substance Use Topics  . Smoking status: Former Smoker    Quit date: 09/08/1997  . Smokeless tobacco: Never Used  . Alcohol use 0.0 oz/week     Comment: Occasional     Review of Systems Constitutional: No fever/chills Eyes: No visual changes. ENT: No sore throat. Cardiovascular: Denies chest pain. Respiratory: Denies shortness of breath. Gastrointestinal: No abdominal pain.  No nausea, no vomiting.  No diarrhea.  No  constipation. Genitourinary: Negative for dysuria. Musculoskeletal: Positive for hip pain and difficulty ambulating. Skin: Negative for rash. Neurological: Negative for headaches, focal weakness or numbness.  10-point ROS otherwise negative.  ____________________________________________   PHYSICAL EXAM:  VITAL SIGNS: ED Triage Vitals [04/16/16 1734]  Enc Vitals Group     BP (!) 164/72     Pulse Rate 82     Resp 16     Temp 98 F (36.7 C)     Temp Source Oral     SpO2 94 %     Weight      Height      Head Circumference      Peak Flow      Pain Score 0     Pain Loc      Pain Edu?      Excl. in [place]?     Constitutional: Alert and oriented. Well appearing and in no acute distress. Eyes: Conjunctivae are normal. PERRL. EOMI. Head: Atraumatic. Nose: No congestion/rhinnorhea. Mouth/Throat: Mucous membranes are moist.  Oropharynx non-erythematous. Neck: No stridor.   Cardiovascular: Normal rate, regular rhythm. Grossly normal heart sounds.  Good peripheral circulation. Respiratory: Normal respiratory effort.  No retractions. Lungs CTAB. Gastrointestinal: Soft and nontender. No distention. No abdominal bruits. No CVA tenderness. Musculoskeletal: Unable to ambulate. Full range of motion of left hip with some discomfort with internal rotation Neurologic:  Normal speech and language. No gross focal neurologic deficits are appreciated. No gait instability. Skin:  Skin is warm, dry and intact. No rash noted. Psychiatric: Mood and affect are normal. Speech and behavior are normal.  ____________________________________________   LABS (all labs ordered are listed, but only abnormal results are displayed)  Labs Reviewed  COMPREHENSIVE METABOLIC PANEL - Abnormal; Notable for the following:       Result Value   Glucose, Bld 103 (*)    BUN 25 (*)    Creatinine, Ser 1.68 (*)    Calcium 8.7 (*)    GFR calc non Af Amer 33 (*)    GFR calc Af Amer 38 (*)    All other components  within normal limits  TROPONIN I - Abnormal; Notable for the following:    Troponin I 0.03 (*)    All other components within normal limits  CBC WITH DIFFERENTIAL/PLATELET - Abnormal; Notable for the following:    RBC 3.28 (*)    Hemoglobin 10.3 (*)    HCT 29.9 (*)    Platelets 146 (*)    All other components within normal limits  PROTIME-INR  BASIC METABOLIC PANEL  CBC  TROPONIN I  TROPONIN I   ____________________________________________  EKG  ED ECG REPORT I, Darel Hong, the attending physician, personally viewed and interpreted this ECG.  Date: 04/16/2016 Rate: 77 Rhythm: Ventricular paced rhythm QRS Axis: normal Intervals: normal ST/T Wave abnormalities: normal Conduction  Disturbances: none Narrative Interpretation: unremarkable. No sgarbossa criteria  ____________________________________________  RADIOLOGY  X-ray negative for acute fracture ____________________________________________   PROCEDURES  Procedure(s) performed: no  Procedures  Critical Care performed: no  ____________________________________________   INITIAL IMPRESSION / ASSESSMENT AND PLAN / ED COURSE  Pertinent labs & imaging results that were available during my care of the patient were reviewed by me and considered in my medical decision making (see chart for details).  On arrival the patient is well-appearing with a history concerning for syncope versus fall. Fortunately his x-rays negative for acute pathology but I then attempted to ambulate the patient and he was unable to take more than one step. MRI is the modality of choice. As the patient has a pacemaker we are unable to obtain the MRI at our facility several progress on to CT scan first. Regardless given his age, elevated troponin, and difficulty ambulating she will require inpatient admission.      ____________________________________________   FINAL CLINICAL IMPRESSION(S) / ED DIAGNOSES  Final diagnoses:  Fall   Pain of left hip joint      NEW MEDICATIONS STARTED DURING THIS VISIT:  New Prescriptions   No medications on file     Note:  This document was prepared using Dragon voice recognition software and may include unintentional dictation errors.     Darel Hong, MD 04/16/16 2042

## 2016-04-16 NOTE — ED Notes (Signed)
Pt has medical papers from [facility] stating pt is no code and has DNR. No DNR paperwork with pt, RN and secretary attempted x2 calls to facility with no answer. WIll try again for DNR to be faxed over

## 2016-04-16 NOTE — ED Notes (Signed)
Pt o2 sats 87-89% on RA, pt placed on 2L [place] , o2 sats 95-100%

## 2016-04-16 NOTE — ED Notes (Signed)
Sandwich tray give at this time

## 2016-04-16 NOTE — ED Notes (Signed)
Pt lives in indep facility , per wife unable to find DNR form at home but states he does have a DNR

## 2016-04-16 NOTE — H&P (Signed)
[facility] NAME: Vincent Jackson    MR#:  [id number]  DATE OF BIRTH:  January 04, 1920  DATE OF ADMISSION:  04/16/2016  PRIMARY CARE PHYSICIAN: Tommi Rumps, MD   REQUESTING/REFERRING PHYSICIAN: Dr. Harvest Dark  CHIEF COMPLAINT: Left hip pain    Chief Complaint  Patient presents with  . Fall  . Leg Pain    HISTORY OF PRESENT ILLNESS:  Vincent Jackson  is a 83 y.o. male with a known history of Essential hypertension, diabetes mellitus type 2 lives at independent facility had a fall this morning. Patient fell on his left side since then patient having left hip pain, difficulty with ambulation. Patient had a mechanical fall today. No loss of consciousness. No dizziness. No injury to head. Patient has no pain at rest  did not require any pain medicine in the emergency room so far. Refused  pain medicines. X-rays of the hip negative, scheduled to have CT of the hip for ruling out any occult fracture. PAST MEDICAL HISTORY:   Past Medical History:  Diagnosis Date  . CAD (coronary artery disease)   . Cancer [facility])    Bladder cancer   . Diabetes mellitus without complication ([place])   . Diastolic heart failure ([place])   . Hyperlipidemia   . Prostate cancer ([place])     PAST SURGICAL HISTOIRY:   Past Surgical History:  Procedure Laterality Date  . BLADDER REMOVAL  2007  . MASTOIDECTOMY    . PROSTATE SURGERY  2007   removed   . TONSILLECTOMY      SOCIAL HISTORY:   Social History  Substance Use Topics  . Smoking status: Former Smoker    Quit date: 09/08/1997  . Smokeless tobacco: Never Used  . Alcohol use 0.0 oz/week     Comment: Occasional     FAMILY HISTORY:   Family History  Problem Relation Age of Onset  . Cataracts    . Diabetes      DRUG ALLERGIES:  No Known Allergies  REVIEW OF SYSTEMS:  CONSTITUTIONAL: No fever, fatigue or weakness.  EYES: No blurred or double vision.  EARS, NOSE, AND THROAT: No  tinnitus or ear pain.  RESPIRATORY: No cough, shortness of breath, wheezing or hemoptysis.  CARDIOVASCULAR: No chest pain, orthopnea, edema.  GASTROINTESTINAL: No nausea, vomiting, diarrhea or abdominal pain.  GENITOURINARY: No dysuria, hematuria.  ENDOCRINE: No polyuria, nocturia,  HEMATOLOGY: No anemia, easy bruising or bleeding SKIN: No rash or lesion. MUSCULOSKELETAL: Left hip pain since this morning secondary to fall, landing on left hip. NEUROLOGIC: No tingling, numbness, weakness.  PSYCHIATRY: No anxiety or depression.   MEDICATIONS AT HOME:   Prior to Admission medications   Medication Sig Start Date End Date Taking? Authorizing Provider  aspirin EC 81 MG tablet Take 81 mg by mouth daily.    Yes Historical Provider, MD  glipiZIDE (GLUCOTROL XL) 10 MG 24 hr tablet Take 10 mg by mouth 2 (two) times daily.  04/15/15  Yes Historical Provider, MD  latanoprost (XALATAN) 0.005 % ophthalmic solution PLACE ONE DROP INTO EACH EYE EVERY NIGHT 11/28/15  Yes Historical Provider, MD  metoprolol succinate (TOPROL-XL) 25 MG 24 hr tablet Take 25 mg by mouth daily.  07/05/15  Yes Historical Provider, MD  Multiple Vitamin (MULTIVITAMIN WITH MINERALS) TABS tablet Take 1 tablet by mouth daily.   Yes Historical Provider, MD  pantoprazole (PROTONIX) 40 MG tablet Take 40 mg by mouth daily.  05/24/15  Yes Historical Provider,  MD  simvastatin (ZOCOR) 40 MG tablet Take 40 mg by mouth daily at 6 PM.  06/20/15  Yes Historical Provider, MD  acetaminophen (TYLENOL) 325 MG tablet Take 2 tablets (650 mg total) by mouth every 6 (six) hours as needed for mild pain (or Fever >/= 101). 03/15/16   [name], MD  benzonatate (TESSALON) 100 MG capsule Take 1 capsule (100 mg total) by mouth 3 (three) times daily as needed for cough. 03/15/16   [name], MD  docusate sodium (COLACE) 100 MG capsule Take 1 capsule (100 mg total) by mouth 2 (two) times daily. 03/15/16   [name], MD  furosemide (LASIX) 20 MG tablet Take 1  tablet (20 mg total) by mouth daily. Patient not taking: Reported on 04/16/2016 03/16/16   [name], MD  oseltamivir (TAMIFLU) 30 MG capsule Take 1 capsule (30 mg total) by mouth daily. Patient not taking: Reported on 04/16/2016 03/16/16   [name], MD      VITAL SIGNS:  Blood pressure (!) 164/72, pulse 82, temperature 98 F (36.7 C), temperature source Oral, resp. rate 16, SpO2 94 %.  PHYSICAL EXAMINATION:  GENERAL:  83 y.o.-year-old patient lying in the bed with no acute distress.  EYES: Pupils equal, round, reactive to light and accommodation. No scleral icterus. Extraocular muscles intact.  HEENT: Head atraumatic, normocephalic. Oropharynx and nasopharynx clear.  NECK:  Supple, no jugular venous distention. No thyroid enlargement, no tenderness.  LUNGS: Normal breath sounds bilaterally, no wheezing, rales,rhonchi or crepitation. No use of accessory muscles of respiration.  CARDIOVASCULAR: S1, S2 normal. No murmurs, rubs, or gallops.  ABDOMEN: Soft, nontender, nondistended. Bowel sounds present. No organomegaly or mass.  EXTREMITIES: No pedal edema, cyanosis, or clubbing.  NEUROLOGIC: Cranial nerves II through XII are intact. Muscle strength 5/5 in all extremities. Sensation intact. Gait not checked.  PSYCHIATRIC: The patient is alert and oriented x 3.  SKIN: No obvious rash, lesion, or ulcer.   LABORATORY PANEL:   CBC  Recent Labs Lab 04/16/16 1828  WBC 6.3  HGB 10.3*  HCT 29.9*  PLT 146*   ------------------------------------------------------------------------------------------------------------------  Chemistries   Recent Labs Lab 04/16/16 1828  NA 137  K 4.3  CL 106  CO2 26  GLUCOSE 103*  BUN 25*  CREATININE 1.68*  CALCIUM 8.7*  AST 30  ALT 17  ALKPHOS 49  BILITOT 0.6   ------------------------------------------------------------------------------------------------------------------  Cardiac Enzymes  Recent Labs Lab 04/16/16 1828  TROPONINI  0.03*   ------------------------------------------------------------------------------------------------------------------  RADIOLOGY:  Dg Hip Unilat With Pelvis 2-3 Views Left  Result Date: 04/16/2016 CLINICAL DATA:  Upper LEFT leg pain from groin to knee with movement EXAM: DG HIP (WITH OR WITHOUT PELVIS) 2-3V LEFT COMPARISON:  None FINDINGS: Diffuse osseous demineralization. Hip and SI joint spaces appear preserved. Prior pinning of proximal RIGHT femur with 3 cannulated screws identified. LEFT hip normally located without fracture or dislocation. Visualized pelvis intact. Numerous surgical clips throughout the pelvis bilaterally. Atherosclerotic calcifications at the iliac and femoral arteries. IMPRESSION: No acute osseous abnormalities. Electronically Signed   By: Lavonia Dana M.D.   On: 04/16/2016 18:18    EKG:   Orders placed or performed during the hospital encounter of 04/16/16  . EKG 12-Lead  . EKG 12-Lead   Patient has ventricular paced rhythm at 77 bpm. IMPRESSION AND PLAN:    83 year old male patient with essential hypertension, diabetes mellitus type 2 is at independent living facility comes in because of the fall, left hip pain due to fall. Patient  x-ray of the left hip negative. Having pain with ambulation. CT of the left hip ordered to evaluate for occult fracture because of his advanced age, hip pain with ambulation. The patient overnight for observation, pain control, physical therapy evaluation, follow the CAT scan of the left hip, if positive for fracture please consult orthopedic. Continue DVT prophylaxis.  #2 slightly elevated troponins of 0.03 without any chest pain. Monitor on telemetry, cycle the troponins. He  already on aspirin, metoprolol, statins: Continue them.  #3 diabetes mellitus type 2: Continue glipizide, monitor for hypoglycemia symptoms, continues to say with coverage.  #4 history of bladder cancer, bladder removed, patient to has a stoma in the right  lower quadrant for urine collection: No abdominal pain. the urine bag  To be emptied  every shift. # has chronic kidney disease stage 3;Stable, creatinine around 1.6.  5 history of coronary artery disease ;asymptomatic; Follows up with Dr. Omer Jack.. Patient has a pacemaker.  Code; DO NOT RESUSCITATE, confirmed with the patient. Register nurse witnessed it. Wife to get papers when she finds them.  All the records are reviewed and case discussed with ED provider. Management plans discussed with the patient, family and they are in agreement.  CODE STATUS:   DNR  TOTAL TIME TAKING CARE OF THIS PATIENT: 28minutes.    Epifanio Lesches M.D on 04/16/2016 at 7:50 PM  Between 7am to 6pm - Pager - [phone]  After 6pm go to [email or web address] - password [email or web address] [facility]  Office  [phone]  CC: Primary care physician; Tommi Rumps, MD  Note: This dictation was prepared with Dragon dictation along with smaller phrase technology. Any transcriptional errors that result from this process are unintentional.

## 2016-04-16 NOTE — ED Triage Notes (Addendum)
Pt to ED via EMS from [place] with c/o mechanical fall today. Pt denies any LOC with fall. Pt c/o upper left leg pain from groin to knee with movement, denies hitting hit head or any other injuires. Pt A&Ox4.

## 2016-04-17 DIAGNOSIS — J9611 Chronic respiratory failure with hypoxia: Secondary | ICD-10-CM | POA: Diagnosis not present

## 2016-04-17 DIAGNOSIS — I5032 Chronic diastolic (congestive) heart failure: Secondary | ICD-10-CM | POA: Diagnosis not present

## 2016-04-17 DIAGNOSIS — M25552 Pain in left hip: Secondary | ICD-10-CM | POA: Diagnosis not present

## 2016-04-17 DIAGNOSIS — N183 Chronic kidney disease, stage 3 (moderate): Secondary | ICD-10-CM | POA: Diagnosis not present

## 2016-04-17 LAB — CBC
HEMATOCRIT: 27.8 % — AB (ref 40.0–52.0)
HEMOGLOBIN: 9.9 g/dL — AB (ref 13.0–18.0)
MCH: 31.7 pg (ref 26.0–34.0)
MCHC: 35.4 g/dL (ref 32.0–36.0)
MCV: 89.5 fL (ref 80.0–100.0)
Platelets: 146 10*3/uL — ABNORMAL LOW (ref 150–440)
RBC: 3.11 MIL/uL — ABNORMAL LOW (ref 4.40–5.90)
RDW: 14.1 % (ref 11.5–14.5)
WBC: 5.1 10*3/uL (ref 3.8–10.6)

## 2016-04-17 LAB — BASIC METABOLIC PANEL
ANION GAP: 6 (ref 5–15)
BUN: 24 mg/dL — ABNORMAL HIGH (ref 6–20)
CALCIUM: 8.4 mg/dL — AB (ref 8.9–10.3)
CHLORIDE: 107 mmol/L (ref 101–111)
CO2: 25 mmol/L (ref 22–32)
Creatinine, Ser: 1.4 mg/dL — ABNORMAL HIGH (ref 0.61–1.24)
GFR calc Af Amer: 48 mL/min — ABNORMAL LOW (ref >60)
GFR calc non Af Amer: 41 mL/min — ABNORMAL LOW (ref >60)
GLUCOSE: 162 mg/dL — AB (ref 65–99)
POTASSIUM: 4 mmol/L (ref 3.5–5.1)
Sodium: 138 mmol/L (ref 135–145)

## 2016-04-17 LAB — GLUCOSE, CAPILLARY
Glucose-Capillary: 99 mg/dL (ref 65–99)
Glucose-Capillary: 157 mg/dL — ABNORMAL HIGH (ref 65–99)

## 2016-04-17 LAB — MRSA PCR SCREENING: MRSA by PCR: NEGATIVE

## 2016-04-17 LAB — TROPONIN I
Troponin I: 0.06 ng/mL (ref <0.03)
Troponin I: 0.05 ng/mL (ref <0.03)

## 2016-04-17 NOTE — Care Management (Signed)
Patient is a resident of the [facility] and was discharged from [facility] to the [facility] unit.  Unsure at present if patient presented for current stay from the skilled nursing unit or from his independent living level of care.  He was placed in observation after sustaining a fall resulting in hip pain.  PT consult is pending

## 2016-04-17 NOTE — Care Management (Signed)
Patient to discharge back home with home health physical therapy.  On last admission, he had qualified for home 02 but since he went to [facility] unit, and when transitioned back to his home, did not have home 02.  He has an underlying diagnosis of chronic diastolic heart failure.  Patient has no agency preference for home health or oxygen.  He says that he does have access to a walker and he does not have home oxygen.  Referral called to [facility] for the oxygen and home health.  [facility] [place]

## 2016-04-17 NOTE — Progress Notes (Signed)
Patient discharged home with home health,instructions explained and well understood,escorted by this nurse via wheelchair to private vehicle,vital signs within normal limits upon discharge

## 2016-04-17 NOTE — Progress Notes (Signed)
SATURATION QUALIFICATIONS: (This note is used to comply with regulatory documentation for home oxygen)  Patient Saturations on Room Air at Rest = 83%  Patient Saturations on 2 liters 02 at rest 93%

## 2016-04-17 NOTE — Clinical Social Work Note (Signed)
CSW received referral that patient is from [place].  Case discussed with case manager and plan is to discharge home with home health.  CSW to sign off please re-consult if social work needs arise.  Jones Broom. [place], MSW, [place]

## 2016-04-17 NOTE — Progress Notes (Signed)
Patient ID: Vincent Jackson, male   DOB: Mar 12, 1919, 83 y.o.   MRN: [id number] Acute respiratory failure with hypoxia requiring oxygen. Dx chronic diastolic heart fauilure.

## 2016-04-17 NOTE — Evaluation (Signed)
Physical Therapy Evaluation Patient Details Name: Vincent Jackson MRN: [id number] DOB: [birth year] Today's Date: 04/17/2016   History of Present Illness  Pt is a 83 y.o. male with a known history of Essential hypertension, diabetes mellitus type 2 lives at independent facility had a fall. Patient fell on his left side since then patient having left hip pain, difficulty with ambulation. No loss of consciousness. No dizziness. No injury to head. Patient has no pain at rest, did not require any pain medicine in the emergency room. Refused pain medicines. X-rays of the hip negative.    Clinical Impression  Pt presents with mild deficits in strength, transfers, mobility, gait, balance, and activity tolerance.  Pt SBA with bed mobility tasks and transfers with extra time and effort required.  Pt SBA with Amb 50' with RW with slow cadence but was able to ambulate with reciprocal gait pattern.  Pt reported LLE pain much better than yesterday with a max pain of 3-4/10 during gait and 0/10 at rest.  Pt will benefit from PT services to address above deficits for decreased caregiver assistance and fall risk and return to PLOF upon discharge.      Follow Up Recommendations Home health PT    Equipment Recommendations  Rolling walker with 5" wheels    Recommendations for Other Services       Precautions / Restrictions Precautions Precautions: Fall Restrictions Weight Bearing Restrictions: No      Mobility  Bed Mobility Overal bed mobility: Needs Assistance Bed Mobility: Supine to Sit;Sit to Supine     Supine to sit: Supervision Sit to supine: Supervision   General bed mobility comments: Pt SBA with bed mobility tasks using bed rail with extra time and effort required.  Transfers Overall transfer level: Needs assistance Equipment used: Rolling walker (2 wheeled) Transfers: Sit to/from Stand Sit to Stand: Supervision         General transfer comment: Pt steady with sit to/from stand  transfers with extra time/effort required  Ambulation/Gait Ambulation/Gait assistance: Supervision Ambulation Distance (Feet): 50 Feet Assistive device: Rolling walker (2 wheeled) Gait Pattern/deviations: Decreased step length - right;Decreased step length - left   Gait velocity interpretation: Below normal speed for age/gender General Gait Details: Pt steady with amb with RW without LOB with slow, cautious cadence with short reciprocal steps  Stairs            Wheelchair Mobility    Modified Rankin (Stroke Patients Only)       Balance Overall balance assessment: Needs assistance Sitting-balance support: No upper extremity supported Sitting balance-Leahy Scale: Good     Standing balance support: Bilateral upper extremity supported Standing balance-Leahy Scale: Good                               Pertinent Vitals/Pain Pain Assessment: No/denies pain    Home Living Family/patient expects to be discharged to:: Private residence Living Arrangements: Spouse/significant other   Type of Home: Apartment Home Access: Level entry     Home Layout: One level Home Equipment: Cane - single point;Walker - 4 wheels      Prior Function Level of Independence: Independent with assistive device(s)         Comments: Pt Mod I with Amb in home using SPC and with Rollator longer distances.     Hand Dominance        Extremity/Trunk Assessment        Lower Extremity Assessment Lower  Extremity Assessment: Generalized weakness       Communication   Communication: No difficulties  Cognition Arousal/Alertness: Awake/alert Behavior During Therapy: WFL for tasks assessed/performed Overall Cognitive Status: Within Functional Limits for tasks assessed                      General Comments      Exercises Total Joint Exercises Ankle Circles/Pumps: AROM;Both;10 reps Quad Sets: AROM;Both;10 reps Gluteal Sets: AROM;Both;10 reps Hip  ABduction/ADduction: AROM;Both;10 reps Straight Leg Raises: AROM;Both;10 reps Long Arc Quad: AROM;Both;10 reps Knee Flexion: AROM;Both;10 reps Marching in Standing: AROM;Both;5 reps   Assessment/Plan    PT Assessment Patient needs continued PT services  PT Problem List Decreased strength;Decreased activity tolerance;Decreased balance;Decreased mobility       PT Treatment Interventions DME instruction;Gait training;Functional mobility training;Neuromuscular re-education;Therapeutic exercise;Balance training;Therapeutic activities;Patient/family education    PT Goals (Current goals can be found in the Care Plan section)  Acute Rehab PT Goals Patient Stated Goal: To get back home to be with spouse PT Goal Formulation: With patient Time For Goal Achievement: 04/30/16 Potential to Achieve Goals: Good    Frequency Min 2X/week   Barriers to discharge        Co-evaluation               End of Session Equipment Utilized During Treatment: Gait belt;Oxygen Activity Tolerance: Patient tolerated treatment well Patient left: in bed;with bed alarm set;with call bell/phone within reach Nurse Communication: Other (comment) (Pt found in room on room air with SpO2 82-84%, nursing called and requested this PT to put pt back on 2LO2/min) PT Visit Diagnosis: Difficulty in walking, not elsewhere classified (R26.2);Muscle weakness (generalized) (M62.81)    Functional Assessment Tool Used: AM-PAC 6 Clicks Basic Mobility;Clinical judgement Functional Limitation: Mobility: Walking and moving around Mobility: Walking and Moving Around Current Status (Q8250): At least 20 percent but less than 40 percent impaired, limited or restricted Mobility: Walking and Moving Around Goal Status [phone]): At least 1 percent but less than 20 percent impaired, limited or restricted    Time: 1033-1110 PT Time Calculation (min) (ACUTE ONLY): 37 min   Charges:   PT Evaluation $PT Eval Low Complexity: 1  Procedure PT Treatments $Therapeutic Exercise: 8-22 mins   PT G Codes:   PT G-Codes **NOT FOR INPATIENT CLASS** Functional Assessment Tool Used: AM-PAC 6 Clicks Basic Mobility;Clinical judgement Functional Limitation: Mobility: Walking and moving around Mobility: Walking and Moving Around Current Status (U8891): At least 20 percent but less than 40 percent impaired, limited or restricted Mobility: Walking and Moving Around Goal Status [phone]): At least 1 percent but less than 20 percent impaired, limited or restricted    D. Scott Bocephus Cali PT, DPT 04/17/16, 11:55 AM

## 2016-04-17 NOTE — Plan of Care (Signed)
Problem: Activity: Goal: Risk for activity intolerance will decrease Outcome: Not Progressing Pt report no pain at this stime,pain to left leg with minimal movement.  Bedrest maintained at this time.  Physical therapy consult pending.

## 2016-04-17 NOTE — Progress Notes (Signed)
Chaplain had a meaningful conversation with patient. Patient said that he had a fall but there was no broken bones. Patient's wife is homebound and she can't visit her husband. Patient speaks slowly but is coherent in his conversations. Chaplain ask patient if there was anything he could do before leaving the room and patient said that he was fine.

## 2016-04-17 NOTE — Discharge Summary (Signed)
[name] NAME: Vincent Jackson    MR#:  [id number]  DATE OF BIRTH:  10-11-1919  DATE OF ADMISSION:  04/16/2016 ADMITTING PHYSICIAN: Epifanio Lesches, MD  DATE OF DISCHARGE: 04/17/2016  PRIMARY CARE PHYSICIAN: Tommi Rumps, MD    ADMISSION DIAGNOSIS:  Fall [W19.XXXA] Pain of left hip joint [M25.552]  DISCHARGE DIAGNOSIS:  Active Problems:   Hip pain   SECONDARY DIAGNOSIS:   Past Medical History:  Diagnosis Date  . CAD (coronary artery disease)   . Cancer [facility])    Bladder cancer   . Diabetes mellitus without complication ([place])   . Diastolic heart failure ([place])   . Hyperlipidemia   . Prostate cancer [facility])     HOSPITAL COURSE:   1. Left hip pain. Tylenol as needed for pain. X-rays and CT scan negative for fracture. Physical therapy recommended home with home health. 2. Chronic respiratory failure. Patient was supposed to be set up with oxygen upon discharge from the hospital last time. Not quite sure what happened but the patient states that he does not have oxygen at home. He shouldn't requalify for oxygen and oxygen will be set up 2 L nasal cannula 24/7. 3. Chronic diastolic congestive heart failure. No signs of chf at this time. Can restart lasix as outpatient. Already on metoprolol. 4.  Borderline elevated troponin with chronic kidney disease. This is a false positive. No complaints of chest pain or shortness of breath. 5. Chronic kidney disease stage III 6. Type 2 diabetes mellitus on glipizide  7. History of bladder cancer 8. History of prostate cancer 9. Glaucoma unspecified on latanoprost 10. Hyperlipidemia unspecified on Zocor 11. Anemia of chronic disease. Follow-up as outpatient   DISCHARGE CONDITIONS:   Satisfactory  CONSULTS OBTAINED:  Treatment Team:  Loletha Grayer, MD  DRUG ALLERGIES:  No Known Allergies  DISCHARGE MEDICATIONS:   Current Discharge Medication List    CONTINUE these  medications which have NOT CHANGED   Details  aspirin EC 81 MG tablet Take 81 mg by mouth daily.     glipiZIDE (GLUCOTROL XL) 10 MG 24 hr tablet Take 10 mg by mouth 2 (two) times daily.     latanoprost (XALATAN) 0.005 % ophthalmic solution PLACE ONE DROP INTO EACH EYE EVERY NIGHT    metoprolol succinate (TOPROL-XL) 25 MG 24 hr tablet Take 25 mg by mouth daily.     Multiple Vitamin (MULTIVITAMIN WITH MINERALS) TABS tablet Take 1 tablet by mouth daily.    pantoprazole (PROTONIX) 40 MG tablet Take 40 mg by mouth daily.     simvastatin (ZOCOR) 40 MG tablet Take 40 mg by mouth daily at 6 PM.     acetaminophen (TYLENOL) 325 MG tablet Take 2 tablets (650 mg total) by mouth every 6 (six) hours as needed for mild pain (or Fever >/= 101).    benzonatate (TESSALON) 100 MG capsule Take 1 capsule (100 mg total) by mouth 3 (three) times daily as needed for cough. Qty: 20 capsule, Refills: 0    docusate sodium (COLACE) 100 MG capsule Take 1 capsule (100 mg total) by mouth 2 (two) times daily. Qty: 10 capsule, Refills: 0    furosemide (LASIX) 20 MG tablet Take 1 tablet (20 mg total) by mouth daily. Qty: 30 tablet, Refills: 0      STOP taking these medications     oseltamivir (TAMIFLU) 30 MG capsule          DISCHARGE INSTRUCTIONS:   Follow-up with  PMD one week  If you experience worsening of your admission symptoms, develop shortness of breath, life threatening emergency, suicidal or homicidal thoughts you must seek medical attention immediately by calling 911 or calling your MD immediately  if symptoms less severe.  You Must read complete instructions/literature along with all the possible adverse reactions/side effects for all the Medicines you take and that have been prescribed to you. Take any new Medicines after you have completely understood and accept all the possible adverse reactions/side effects.   Please note  You were cared for by a hospitalist during your hospital stay. If  you have any questions about your discharge medications or the care you received while you were in the hospital after you are discharged, you can call the unit and asked to speak with the hospitalist on call if the hospitalist that took care of you is not available. Once you are discharged, your primary care physician will handle any further medical issues. Please note that NO REFILLS for any discharge medications will be authorized once you are discharged, as it is imperative that you return to your primary care physician (or establish a relationship with a primary care physician if you do not have one) for your aftercare needs so that they can reassess your need for medications and monitor your lab values.    Today   CHIEF COMPLAINT:   Chief Complaint  Patient presents with  . Fall  . Leg Pain    HISTORY OF PRESENT ILLNESS:  Collin Jackson  is a 83 y.o. male Presented after a fall and having leg pain   VITAL SIGNS:  Blood pressure (!) 140/55, pulse 77, temperature 98.7 F (37.1 C), temperature source Oral, resp. rate 16, height 6' (1.829 m), weight 72 kg (158 lb 12.8 oz), SpO2 92 %.   PHYSICAL EXAMINATION:  GENERAL:  83 y.o.-year-old patient lying in the bed with no acute distress.  EYES: Pupils equal, round, reactive to light and accommodation. No scleral icterus. Extraocular muscles intact.  HEENT: Head atraumatic, normocephalic. Oropharynx and nasopharynx clear.  NECK:  Supple, no jugular venous distention. No thyroid enlargement, no tenderness.  LUNGS: Normal breath sounds bilaterally, no wheezing, rales,rhonchi or crepitation. No use of accessory muscles of respiration.  CARDIOVASCULAR: S1, S2 normal. No murmurs, rubs, or gallops.  ABDOMEN: Soft, non-tender, non-distended. Bowel sounds present. No organomegaly or mass.  EXTREMITIES: No pedal edema, cyanosis, or clubbing.  NEUROLOGIC: Cranial nerves II through XII are intact. Muscle strength 5/5 in all extremities. Sensation  intact. Gait not checked.  PSYCHIATRIC: The patient is alert and oriented x 3.  SKIN: No obvious rash, lesion, or ulcer.   DATA REVIEW:   CBC  Recent Labs Lab 04/17/16 0106  WBC 5.1  HGB 9.9*  HCT 27.8*  PLT 146*    Chemistries   Recent Labs Lab 04/16/16 1828 04/17/16 0106  NA 137 138  K 4.3 4.0  CL 106 107  CO2 26 25  GLUCOSE 103* 162*  BUN 25* 24*  CREATININE 1.68* 1.40*  CALCIUM 8.7* 8.4*  AST 30  --   ALT 17  --   ALKPHOS 49  --   BILITOT 0.6  --     Cardiac Enzymes  Recent Labs Lab 04/17/16 0746  TROPONINI 0.05*    Microbiology Results  Results for orders placed or performed during the hospital encounter of 04/16/16  MRSA PCR Screening     Status: None   Collection Time: 04/16/16 11:39 PM  Result Value Ref  Range Status   MRSA by PCR NEGATIVE NEGATIVE Final    Comment:        The GeneXpert MRSA Assay (FDA approved for NASAL specimens only), is one component of a comprehensive MRSA colonization surveillance program. It is not intended to diagnose MRSA infection nor to guide or monitor treatment for MRSA infections.     RADIOLOGY:  Ct Pelvis Wo Contrast  Result Date: 04/16/2016 CLINICAL DATA:  83 year old male post fall with pain left upper leg from groin to knee with motion. Pacemaker in place. Initial encounter. EXAM: CT PELVIS WITHOUT CONTRAST TECHNIQUE: Multidetector CT imaging of the pelvis was performed following the standard protocol without intravenous contrast. COMPARISON:  11/15/2013 plain film exam.  09/14/2004 CT. FINDINGS: No left hip fracture or pelvic fracture noted. Remote right hip fracture treated with pains. Degenerative changes L4-5 and L5-S1. Prior pelvic surgery. Post cystectomy with drainage via a right uretero- ileostomy. Surgical clips in place. Vascular calcifications. Sigmoid diverticula. IMPRESSION: No left hip fracture or pelvic fracture noted. Remote right hip fracture treated with pains. Degenerative changes L4-5 and  L5-S1. Electronically Signed   By: Genia Del M.D.   On: 04/16/2016 19:48   Dg Hip Unilat With Pelvis 2-3 Views Left  Result Date: 04/16/2016 CLINICAL DATA:  Upper LEFT leg pain from groin to knee with movement EXAM: DG HIP (WITH OR WITHOUT PELVIS) 2-3V LEFT COMPARISON:  None FINDINGS: Diffuse osseous demineralization. Hip and SI joint spaces appear preserved. Prior pinning of proximal RIGHT femur with 3 cannulated screws identified. LEFT hip normally located without fracture or dislocation. Visualized pelvis intact. Numerous surgical clips throughout the pelvis bilaterally. Atherosclerotic calcifications at the iliac and femoral arteries. IMPRESSION: No acute osseous abnormalities. Electronically Signed   By: Lavonia Dana M.D.   On: 04/16/2016 18:18     Management plans discussed with the patient, family and they are in agreement.  CODE STATUS:     Code Status Orders        Start     Ordered   04/16/16 1950  Do not attempt resuscitation (DNR)  Continuous    Question Answer Comment  In the event of cardiac or respiratory ARREST Do not call a "code blue"   In the event of cardiac or respiratory ARREST Do not perform Intubation, CPR, defibrillation or ACLS   In the event of cardiac or respiratory ARREST Use medication by any route, position, wound care, and other measures to relive pain and suffering. May use oxygen, suction and manual treatment of airway obstruction as needed for comfort.      04/16/16 1949    Code Status History    Date Active Date Inactive Code Status Order ID Comments User Context   04/16/2016  7:12 PM 04/16/2016  7:49 PM Full Code [id number]  Epifanio Lesches, MD ED   03/12/2016 11:51 AM 03/15/2016  9:49 PM Full Code [id number]  Harrie Foreman, MD Inpatient    Advance Directive Documentation   Flowsheet Row Most Recent Value  Type of Advance Directive  Living will, Out of facility DNR (pink MOST or yellow form), Healthcare Power of Attorney  Pre-existing out of  facility DNR order (yellow form or pink MOST form)  -- [copy not avialable with patient]  "MOST" Form in Place?  No data      TOTAL TIME TAKING CARE OF THIS PATIENT: 35 minutes.    Loletha Grayer M.D on 04/17/2016 at 3:48 PM  Between 7am to 6pm - Pager - [phone]  After  6pm go to [email or web address] - password [email or web address] [facility]  [phone]  CC: Primary care physician; Tommi Rumps, MD

## 2016-04-19 ENCOUNTER — Telehealth: Payer: Self-pay | Admitting: Family Medicine

## 2016-04-19 NOTE — Telephone Encounter (Signed)
Transition Care Management Follow-up Telephone Call  How have you been since you were released from the hospital? I feel fine , but my left legs is very sore, from a fall. Patient stated he fell in wifs bedroom and required assist in rising sat down in recliner for a while drifted off to sleep upon awaking patient was in pain in left kleg and went to ED Where admitted for observation . Fracture R/O.   Do you understand why you were in the hospital? Yes, because I fell and my left leg hurt really bad.   Do you understand the discharge instrcutions? Yes,  Items Reviewed:  Medications reviewed:Yes, Pharmacy prepares medication. In packets for patient to take daily.  Allergies reviewed: Yes  Dietary changes reviewed:Yes  Referrals reviewed: Yes   Functional Questionnaire:   Activities of Daily Living (ADLs):   He states they are independent in the following:Can Dress himself , meals are prepared at [facility], patient can bathe with assist to bathroom. States they require assistance with the following:Patient requires the use of walker due to fall and left leg soreness.   Any transportation issues/concerns?: No   Any patient concerns?No   Confirmed importance and date/time of follow-up visits scheduled: Yes   Confirmed with patient if condition begins to worsen call PCP or go to the ER.  Patient was given the Call-a-Nurse line [phone]: Yes

## 2016-04-23 NOTE — Care Management (Signed)
Was  informed by [facility] that when attempt was made to deliver the home concentrator, patient's wife refused delivery.  She also refused home physical therapy.  Notified attending and informed Ronna Polio from the independent living resident clinic

## 2016-04-30 ENCOUNTER — Other Ambulatory Visit: Payer: Self-pay | Admitting: Family Medicine

## 2016-05-01 ENCOUNTER — Ambulatory Visit: Payer: Medicare Other | Admitting: Family Medicine

## 2016-05-15 ENCOUNTER — Other Ambulatory Visit: Payer: Self-pay | Admitting: Family Medicine

## 2016-05-16 NOTE — Telephone Encounter (Signed)
Patient is scheduled   

## 2016-05-16 NOTE — Telephone Encounter (Signed)
Refilled. Please ensure patient has an appointment scheduled for follow-up. Thanks.

## 2016-05-16 NOTE — Telephone Encounter (Signed)
Last OV 12/16/15 filed under historical provider

## 2016-05-18 ENCOUNTER — Encounter: Payer: Self-pay | Admitting: Family Medicine

## 2016-05-18 ENCOUNTER — Ambulatory Visit (INDEPENDENT_AMBULATORY_CARE_PROVIDER_SITE_OTHER): Payer: Medicare Other | Admitting: Family Medicine

## 2016-05-18 VITALS — BP 140/70 | HR 80 | Temp 98.5°F | Wt 164.0 lb

## 2016-05-18 DIAGNOSIS — H04552 Acquired stenosis of left nasolacrimal duct: Secondary | ICD-10-CM

## 2016-05-18 DIAGNOSIS — W19XXXD Unspecified fall, subsequent encounter: Secondary | ICD-10-CM | POA: Diagnosis not present

## 2016-05-18 DIAGNOSIS — E119 Type 2 diabetes mellitus without complications: Secondary | ICD-10-CM

## 2016-05-18 DIAGNOSIS — W19XXXA Unspecified fall, initial encounter: Secondary | ICD-10-CM

## 2016-05-18 LAB — POCT GLYCOSYLATED HEMOGLOBIN (HGB A1C): HEMOGLOBIN A1C: 6.9

## 2016-05-18 NOTE — Progress Notes (Signed)
Pre visit review using our clinic review tool, if applicable. No additional management support is needed unless otherwise documented below in the visit note. 

## 2016-05-18 NOTE — Patient Instructions (Addendum)
Nice to see you. We will refer you to an eye doctor. If you develop eye pain or vision changes seek medical attention. Please continue to do the exercises to help with strengthening your legs.

## 2016-05-18 NOTE — Progress Notes (Signed)
Tommi Rumps, MD Phone: [phone]  Vincent Jackson is a 83 y.o. male who presents today for follow-up.  Patient had a hospitalization in February from the 19th through the 20th for a fall and left hip pain. He has done well since then. They discharged him with physical therapy and he completed the course of that. He has continued to do the exercises at home. He did not take very much Tylenol pain. He notes no pain at this time. He notes no head injury or loss of consciousness with this fall. He notes just prior to that he had fallen and hit his right ribs. He notes no cough or fever or shortness of breath. He was supposed to be sent home on oxygen and he reports they brought that to him though he does not want to use it. His oxygen level is normal today. His discharge summary has been reviewed.  It was noted on exam that there was a redness and mild swelling to the area of the tear duct on his lower left eyelid medially. He notes no vision changes or eye pain. No eye redness. Reports he does follow with ophthalmologist at [facility] that does not have one locally.  Diabetes: Patient continues to take his glipizide. Not checking his sugars. No hypoglycemia. No polyuria.  PMH: Former smoker.   ROS see history of present illness  Objective  Physical Exam Vitals:   05/18/16 1602  BP: 140/70  Pulse: 80  Temp: 98.5 F (36.9 C)    BP Readings from Last 3 Encounters:  05/18/16 140/70  04/17/16 (!) 140/55  03/19/16 130/67   Wt Readings from Last 3 Encounters:  05/18/16 164 lb (74.4 kg)  04/17/16 158 lb 12.8 oz (72 kg)  03/15/16 164 lb 6.4 oz (74.6 kg)    Physical Exam  Constitutional: No distress.  HENT:  Head: Normocephalic and atraumatic.  Mouth/Throat: Oropharynx is clear and moist. No oropharyngeal exudate.  Eyes: Conjunctivae are normal. Pupils are equal, round, and reactive to light.    Cardiovascular: Normal rate, regular rhythm and normal heart sounds.     Pulmonary/Chest: Effort normal and breath sounds normal.  Musculoskeletal:  No tenderness of the left hip, full internal and external range of motion of the left hip, right ribs with no tenderness or bruising or bony defects  Neurological: He is alert.  CN 2-12 intact, 5/5 strength in bilateral biceps, triceps, grip, quads, hamstrings, plantar and dorsiflexion, sensation to light touch intact in bilateral UE and LE  Skin: Skin is warm and dry. He is not diaphoretic.     Assessment/Plan: Please see individual problem list.  Fall Patient with 2 falls in a fairly short period of time. No head injury or loss of consciousness. His hip discomfort is resolved. His rib discomfort is resolved at this time. Suspect related to generalized strength issues and gait instability. He has completed physical therapy and continues to do exercises. He had evaluation in the hospital for the second fall and had no fractures. Encouraged use of his cane and walker at all times. He'll continue to do the exercises. He'll monitor the areas of injury and if he develops pain he'll let us know.  Diabetes ([place]) Well-controlled by A1c. Continue current medications.  Obstruction of left tear duct Patient with apparent obstruction of left ear duct. No evidence of infection. We'll refer him to local ophthalmologist for follow-up. Given return precautions.   Orders Placed This Encounter  Procedures  . Ambulatory referral to Ophthalmology  Referral Priority:   Routine    Referral Type:   Consultation    Referral Reason:   Specialty Services Required    Requested Specialty:   Ophthalmology    Number of Visits Requested:   1  . POCT HgB A1C    Tommi Rumps, MD [place]

## 2016-05-19 DIAGNOSIS — W19XXXA Unspecified fall, initial encounter: Secondary | ICD-10-CM | POA: Insufficient documentation

## 2016-05-19 DIAGNOSIS — H04552 Acquired stenosis of left nasolacrimal duct: Secondary | ICD-10-CM | POA: Insufficient documentation

## 2016-05-19 NOTE — Assessment & Plan Note (Signed)
Patient with 2 falls in a fairly short period of time. No head injury or loss of consciousness. His hip discomfort is resolved. His rib discomfort is resolved at this time. Suspect related to generalized strength issues and gait instability. He has completed physical therapy and continues to do exercises. He had evaluation in the hospital for the second fall and had no fractures. Encouraged use of his cane and walker at all times. He'll continue to do the exercises. He'll monitor the areas of injury and if he develops pain he'll let us know.

## 2016-05-19 NOTE — Assessment & Plan Note (Signed)
Patient with apparent obstruction of left ear duct. No evidence of infection. We'll refer him to local ophthalmologist for follow-up. Given return precautions.

## 2016-05-19 NOTE — Assessment & Plan Note (Signed)
Well-controlled by A1c. Continue current medications.

## 2016-05-21 ENCOUNTER — Encounter: Payer: Self-pay | Admitting: Family Medicine

## 2016-05-23 ENCOUNTER — Other Ambulatory Visit: Payer: Self-pay | Admitting: Family Medicine

## 2016-06-28 ENCOUNTER — Other Ambulatory Visit: Payer: Self-pay | Admitting: Family Medicine

## 2016-07-24 ENCOUNTER — Encounter (INDEPENDENT_AMBULATORY_CARE_PROVIDER_SITE_OTHER): Payer: Medicare Other

## 2016-07-24 ENCOUNTER — Ambulatory Visit (INDEPENDENT_AMBULATORY_CARE_PROVIDER_SITE_OTHER): Payer: Medicare Other | Admitting: Vascular Surgery

## 2016-08-02 DIAGNOSIS — J449 Chronic obstructive pulmonary disease, unspecified: Secondary | ICD-10-CM | POA: Diagnosis not present

## 2016-08-02 DIAGNOSIS — I10 Essential (primary) hypertension: Secondary | ICD-10-CM | POA: Diagnosis not present

## 2016-08-02 DIAGNOSIS — Z9889 Other specified postprocedural states: Secondary | ICD-10-CM | POA: Diagnosis not present

## 2016-08-02 DIAGNOSIS — I5032 Chronic diastolic (congestive) heart failure: Secondary | ICD-10-CM | POA: Diagnosis not present

## 2016-08-02 DIAGNOSIS — Z951 Presence of aortocoronary bypass graft: Secondary | ICD-10-CM | POA: Diagnosis not present

## 2016-08-02 DIAGNOSIS — I350 Nonrheumatic aortic (valve) stenosis: Secondary | ICD-10-CM | POA: Diagnosis not present

## 2016-08-03 DIAGNOSIS — I739 Peripheral vascular disease, unspecified: Secondary | ICD-10-CM | POA: Diagnosis not present

## 2016-08-03 DIAGNOSIS — D500 Iron deficiency anemia secondary to blood loss (chronic): Secondary | ICD-10-CM | POA: Diagnosis not present

## 2016-08-03 DIAGNOSIS — E538 Deficiency of other specified B group vitamins: Secondary | ICD-10-CM | POA: Diagnosis not present

## 2016-08-03 DIAGNOSIS — E118 Type 2 diabetes mellitus with unspecified complications: Secondary | ICD-10-CM | POA: Diagnosis not present

## 2016-08-03 DIAGNOSIS — E1121 Type 2 diabetes mellitus with diabetic nephropathy: Secondary | ICD-10-CM | POA: Diagnosis not present

## 2016-08-03 DIAGNOSIS — Z0000 Encounter for general adult medical examination without abnormal findings: Secondary | ICD-10-CM | POA: Diagnosis not present

## 2016-08-03 DIAGNOSIS — N183 Chronic kidney disease, stage 3 (moderate): Secondary | ICD-10-CM | POA: Diagnosis not present

## 2016-08-21 ENCOUNTER — Other Ambulatory Visit: Payer: Self-pay | Admitting: Family Medicine

## 2016-08-24 ENCOUNTER — Other Ambulatory Visit: Payer: Self-pay | Admitting: Orthopedic Surgery

## 2016-08-24 DIAGNOSIS — M84351A Stress fracture, right femur, initial encounter for fracture: Secondary | ICD-10-CM | POA: Diagnosis not present

## 2016-08-24 DIAGNOSIS — M25551 Pain in right hip: Secondary | ICD-10-CM | POA: Diagnosis not present

## 2016-08-28 ENCOUNTER — Ambulatory Visit
Admission: RE | Admit: 2016-08-28 | Discharge: 2016-08-28 | Disposition: A | Discharge status: Home / Self Care | Payer: Medicare HMO | Source: Ambulatory Visit | Attending: Orthopedic Surgery | Admitting: Orthopedic Surgery

## 2016-08-28 ENCOUNTER — Encounter
Admission: RE | Admit: 2016-08-28 | Discharge: 2016-08-28 | Disposition: A | Discharge status: Home / Self Care | Payer: Medicare HMO | Source: Ambulatory Visit | Attending: Orthopedic Surgery | Admitting: Orthopedic Surgery

## 2016-08-28 DIAGNOSIS — M84351A Stress fracture, right femur, initial encounter for fracture: Secondary | ICD-10-CM

## 2016-08-28 DIAGNOSIS — M25551 Pain in right hip: Secondary | ICD-10-CM | POA: Diagnosis not present

## 2016-08-28 DIAGNOSIS — S79911A Unspecified injury of right hip, initial encounter: Secondary | ICD-10-CM | POA: Diagnosis not present

## 2016-08-28 MED ORDER — TECHNETIUM TC 99M MEDRONATE IV KIT
25.0000 | PACK | Freq: Once | INTRAVENOUS | Status: AC | PRN
  Administered 2016-08-28: 24.43 via INTRAVENOUS

## 2016-10-05 DIAGNOSIS — S32592D Other specified fracture of left pubis, subsequent encounter for fracture with routine healing: Secondary | ICD-10-CM | POA: Diagnosis not present

## 2016-10-05 DIAGNOSIS — S32591D Other specified fracture of right pubis, subsequent encounter for fracture with routine healing: Secondary | ICD-10-CM | POA: Diagnosis not present

## 2016-10-09 DIAGNOSIS — I129 Hypertensive chronic kidney disease with stage 1 through stage 4 chronic kidney disease, or unspecified chronic kidney disease: Secondary | ICD-10-CM | POA: Diagnosis not present

## 2016-10-09 DIAGNOSIS — S32592D Other specified fracture of left pubis, subsequent encounter for fracture with routine healing: Secondary | ICD-10-CM | POA: Diagnosis not present

## 2016-10-09 DIAGNOSIS — S7291XD Unspecified fracture of right femur, subsequent encounter for closed fracture with routine healing: Secondary | ICD-10-CM | POA: Diagnosis not present

## 2016-10-09 DIAGNOSIS — J449 Chronic obstructive pulmonary disease, unspecified: Secondary | ICD-10-CM | POA: Diagnosis not present

## 2016-10-09 DIAGNOSIS — E119 Type 2 diabetes mellitus without complications: Secondary | ICD-10-CM | POA: Diagnosis not present

## 2016-10-09 DIAGNOSIS — S32591D Other specified fracture of right pubis, subsequent encounter for fracture with routine healing: Secondary | ICD-10-CM | POA: Diagnosis not present

## 2016-10-12 DIAGNOSIS — J449 Chronic obstructive pulmonary disease, unspecified: Secondary | ICD-10-CM | POA: Diagnosis not present

## 2016-10-12 DIAGNOSIS — S7291XD Unspecified fracture of right femur, subsequent encounter for closed fracture with routine healing: Secondary | ICD-10-CM | POA: Diagnosis not present

## 2016-10-12 DIAGNOSIS — S32592D Other specified fracture of left pubis, subsequent encounter for fracture with routine healing: Secondary | ICD-10-CM | POA: Diagnosis not present

## 2016-10-12 DIAGNOSIS — E119 Type 2 diabetes mellitus without complications: Secondary | ICD-10-CM | POA: Diagnosis not present

## 2016-10-12 DIAGNOSIS — I129 Hypertensive chronic kidney disease with stage 1 through stage 4 chronic kidney disease, or unspecified chronic kidney disease: Secondary | ICD-10-CM | POA: Diagnosis not present

## 2016-10-12 DIAGNOSIS — S32591D Other specified fracture of right pubis, subsequent encounter for fracture with routine healing: Secondary | ICD-10-CM | POA: Diagnosis not present

## 2016-10-17 DIAGNOSIS — S32592D Other specified fracture of left pubis, subsequent encounter for fracture with routine healing: Secondary | ICD-10-CM | POA: Diagnosis not present

## 2016-10-17 DIAGNOSIS — S7291XD Unspecified fracture of right femur, subsequent encounter for closed fracture with routine healing: Secondary | ICD-10-CM | POA: Diagnosis not present

## 2016-10-17 DIAGNOSIS — I129 Hypertensive chronic kidney disease with stage 1 through stage 4 chronic kidney disease, or unspecified chronic kidney disease: Secondary | ICD-10-CM | POA: Diagnosis not present

## 2016-10-17 DIAGNOSIS — E119 Type 2 diabetes mellitus without complications: Secondary | ICD-10-CM | POA: Diagnosis not present

## 2016-10-17 DIAGNOSIS — S32591D Other specified fracture of right pubis, subsequent encounter for fracture with routine healing: Secondary | ICD-10-CM | POA: Diagnosis not present

## 2016-10-17 DIAGNOSIS — J449 Chronic obstructive pulmonary disease, unspecified: Secondary | ICD-10-CM | POA: Diagnosis not present

## 2016-10-19 DIAGNOSIS — I129 Hypertensive chronic kidney disease with stage 1 through stage 4 chronic kidney disease, or unspecified chronic kidney disease: Secondary | ICD-10-CM | POA: Diagnosis not present

## 2016-10-19 DIAGNOSIS — E119 Type 2 diabetes mellitus without complications: Secondary | ICD-10-CM | POA: Diagnosis not present

## 2016-10-19 DIAGNOSIS — S32592D Other specified fracture of left pubis, subsequent encounter for fracture with routine healing: Secondary | ICD-10-CM | POA: Diagnosis not present

## 2016-10-19 DIAGNOSIS — S32591D Other specified fracture of right pubis, subsequent encounter for fracture with routine healing: Secondary | ICD-10-CM | POA: Diagnosis not present

## 2016-10-19 DIAGNOSIS — J449 Chronic obstructive pulmonary disease, unspecified: Secondary | ICD-10-CM | POA: Diagnosis not present

## 2016-10-19 DIAGNOSIS — S7291XD Unspecified fracture of right femur, subsequent encounter for closed fracture with routine healing: Secondary | ICD-10-CM | POA: Diagnosis not present

## 2016-10-23 DIAGNOSIS — S32591D Other specified fracture of right pubis, subsequent encounter for fracture with routine healing: Secondary | ICD-10-CM | POA: Diagnosis not present

## 2016-10-23 DIAGNOSIS — I129 Hypertensive chronic kidney disease with stage 1 through stage 4 chronic kidney disease, or unspecified chronic kidney disease: Secondary | ICD-10-CM | POA: Diagnosis not present

## 2016-10-23 DIAGNOSIS — J449 Chronic obstructive pulmonary disease, unspecified: Secondary | ICD-10-CM | POA: Diagnosis not present

## 2016-10-23 DIAGNOSIS — S7291XD Unspecified fracture of right femur, subsequent encounter for closed fracture with routine healing: Secondary | ICD-10-CM | POA: Diagnosis not present

## 2016-10-23 DIAGNOSIS — E119 Type 2 diabetes mellitus without complications: Secondary | ICD-10-CM | POA: Diagnosis not present

## 2016-10-23 DIAGNOSIS — S32592D Other specified fracture of left pubis, subsequent encounter for fracture with routine healing: Secondary | ICD-10-CM | POA: Diagnosis not present

## 2016-10-25 DIAGNOSIS — E119 Type 2 diabetes mellitus without complications: Secondary | ICD-10-CM | POA: Diagnosis not present

## 2016-10-25 DIAGNOSIS — I350 Nonrheumatic aortic (valve) stenosis: Secondary | ICD-10-CM | POA: Diagnosis not present

## 2016-10-25 DIAGNOSIS — I10 Essential (primary) hypertension: Secondary | ICD-10-CM | POA: Diagnosis not present

## 2016-10-25 DIAGNOSIS — J449 Chronic obstructive pulmonary disease, unspecified: Secondary | ICD-10-CM | POA: Diagnosis not present

## 2016-10-25 DIAGNOSIS — S7291XD Unspecified fracture of right femur, subsequent encounter for closed fracture with routine healing: Secondary | ICD-10-CM | POA: Diagnosis not present

## 2016-10-25 DIAGNOSIS — S32592D Other specified fracture of left pubis, subsequent encounter for fracture with routine healing: Secondary | ICD-10-CM | POA: Diagnosis not present

## 2016-10-25 DIAGNOSIS — I5032 Chronic diastolic (congestive) heart failure: Secondary | ICD-10-CM | POA: Diagnosis not present

## 2016-10-25 DIAGNOSIS — S32591D Other specified fracture of right pubis, subsequent encounter for fracture with routine healing: Secondary | ICD-10-CM | POA: Diagnosis not present

## 2016-10-25 DIAGNOSIS — I129 Hypertensive chronic kidney disease with stage 1 through stage 4 chronic kidney disease, or unspecified chronic kidney disease: Secondary | ICD-10-CM | POA: Diagnosis not present

## 2016-10-30 DIAGNOSIS — J449 Chronic obstructive pulmonary disease, unspecified: Secondary | ICD-10-CM | POA: Diagnosis not present

## 2016-10-30 DIAGNOSIS — E782 Mixed hyperlipidemia: Secondary | ICD-10-CM | POA: Diagnosis not present

## 2016-10-30 DIAGNOSIS — S7291XD Unspecified fracture of right femur, subsequent encounter for closed fracture with routine healing: Secondary | ICD-10-CM | POA: Diagnosis not present

## 2016-10-30 DIAGNOSIS — Z9889 Other specified postprocedural states: Secondary | ICD-10-CM | POA: Diagnosis not present

## 2016-10-30 DIAGNOSIS — E119 Type 2 diabetes mellitus without complications: Secondary | ICD-10-CM | POA: Diagnosis not present

## 2016-10-30 DIAGNOSIS — I10 Essential (primary) hypertension: Secondary | ICD-10-CM | POA: Diagnosis not present

## 2016-10-30 DIAGNOSIS — I129 Hypertensive chronic kidney disease with stage 1 through stage 4 chronic kidney disease, or unspecified chronic kidney disease: Secondary | ICD-10-CM | POA: Diagnosis not present

## 2016-10-30 DIAGNOSIS — I350 Nonrheumatic aortic (valve) stenosis: Secondary | ICD-10-CM | POA: Diagnosis not present

## 2016-10-30 DIAGNOSIS — S32592D Other specified fracture of left pubis, subsequent encounter for fracture with routine healing: Secondary | ICD-10-CM | POA: Diagnosis not present

## 2016-10-30 DIAGNOSIS — S32591D Other specified fracture of right pubis, subsequent encounter for fracture with routine healing: Secondary | ICD-10-CM | POA: Diagnosis not present

## 2016-10-30 DIAGNOSIS — Z951 Presence of aortocoronary bypass graft: Secondary | ICD-10-CM | POA: Diagnosis not present

## 2016-11-01 DIAGNOSIS — S32592D Other specified fracture of left pubis, subsequent encounter for fracture with routine healing: Secondary | ICD-10-CM | POA: Diagnosis not present

## 2016-11-01 DIAGNOSIS — S7291XD Unspecified fracture of right femur, subsequent encounter for closed fracture with routine healing: Secondary | ICD-10-CM | POA: Diagnosis not present

## 2016-11-01 DIAGNOSIS — I129 Hypertensive chronic kidney disease with stage 1 through stage 4 chronic kidney disease, or unspecified chronic kidney disease: Secondary | ICD-10-CM | POA: Diagnosis not present

## 2016-11-01 DIAGNOSIS — S32591D Other specified fracture of right pubis, subsequent encounter for fracture with routine healing: Secondary | ICD-10-CM | POA: Diagnosis not present

## 2016-11-01 DIAGNOSIS — E119 Type 2 diabetes mellitus without complications: Secondary | ICD-10-CM | POA: Diagnosis not present

## 2016-11-01 DIAGNOSIS — J449 Chronic obstructive pulmonary disease, unspecified: Secondary | ICD-10-CM | POA: Diagnosis not present

## 2016-11-05 DIAGNOSIS — J449 Chronic obstructive pulmonary disease, unspecified: Secondary | ICD-10-CM | POA: Diagnosis not present

## 2016-11-05 DIAGNOSIS — I129 Hypertensive chronic kidney disease with stage 1 through stage 4 chronic kidney disease, or unspecified chronic kidney disease: Secondary | ICD-10-CM | POA: Diagnosis not present

## 2016-11-05 DIAGNOSIS — S32592D Other specified fracture of left pubis, subsequent encounter for fracture with routine healing: Secondary | ICD-10-CM | POA: Diagnosis not present

## 2016-11-05 DIAGNOSIS — S7291XD Unspecified fracture of right femur, subsequent encounter for closed fracture with routine healing: Secondary | ICD-10-CM | POA: Diagnosis not present

## 2016-11-05 DIAGNOSIS — E119 Type 2 diabetes mellitus without complications: Secondary | ICD-10-CM | POA: Diagnosis not present

## 2016-11-05 DIAGNOSIS — S32591D Other specified fracture of right pubis, subsequent encounter for fracture with routine healing: Secondary | ICD-10-CM | POA: Diagnosis not present

## 2016-11-08 DIAGNOSIS — S32591D Other specified fracture of right pubis, subsequent encounter for fracture with routine healing: Secondary | ICD-10-CM | POA: Diagnosis not present

## 2016-11-08 DIAGNOSIS — I129 Hypertensive chronic kidney disease with stage 1 through stage 4 chronic kidney disease, or unspecified chronic kidney disease: Secondary | ICD-10-CM | POA: Diagnosis not present

## 2016-11-08 DIAGNOSIS — J449 Chronic obstructive pulmonary disease, unspecified: Secondary | ICD-10-CM | POA: Diagnosis not present

## 2016-11-08 DIAGNOSIS — E119 Type 2 diabetes mellitus without complications: Secondary | ICD-10-CM | POA: Diagnosis not present

## 2016-11-08 DIAGNOSIS — S7291XD Unspecified fracture of right femur, subsequent encounter for closed fracture with routine healing: Secondary | ICD-10-CM | POA: Diagnosis not present

## 2016-11-08 DIAGNOSIS — S32592D Other specified fracture of left pubis, subsequent encounter for fracture with routine healing: Secondary | ICD-10-CM | POA: Diagnosis not present

## 2016-11-12 DIAGNOSIS — S7291XD Unspecified fracture of right femur, subsequent encounter for closed fracture with routine healing: Secondary | ICD-10-CM | POA: Diagnosis not present

## 2016-11-12 DIAGNOSIS — E119 Type 2 diabetes mellitus without complications: Secondary | ICD-10-CM | POA: Diagnosis not present

## 2016-11-12 DIAGNOSIS — I129 Hypertensive chronic kidney disease with stage 1 through stage 4 chronic kidney disease, or unspecified chronic kidney disease: Secondary | ICD-10-CM | POA: Diagnosis not present

## 2016-11-12 DIAGNOSIS — S32591D Other specified fracture of right pubis, subsequent encounter for fracture with routine healing: Secondary | ICD-10-CM | POA: Diagnosis not present

## 2016-11-12 DIAGNOSIS — S32592D Other specified fracture of left pubis, subsequent encounter for fracture with routine healing: Secondary | ICD-10-CM | POA: Diagnosis not present

## 2016-11-12 DIAGNOSIS — J449 Chronic obstructive pulmonary disease, unspecified: Secondary | ICD-10-CM | POA: Diagnosis not present

## 2016-11-14 DIAGNOSIS — I129 Hypertensive chronic kidney disease with stage 1 through stage 4 chronic kidney disease, or unspecified chronic kidney disease: Secondary | ICD-10-CM | POA: Diagnosis not present

## 2016-11-14 DIAGNOSIS — J449 Chronic obstructive pulmonary disease, unspecified: Secondary | ICD-10-CM | POA: Diagnosis not present

## 2016-11-14 DIAGNOSIS — E119 Type 2 diabetes mellitus without complications: Secondary | ICD-10-CM | POA: Diagnosis not present

## 2016-11-14 DIAGNOSIS — S32592D Other specified fracture of left pubis, subsequent encounter for fracture with routine healing: Secondary | ICD-10-CM | POA: Diagnosis not present

## 2016-11-14 DIAGNOSIS — S7291XD Unspecified fracture of right femur, subsequent encounter for closed fracture with routine healing: Secondary | ICD-10-CM | POA: Diagnosis not present

## 2016-11-14 DIAGNOSIS — S32591D Other specified fracture of right pubis, subsequent encounter for fracture with routine healing: Secondary | ICD-10-CM | POA: Diagnosis not present

## 2016-11-19 ENCOUNTER — Other Ambulatory Visit: Payer: Self-pay | Admitting: Family Medicine

## 2016-11-19 ENCOUNTER — Ambulatory Visit: Payer: Medicare Other | Admitting: Family Medicine

## 2016-11-19 DIAGNOSIS — I129 Hypertensive chronic kidney disease with stage 1 through stage 4 chronic kidney disease, or unspecified chronic kidney disease: Secondary | ICD-10-CM | POA: Diagnosis not present

## 2016-11-19 DIAGNOSIS — S7291XD Unspecified fracture of right femur, subsequent encounter for closed fracture with routine healing: Secondary | ICD-10-CM | POA: Diagnosis not present

## 2016-11-19 DIAGNOSIS — E119 Type 2 diabetes mellitus without complications: Secondary | ICD-10-CM | POA: Diagnosis not present

## 2016-11-19 DIAGNOSIS — J449 Chronic obstructive pulmonary disease, unspecified: Secondary | ICD-10-CM | POA: Diagnosis not present

## 2016-11-19 DIAGNOSIS — S32591D Other specified fracture of right pubis, subsequent encounter for fracture with routine healing: Secondary | ICD-10-CM | POA: Diagnosis not present

## 2016-11-19 DIAGNOSIS — S32592D Other specified fracture of left pubis, subsequent encounter for fracture with routine healing: Secondary | ICD-10-CM | POA: Diagnosis not present

## 2016-11-21 DIAGNOSIS — S32592D Other specified fracture of left pubis, subsequent encounter for fracture with routine healing: Secondary | ICD-10-CM | POA: Diagnosis not present

## 2016-11-21 DIAGNOSIS — J449 Chronic obstructive pulmonary disease, unspecified: Secondary | ICD-10-CM | POA: Diagnosis not present

## 2016-11-21 DIAGNOSIS — E119 Type 2 diabetes mellitus without complications: Secondary | ICD-10-CM | POA: Diagnosis not present

## 2016-11-21 DIAGNOSIS — S32591D Other specified fracture of right pubis, subsequent encounter for fracture with routine healing: Secondary | ICD-10-CM | POA: Diagnosis not present

## 2016-11-21 DIAGNOSIS — I129 Hypertensive chronic kidney disease with stage 1 through stage 4 chronic kidney disease, or unspecified chronic kidney disease: Secondary | ICD-10-CM | POA: Diagnosis not present

## 2016-11-21 DIAGNOSIS — S7291XD Unspecified fracture of right femur, subsequent encounter for closed fracture with routine healing: Secondary | ICD-10-CM | POA: Diagnosis not present

## 2016-11-27 DIAGNOSIS — J449 Chronic obstructive pulmonary disease, unspecified: Secondary | ICD-10-CM | POA: Diagnosis not present

## 2016-11-27 DIAGNOSIS — E119 Type 2 diabetes mellitus without complications: Secondary | ICD-10-CM | POA: Diagnosis not present

## 2016-11-27 DIAGNOSIS — S32592D Other specified fracture of left pubis, subsequent encounter for fracture with routine healing: Secondary | ICD-10-CM | POA: Diagnosis not present

## 2016-11-27 DIAGNOSIS — S32591D Other specified fracture of right pubis, subsequent encounter for fracture with routine healing: Secondary | ICD-10-CM | POA: Diagnosis not present

## 2016-11-27 DIAGNOSIS — I129 Hypertensive chronic kidney disease with stage 1 through stage 4 chronic kidney disease, or unspecified chronic kidney disease: Secondary | ICD-10-CM | POA: Diagnosis not present

## 2016-11-27 DIAGNOSIS — S7291XD Unspecified fracture of right femur, subsequent encounter for closed fracture with routine healing: Secondary | ICD-10-CM | POA: Diagnosis not present

## 2016-11-29 DIAGNOSIS — S32591D Other specified fracture of right pubis, subsequent encounter for fracture with routine healing: Secondary | ICD-10-CM | POA: Diagnosis not present

## 2016-11-29 DIAGNOSIS — S32592D Other specified fracture of left pubis, subsequent encounter for fracture with routine healing: Secondary | ICD-10-CM | POA: Diagnosis not present

## 2016-11-29 DIAGNOSIS — E119 Type 2 diabetes mellitus without complications: Secondary | ICD-10-CM | POA: Diagnosis not present

## 2016-11-29 DIAGNOSIS — S7291XD Unspecified fracture of right femur, subsequent encounter for closed fracture with routine healing: Secondary | ICD-10-CM | POA: Diagnosis not present

## 2016-11-29 DIAGNOSIS — J449 Chronic obstructive pulmonary disease, unspecified: Secondary | ICD-10-CM | POA: Diagnosis not present

## 2016-11-29 DIAGNOSIS — I129 Hypertensive chronic kidney disease with stage 1 through stage 4 chronic kidney disease, or unspecified chronic kidney disease: Secondary | ICD-10-CM | POA: Diagnosis not present

## 2016-11-30 DIAGNOSIS — M7061 Trochanteric bursitis, right hip: Secondary | ICD-10-CM | POA: Diagnosis not present

## 2016-12-03 DIAGNOSIS — S7291XD Unspecified fracture of right femur, subsequent encounter for closed fracture with routine healing: Secondary | ICD-10-CM | POA: Diagnosis not present

## 2016-12-03 DIAGNOSIS — S32592D Other specified fracture of left pubis, subsequent encounter for fracture with routine healing: Secondary | ICD-10-CM | POA: Diagnosis not present

## 2016-12-03 DIAGNOSIS — E119 Type 2 diabetes mellitus without complications: Secondary | ICD-10-CM | POA: Diagnosis not present

## 2016-12-03 DIAGNOSIS — J449 Chronic obstructive pulmonary disease, unspecified: Secondary | ICD-10-CM | POA: Diagnosis not present

## 2016-12-03 DIAGNOSIS — I129 Hypertensive chronic kidney disease with stage 1 through stage 4 chronic kidney disease, or unspecified chronic kidney disease: Secondary | ICD-10-CM | POA: Diagnosis not present

## 2016-12-03 DIAGNOSIS — S32591D Other specified fracture of right pubis, subsequent encounter for fracture with routine healing: Secondary | ICD-10-CM | POA: Diagnosis not present

## 2016-12-07 DIAGNOSIS — S32592D Other specified fracture of left pubis, subsequent encounter for fracture with routine healing: Secondary | ICD-10-CM | POA: Diagnosis not present

## 2016-12-07 DIAGNOSIS — J449 Chronic obstructive pulmonary disease, unspecified: Secondary | ICD-10-CM | POA: Diagnosis not present

## 2016-12-07 DIAGNOSIS — E119 Type 2 diabetes mellitus without complications: Secondary | ICD-10-CM | POA: Diagnosis not present

## 2016-12-07 DIAGNOSIS — S7291XD Unspecified fracture of right femur, subsequent encounter for closed fracture with routine healing: Secondary | ICD-10-CM | POA: Diagnosis not present

## 2016-12-07 DIAGNOSIS — I129 Hypertensive chronic kidney disease with stage 1 through stage 4 chronic kidney disease, or unspecified chronic kidney disease: Secondary | ICD-10-CM | POA: Diagnosis not present

## 2016-12-07 DIAGNOSIS — S32591D Other specified fracture of right pubis, subsequent encounter for fracture with routine healing: Secondary | ICD-10-CM | POA: Diagnosis not present

## 2016-12-11 DIAGNOSIS — S7291XD Unspecified fracture of right femur, subsequent encounter for closed fracture with routine healing: Secondary | ICD-10-CM | POA: Diagnosis not present

## 2016-12-11 DIAGNOSIS — I129 Hypertensive chronic kidney disease with stage 1 through stage 4 chronic kidney disease, or unspecified chronic kidney disease: Secondary | ICD-10-CM | POA: Diagnosis not present

## 2016-12-11 DIAGNOSIS — S32592D Other specified fracture of left pubis, subsequent encounter for fracture with routine healing: Secondary | ICD-10-CM | POA: Diagnosis not present

## 2016-12-11 DIAGNOSIS — E119 Type 2 diabetes mellitus without complications: Secondary | ICD-10-CM | POA: Diagnosis not present

## 2016-12-11 DIAGNOSIS — S32591D Other specified fracture of right pubis, subsequent encounter for fracture with routine healing: Secondary | ICD-10-CM | POA: Diagnosis not present

## 2016-12-11 DIAGNOSIS — N189 Chronic kidney disease, unspecified: Secondary | ICD-10-CM | POA: Diagnosis not present

## 2016-12-14 DIAGNOSIS — S32592D Other specified fracture of left pubis, subsequent encounter for fracture with routine healing: Secondary | ICD-10-CM | POA: Diagnosis not present

## 2016-12-14 DIAGNOSIS — S32591D Other specified fracture of right pubis, subsequent encounter for fracture with routine healing: Secondary | ICD-10-CM | POA: Diagnosis not present

## 2016-12-14 DIAGNOSIS — E119 Type 2 diabetes mellitus without complications: Secondary | ICD-10-CM | POA: Diagnosis not present

## 2016-12-14 DIAGNOSIS — I129 Hypertensive chronic kidney disease with stage 1 through stage 4 chronic kidney disease, or unspecified chronic kidney disease: Secondary | ICD-10-CM | POA: Diagnosis not present

## 2016-12-14 DIAGNOSIS — N189 Chronic kidney disease, unspecified: Secondary | ICD-10-CM | POA: Diagnosis not present

## 2016-12-14 DIAGNOSIS — S7291XD Unspecified fracture of right femur, subsequent encounter for closed fracture with routine healing: Secondary | ICD-10-CM | POA: Diagnosis not present

## 2016-12-17 ENCOUNTER — Other Ambulatory Visit: Payer: Self-pay | Admitting: Family Medicine

## 2016-12-17 DIAGNOSIS — E119 Type 2 diabetes mellitus without complications: Secondary | ICD-10-CM | POA: Diagnosis not present

## 2016-12-17 DIAGNOSIS — I129 Hypertensive chronic kidney disease with stage 1 through stage 4 chronic kidney disease, or unspecified chronic kidney disease: Secondary | ICD-10-CM | POA: Diagnosis not present

## 2016-12-17 DIAGNOSIS — S7291XD Unspecified fracture of right femur, subsequent encounter for closed fracture with routine healing: Secondary | ICD-10-CM | POA: Diagnosis not present

## 2016-12-17 DIAGNOSIS — S32592D Other specified fracture of left pubis, subsequent encounter for fracture with routine healing: Secondary | ICD-10-CM | POA: Diagnosis not present

## 2016-12-17 DIAGNOSIS — N189 Chronic kidney disease, unspecified: Secondary | ICD-10-CM | POA: Diagnosis not present

## 2016-12-17 DIAGNOSIS — S32591D Other specified fracture of right pubis, subsequent encounter for fracture with routine healing: Secondary | ICD-10-CM | POA: Diagnosis not present

## 2016-12-19 DIAGNOSIS — S32592D Other specified fracture of left pubis, subsequent encounter for fracture with routine healing: Secondary | ICD-10-CM | POA: Diagnosis not present

## 2016-12-19 DIAGNOSIS — I129 Hypertensive chronic kidney disease with stage 1 through stage 4 chronic kidney disease, or unspecified chronic kidney disease: Secondary | ICD-10-CM | POA: Diagnosis not present

## 2016-12-19 DIAGNOSIS — N189 Chronic kidney disease, unspecified: Secondary | ICD-10-CM | POA: Diagnosis not present

## 2016-12-19 DIAGNOSIS — S7291XD Unspecified fracture of right femur, subsequent encounter for closed fracture with routine healing: Secondary | ICD-10-CM | POA: Diagnosis not present

## 2016-12-19 DIAGNOSIS — S32591D Other specified fracture of right pubis, subsequent encounter for fracture with routine healing: Secondary | ICD-10-CM | POA: Diagnosis not present

## 2016-12-19 DIAGNOSIS — E119 Type 2 diabetes mellitus without complications: Secondary | ICD-10-CM | POA: Diagnosis not present

## 2016-12-24 DIAGNOSIS — E119 Type 2 diabetes mellitus without complications: Secondary | ICD-10-CM | POA: Diagnosis not present

## 2016-12-24 DIAGNOSIS — N189 Chronic kidney disease, unspecified: Secondary | ICD-10-CM | POA: Diagnosis not present

## 2016-12-24 DIAGNOSIS — S32592D Other specified fracture of left pubis, subsequent encounter for fracture with routine healing: Secondary | ICD-10-CM | POA: Diagnosis not present

## 2016-12-24 DIAGNOSIS — I129 Hypertensive chronic kidney disease with stage 1 through stage 4 chronic kidney disease, or unspecified chronic kidney disease: Secondary | ICD-10-CM | POA: Diagnosis not present

## 2016-12-24 DIAGNOSIS — S32591D Other specified fracture of right pubis, subsequent encounter for fracture with routine healing: Secondary | ICD-10-CM | POA: Diagnosis not present

## 2016-12-24 DIAGNOSIS — S7291XD Unspecified fracture of right femur, subsequent encounter for closed fracture with routine healing: Secondary | ICD-10-CM | POA: Diagnosis not present

## 2016-12-26 DIAGNOSIS — S32591D Other specified fracture of right pubis, subsequent encounter for fracture with routine healing: Secondary | ICD-10-CM | POA: Diagnosis not present

## 2016-12-26 DIAGNOSIS — S32592D Other specified fracture of left pubis, subsequent encounter for fracture with routine healing: Secondary | ICD-10-CM | POA: Diagnosis not present

## 2016-12-26 DIAGNOSIS — N189 Chronic kidney disease, unspecified: Secondary | ICD-10-CM | POA: Diagnosis not present

## 2016-12-26 DIAGNOSIS — I129 Hypertensive chronic kidney disease with stage 1 through stage 4 chronic kidney disease, or unspecified chronic kidney disease: Secondary | ICD-10-CM | POA: Diagnosis not present

## 2016-12-26 DIAGNOSIS — S7291XD Unspecified fracture of right femur, subsequent encounter for closed fracture with routine healing: Secondary | ICD-10-CM | POA: Diagnosis not present

## 2016-12-26 DIAGNOSIS — E119 Type 2 diabetes mellitus without complications: Secondary | ICD-10-CM | POA: Diagnosis not present

## 2016-12-31 DIAGNOSIS — N189 Chronic kidney disease, unspecified: Secondary | ICD-10-CM | POA: Diagnosis not present

## 2016-12-31 DIAGNOSIS — I129 Hypertensive chronic kidney disease with stage 1 through stage 4 chronic kidney disease, or unspecified chronic kidney disease: Secondary | ICD-10-CM | POA: Diagnosis not present

## 2016-12-31 DIAGNOSIS — S32591D Other specified fracture of right pubis, subsequent encounter for fracture with routine healing: Secondary | ICD-10-CM | POA: Diagnosis not present

## 2016-12-31 DIAGNOSIS — S32592D Other specified fracture of left pubis, subsequent encounter for fracture with routine healing: Secondary | ICD-10-CM | POA: Diagnosis not present

## 2016-12-31 DIAGNOSIS — E119 Type 2 diabetes mellitus without complications: Secondary | ICD-10-CM | POA: Diagnosis not present

## 2016-12-31 DIAGNOSIS — S7291XD Unspecified fracture of right femur, subsequent encounter for closed fracture with routine healing: Secondary | ICD-10-CM | POA: Diagnosis not present

## 2017-01-02 DIAGNOSIS — S7291XD Unspecified fracture of right femur, subsequent encounter for closed fracture with routine healing: Secondary | ICD-10-CM | POA: Diagnosis not present

## 2017-01-02 DIAGNOSIS — S32592D Other specified fracture of left pubis, subsequent encounter for fracture with routine healing: Secondary | ICD-10-CM | POA: Diagnosis not present

## 2017-01-02 DIAGNOSIS — E119 Type 2 diabetes mellitus without complications: Secondary | ICD-10-CM | POA: Diagnosis not present

## 2017-01-02 DIAGNOSIS — I129 Hypertensive chronic kidney disease with stage 1 through stage 4 chronic kidney disease, or unspecified chronic kidney disease: Secondary | ICD-10-CM | POA: Diagnosis not present

## 2017-01-02 DIAGNOSIS — N189 Chronic kidney disease, unspecified: Secondary | ICD-10-CM | POA: Diagnosis not present

## 2017-01-02 DIAGNOSIS — S32591D Other specified fracture of right pubis, subsequent encounter for fracture with routine healing: Secondary | ICD-10-CM | POA: Diagnosis not present

## 2017-01-08 DIAGNOSIS — S32591D Other specified fracture of right pubis, subsequent encounter for fracture with routine healing: Secondary | ICD-10-CM | POA: Diagnosis not present

## 2017-01-08 DIAGNOSIS — S32592D Other specified fracture of left pubis, subsequent encounter for fracture with routine healing: Secondary | ICD-10-CM | POA: Diagnosis not present

## 2017-01-08 DIAGNOSIS — E119 Type 2 diabetes mellitus without complications: Secondary | ICD-10-CM | POA: Diagnosis not present

## 2017-01-08 DIAGNOSIS — S7291XD Unspecified fracture of right femur, subsequent encounter for closed fracture with routine healing: Secondary | ICD-10-CM | POA: Diagnosis not present

## 2017-01-08 DIAGNOSIS — N189 Chronic kidney disease, unspecified: Secondary | ICD-10-CM | POA: Diagnosis not present

## 2017-01-08 DIAGNOSIS — I129 Hypertensive chronic kidney disease with stage 1 through stage 4 chronic kidney disease, or unspecified chronic kidney disease: Secondary | ICD-10-CM | POA: Diagnosis not present

## 2017-01-09 DIAGNOSIS — N189 Chronic kidney disease, unspecified: Secondary | ICD-10-CM | POA: Diagnosis not present

## 2017-01-09 DIAGNOSIS — S32592D Other specified fracture of left pubis, subsequent encounter for fracture with routine healing: Secondary | ICD-10-CM | POA: Diagnosis not present

## 2017-01-09 DIAGNOSIS — E119 Type 2 diabetes mellitus without complications: Secondary | ICD-10-CM | POA: Diagnosis not present

## 2017-01-09 DIAGNOSIS — I129 Hypertensive chronic kidney disease with stage 1 through stage 4 chronic kidney disease, or unspecified chronic kidney disease: Secondary | ICD-10-CM | POA: Diagnosis not present

## 2017-01-09 DIAGNOSIS — S7291XD Unspecified fracture of right femur, subsequent encounter for closed fracture with routine healing: Secondary | ICD-10-CM | POA: Diagnosis not present

## 2017-01-09 DIAGNOSIS — S32591D Other specified fracture of right pubis, subsequent encounter for fracture with routine healing: Secondary | ICD-10-CM | POA: Diagnosis not present

## 2017-01-13 DIAGNOSIS — I129 Hypertensive chronic kidney disease with stage 1 through stage 4 chronic kidney disease, or unspecified chronic kidney disease: Secondary | ICD-10-CM | POA: Diagnosis not present

## 2017-01-13 DIAGNOSIS — S32591D Other specified fracture of right pubis, subsequent encounter for fracture with routine healing: Secondary | ICD-10-CM | POA: Diagnosis not present

## 2017-01-13 DIAGNOSIS — S32592D Other specified fracture of left pubis, subsequent encounter for fracture with routine healing: Secondary | ICD-10-CM | POA: Diagnosis not present

## 2017-01-13 DIAGNOSIS — E119 Type 2 diabetes mellitus without complications: Secondary | ICD-10-CM | POA: Diagnosis not present

## 2017-01-13 DIAGNOSIS — N189 Chronic kidney disease, unspecified: Secondary | ICD-10-CM | POA: Diagnosis not present

## 2017-01-13 DIAGNOSIS — S7291XD Unspecified fracture of right femur, subsequent encounter for closed fracture with routine healing: Secondary | ICD-10-CM | POA: Diagnosis not present

## 2017-01-15 DIAGNOSIS — E119 Type 2 diabetes mellitus without complications: Secondary | ICD-10-CM | POA: Diagnosis not present

## 2017-01-15 DIAGNOSIS — I129 Hypertensive chronic kidney disease with stage 1 through stage 4 chronic kidney disease, or unspecified chronic kidney disease: Secondary | ICD-10-CM | POA: Diagnosis not present

## 2017-01-15 DIAGNOSIS — S32591D Other specified fracture of right pubis, subsequent encounter for fracture with routine healing: Secondary | ICD-10-CM | POA: Diagnosis not present

## 2017-01-15 DIAGNOSIS — S32592D Other specified fracture of left pubis, subsequent encounter for fracture with routine healing: Secondary | ICD-10-CM | POA: Diagnosis not present

## 2017-01-15 DIAGNOSIS — S7291XD Unspecified fracture of right femur, subsequent encounter for closed fracture with routine healing: Secondary | ICD-10-CM | POA: Diagnosis not present

## 2017-01-15 DIAGNOSIS — N189 Chronic kidney disease, unspecified: Secondary | ICD-10-CM | POA: Diagnosis not present

## 2017-01-21 DIAGNOSIS — N189 Chronic kidney disease, unspecified: Secondary | ICD-10-CM | POA: Diagnosis not present

## 2017-01-21 DIAGNOSIS — E119 Type 2 diabetes mellitus without complications: Secondary | ICD-10-CM | POA: Diagnosis not present

## 2017-01-21 DIAGNOSIS — S32592D Other specified fracture of left pubis, subsequent encounter for fracture with routine healing: Secondary | ICD-10-CM | POA: Diagnosis not present

## 2017-01-21 DIAGNOSIS — I129 Hypertensive chronic kidney disease with stage 1 through stage 4 chronic kidney disease, or unspecified chronic kidney disease: Secondary | ICD-10-CM | POA: Diagnosis not present

## 2017-01-21 DIAGNOSIS — S7291XD Unspecified fracture of right femur, subsequent encounter for closed fracture with routine healing: Secondary | ICD-10-CM | POA: Diagnosis not present

## 2017-01-21 DIAGNOSIS — S32591D Other specified fracture of right pubis, subsequent encounter for fracture with routine healing: Secondary | ICD-10-CM | POA: Diagnosis not present

## 2017-01-23 DIAGNOSIS — N189 Chronic kidney disease, unspecified: Secondary | ICD-10-CM | POA: Diagnosis not present

## 2017-01-23 DIAGNOSIS — E119 Type 2 diabetes mellitus without complications: Secondary | ICD-10-CM | POA: Diagnosis not present

## 2017-01-23 DIAGNOSIS — I129 Hypertensive chronic kidney disease with stage 1 through stage 4 chronic kidney disease, or unspecified chronic kidney disease: Secondary | ICD-10-CM | POA: Diagnosis not present

## 2017-01-23 DIAGNOSIS — S7291XD Unspecified fracture of right femur, subsequent encounter for closed fracture with routine healing: Secondary | ICD-10-CM | POA: Diagnosis not present

## 2017-01-23 DIAGNOSIS — S32592D Other specified fracture of left pubis, subsequent encounter for fracture with routine healing: Secondary | ICD-10-CM | POA: Diagnosis not present

## 2017-01-23 DIAGNOSIS — S32591D Other specified fracture of right pubis, subsequent encounter for fracture with routine healing: Secondary | ICD-10-CM | POA: Diagnosis not present

## 2017-01-30 DIAGNOSIS — I350 Nonrheumatic aortic (valve) stenosis: Secondary | ICD-10-CM | POA: Diagnosis not present

## 2017-01-30 DIAGNOSIS — Z951 Presence of aortocoronary bypass graft: Secondary | ICD-10-CM | POA: Diagnosis not present

## 2017-01-30 DIAGNOSIS — I5032 Chronic diastolic (congestive) heart failure: Secondary | ICD-10-CM | POA: Diagnosis not present

## 2017-01-30 DIAGNOSIS — I442 Atrioventricular block, complete: Secondary | ICD-10-CM | POA: Diagnosis not present

## 2017-01-30 DIAGNOSIS — I10 Essential (primary) hypertension: Secondary | ICD-10-CM | POA: Diagnosis not present

## 2017-01-30 DIAGNOSIS — Z9889 Other specified postprocedural states: Secondary | ICD-10-CM | POA: Diagnosis not present

## 2017-01-30 DIAGNOSIS — E782 Mixed hyperlipidemia: Secondary | ICD-10-CM | POA: Diagnosis not present

## 2017-02-12 DIAGNOSIS — D500 Iron deficiency anemia secondary to blood loss (chronic): Secondary | ICD-10-CM | POA: Diagnosis not present

## 2017-02-12 DIAGNOSIS — Z0000 Encounter for general adult medical examination without abnormal findings: Secondary | ICD-10-CM | POA: Diagnosis not present

## 2017-02-12 DIAGNOSIS — H6192 Disorder of left external ear, unspecified: Secondary | ICD-10-CM | POA: Diagnosis not present

## 2017-02-12 DIAGNOSIS — E538 Deficiency of other specified B group vitamins: Secondary | ICD-10-CM | POA: Diagnosis not present

## 2017-02-12 DIAGNOSIS — E118 Type 2 diabetes mellitus with unspecified complications: Secondary | ICD-10-CM | POA: Diagnosis not present

## 2017-02-12 DIAGNOSIS — E1121 Type 2 diabetes mellitus with diabetic nephropathy: Secondary | ICD-10-CM | POA: Diagnosis not present

## 2017-03-04 DIAGNOSIS — B079 Viral wart, unspecified: Secondary | ICD-10-CM | POA: Diagnosis not present

## 2017-03-04 DIAGNOSIS — D485 Neoplasm of uncertain behavior of skin: Secondary | ICD-10-CM | POA: Diagnosis not present

## 2017-03-04 DIAGNOSIS — Z08 Encounter for follow-up examination after completed treatment for malignant neoplasm: Secondary | ICD-10-CM | POA: Diagnosis not present

## 2017-03-04 DIAGNOSIS — Z85828 Personal history of other malignant neoplasm of skin: Secondary | ICD-10-CM | POA: Diagnosis not present

## 2017-04-09 DIAGNOSIS — H02105 Unspecified ectropion of left lower eyelid: Secondary | ICD-10-CM | POA: Diagnosis not present

## 2017-05-09 DIAGNOSIS — I10 Essential (primary) hypertension: Secondary | ICD-10-CM | POA: Diagnosis not present

## 2017-05-09 DIAGNOSIS — Z9889 Other specified postprocedural states: Secondary | ICD-10-CM | POA: Diagnosis not present

## 2017-05-09 DIAGNOSIS — I5032 Chronic diastolic (congestive) heart failure: Secondary | ICD-10-CM | POA: Diagnosis not present

## 2017-05-09 DIAGNOSIS — Z951 Presence of aortocoronary bypass graft: Secondary | ICD-10-CM | POA: Diagnosis not present

## 2017-05-09 DIAGNOSIS — I442 Atrioventricular block, complete: Secondary | ICD-10-CM | POA: Diagnosis not present

## 2017-05-09 DIAGNOSIS — I350 Nonrheumatic aortic (valve) stenosis: Secondary | ICD-10-CM | POA: Diagnosis not present

## 2017-05-09 DIAGNOSIS — E1121 Type 2 diabetes mellitus with diabetic nephropathy: Secondary | ICD-10-CM | POA: Diagnosis not present

## 2017-05-09 DIAGNOSIS — I739 Peripheral vascular disease, unspecified: Secondary | ICD-10-CM | POA: Diagnosis not present

## 2017-05-09 DIAGNOSIS — J449 Chronic obstructive pulmonary disease, unspecified: Secondary | ICD-10-CM | POA: Diagnosis not present

## 2017-06-24 IMAGING — CT CT PELVIS W/O CM
2 of 3 series · 16 of 46 positions shown, 18 images · non-contrast
Comparison: 11/15/2013 plain film exam.  09/14/2004 CT.

CLINICAL DATA: [AGE] male post fall with pain left upper leg
from groin to knee with motion. Pacemaker in place. Initial
encounter.

EXAM:
CT PELVIS WITHOUT CONTRAST
TECHNIQUE: Multidetector CT imaging of the pelvis was performed following the
standard protocol without intravenous contrast.

[Series 3: axial st · axial · 0.84mm/px · z∈[-265,-21]mm · 13 of 140 slices shown, 15 images]
[im 9/140  soft-tissue]
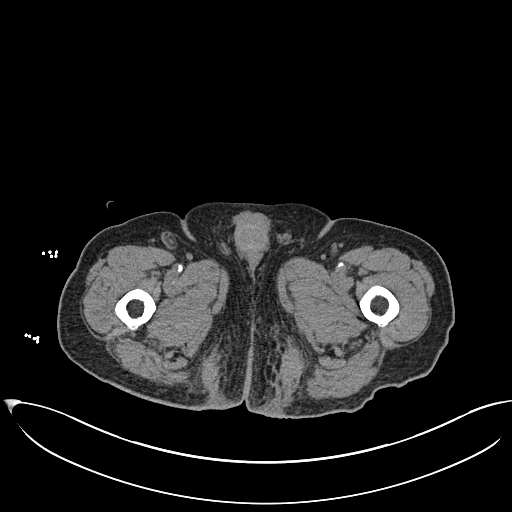
[im 9/140  bone]
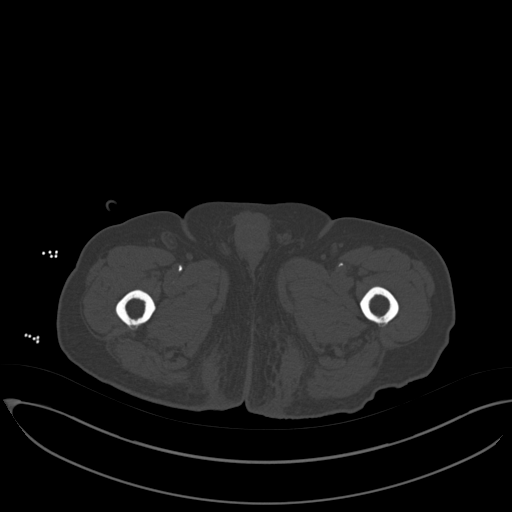
[im 18/140  soft-tissue]
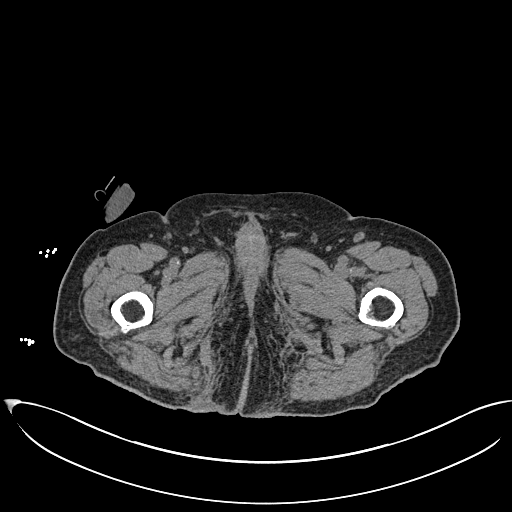
[im 27/140  soft-tissue]
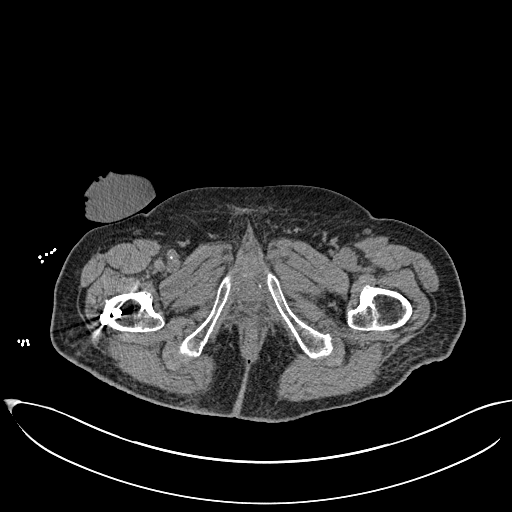
[im 41/140  soft-tissue]
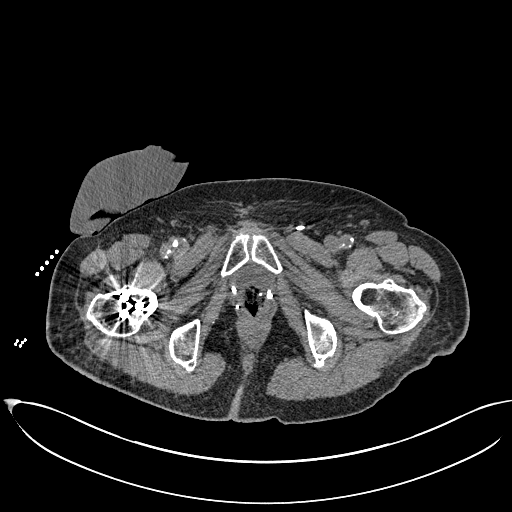
[im 50/140  soft-tissue]
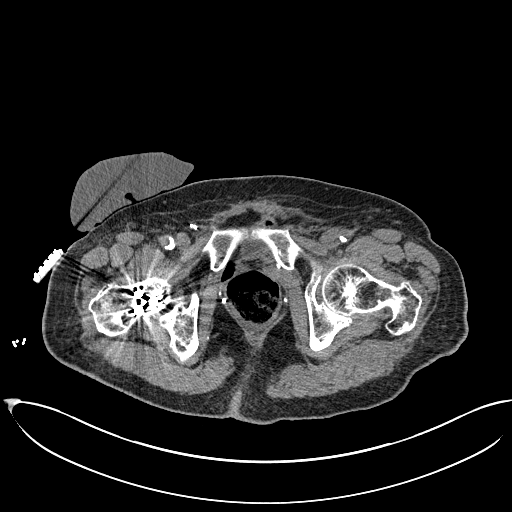
[im 59/140  soft-tissue]
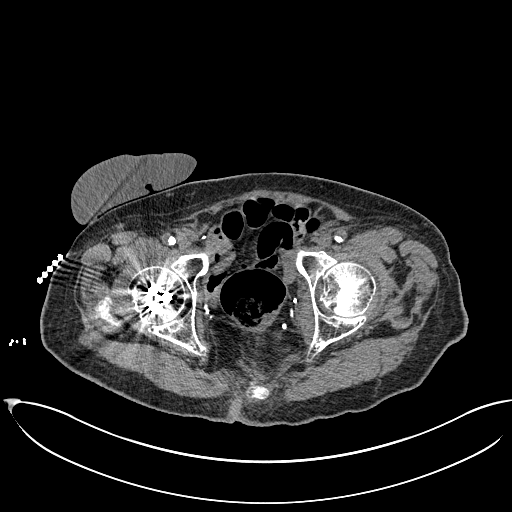
[im 72/140  soft-tissue]
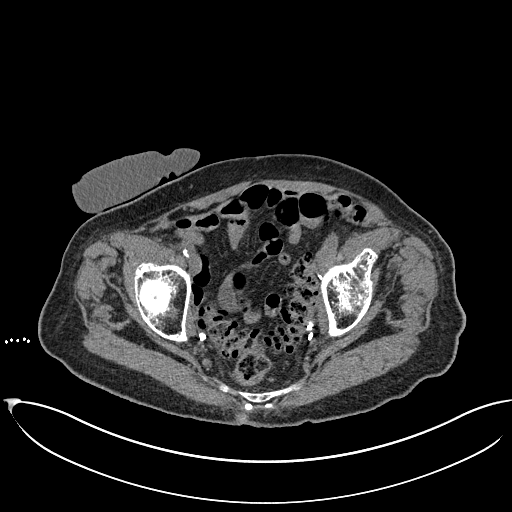
[im 81/140  soft-tissue]
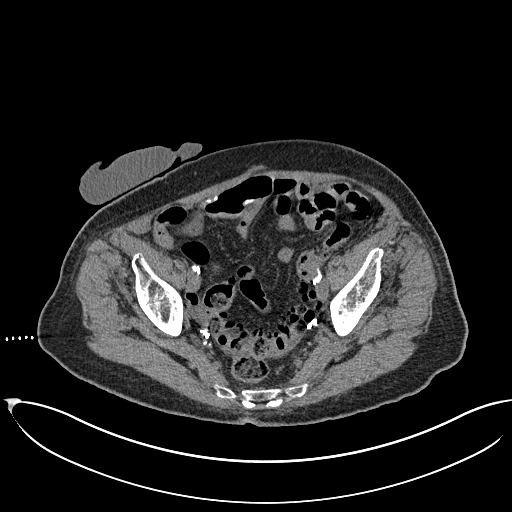
[im 90/140  soft-tissue]
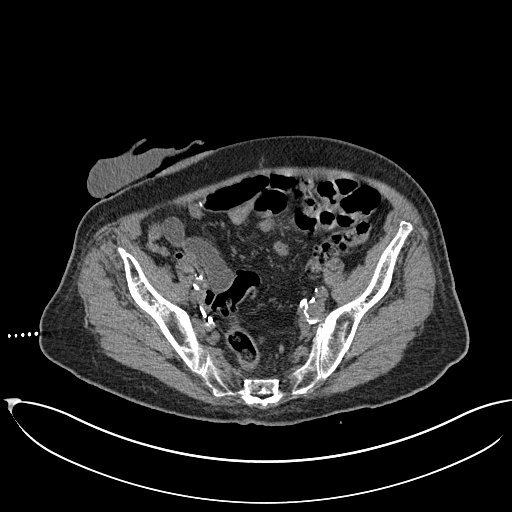
[im 90/140  bone]
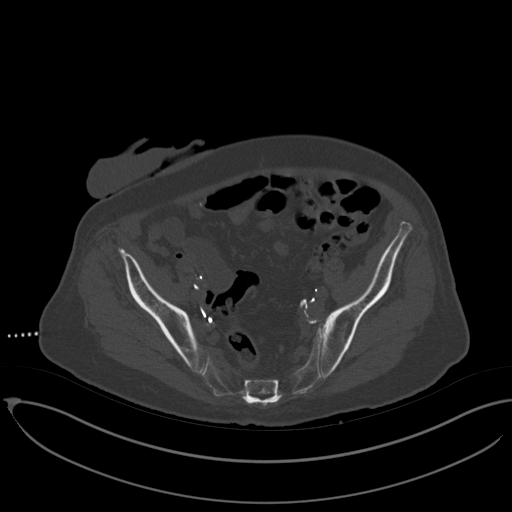
[im 99/140  soft-tissue]
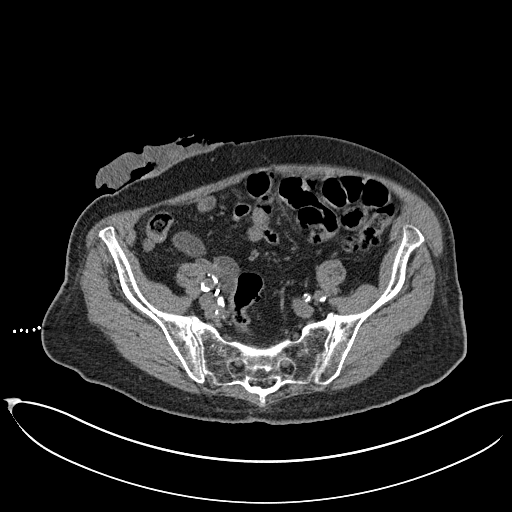
[im 113/140  soft-tissue]
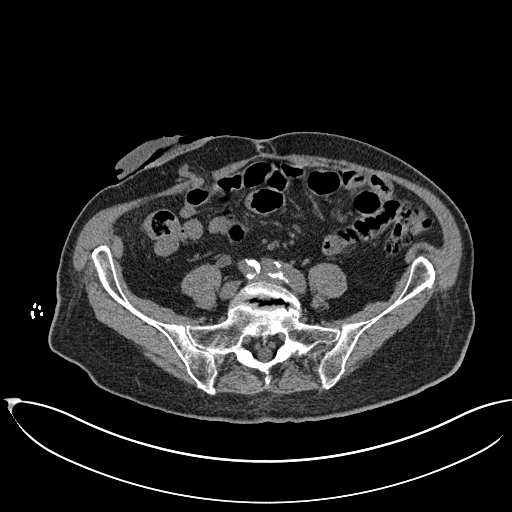
[im 122/140  soft-tissue]
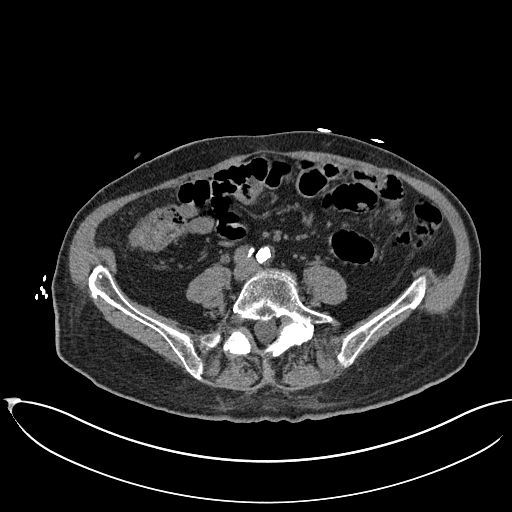
[im 131/140  soft-tissue]
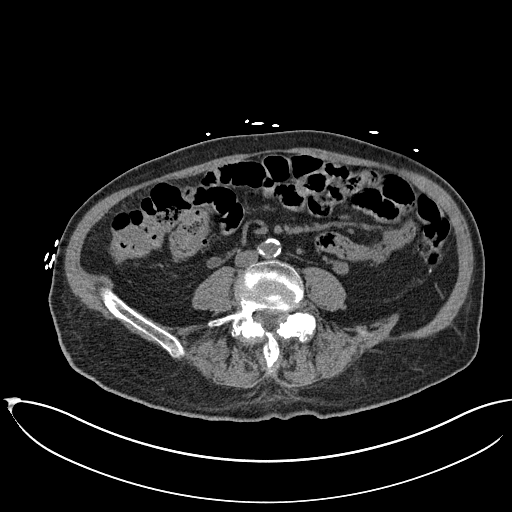

[Series 6: coronal st · coronal · 0.55mm/px · 3 of 120 slices shown]
[im 40/120  soft-tissue]
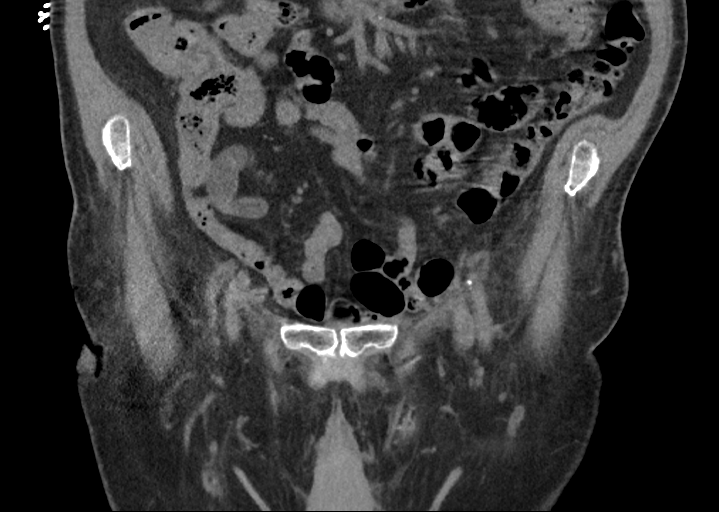
[im 53/120  soft-tissue]
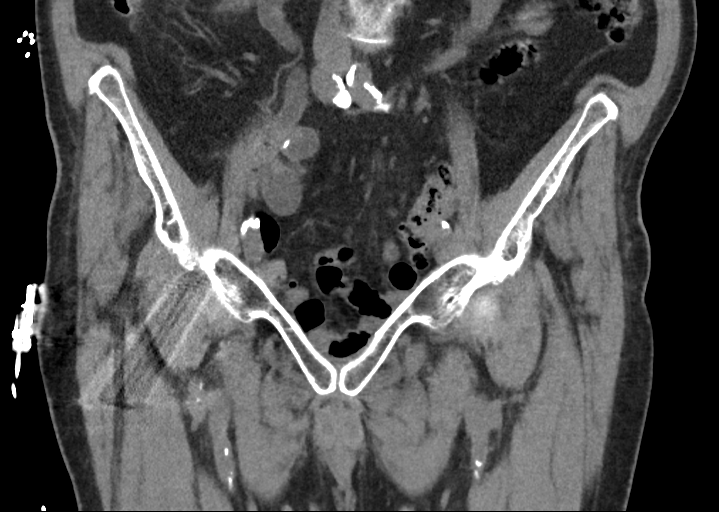
[im 67/120  soft-tissue]
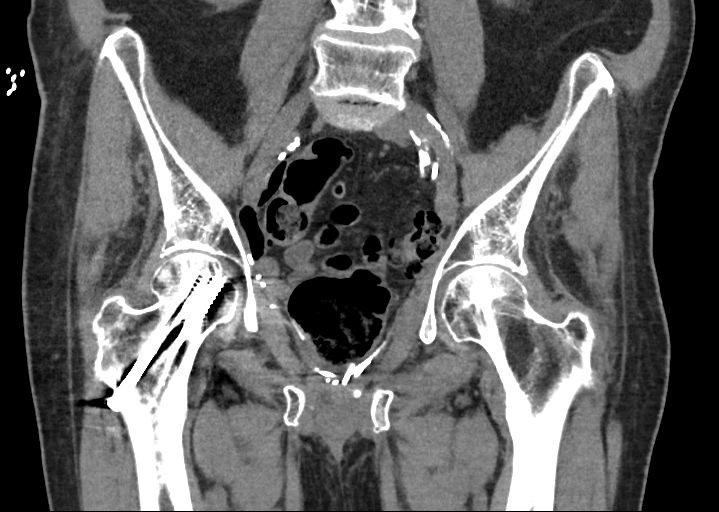

[16 of 46 positions shown; findings below may reference images not displayed]

FINDINGS: No left hip fracture or pelvic fracture noted. Remote right hip
fracture treated with pains.

Degenerative changes L4-5 and L5-S1.

Prior pelvic surgery. Post cystectomy with drainage via a right
uretero- ileostomy. Surgical clips in place. Vascular
calcifications.

Sigmoid diverticula.
IMPRESSION: No left hip fracture or pelvic fracture noted. Remote right hip
fracture treated with pains.

Degenerative changes L4-5 and L5-S1.

## 2017-08-21 DIAGNOSIS — I5032 Chronic diastolic (congestive) heart failure: Secondary | ICD-10-CM | POA: Diagnosis not present

## 2017-08-21 DIAGNOSIS — J449 Chronic obstructive pulmonary disease, unspecified: Secondary | ICD-10-CM | POA: Diagnosis not present

## 2017-08-21 DIAGNOSIS — I442 Atrioventricular block, complete: Secondary | ICD-10-CM | POA: Diagnosis not present

## 2017-08-21 DIAGNOSIS — I10 Essential (primary) hypertension: Secondary | ICD-10-CM | POA: Diagnosis not present

## 2017-08-21 DIAGNOSIS — E782 Mixed hyperlipidemia: Secondary | ICD-10-CM | POA: Diagnosis not present

## 2017-08-21 DIAGNOSIS — I350 Nonrheumatic aortic (valve) stenosis: Secondary | ICD-10-CM | POA: Diagnosis not present

## 2017-08-21 DIAGNOSIS — Z951 Presence of aortocoronary bypass graft: Secondary | ICD-10-CM | POA: Diagnosis not present

## 2017-08-21 DIAGNOSIS — E118 Type 2 diabetes mellitus with unspecified complications: Secondary | ICD-10-CM | POA: Diagnosis not present

## 2017-08-21 DIAGNOSIS — Z9889 Other specified postprocedural states: Secondary | ICD-10-CM | POA: Diagnosis not present

## 2017-08-21 DIAGNOSIS — N183 Chronic kidney disease, stage 3 (moderate): Secondary | ICD-10-CM | POA: Diagnosis not present

## 2017-09-03 DIAGNOSIS — D500 Iron deficiency anemia secondary to blood loss (chronic): Secondary | ICD-10-CM | POA: Diagnosis not present

## 2017-09-03 DIAGNOSIS — E118 Type 2 diabetes mellitus with unspecified complications: Secondary | ICD-10-CM | POA: Diagnosis not present

## 2017-09-03 DIAGNOSIS — E538 Deficiency of other specified B group vitamins: Secondary | ICD-10-CM | POA: Diagnosis not present

## 2017-09-10 DIAGNOSIS — E538 Deficiency of other specified B group vitamins: Secondary | ICD-10-CM | POA: Diagnosis not present

## 2017-09-10 DIAGNOSIS — E1151 Type 2 diabetes mellitus with diabetic peripheral angiopathy without gangrene: Secondary | ICD-10-CM | POA: Diagnosis not present

## 2017-09-10 DIAGNOSIS — D638 Anemia in other chronic diseases classified elsewhere: Secondary | ICD-10-CM | POA: Diagnosis not present

## 2017-09-10 DIAGNOSIS — N183 Chronic kidney disease, stage 3 (moderate): Secondary | ICD-10-CM | POA: Diagnosis not present

## 2017-09-10 DIAGNOSIS — Z0000 Encounter for general adult medical examination without abnormal findings: Secondary | ICD-10-CM | POA: Diagnosis not present

## 2017-09-10 DIAGNOSIS — I739 Peripheral vascular disease, unspecified: Secondary | ICD-10-CM | POA: Diagnosis not present

## 2017-10-08 DIAGNOSIS — R69 Illness, unspecified: Secondary | ICD-10-CM | POA: Diagnosis not present

## 2017-10-22 DIAGNOSIS — I739 Peripheral vascular disease, unspecified: Secondary | ICD-10-CM | POA: Diagnosis not present

## 2017-10-22 DIAGNOSIS — I10 Essential (primary) hypertension: Secondary | ICD-10-CM | POA: Diagnosis not present

## 2017-10-22 DIAGNOSIS — E1151 Type 2 diabetes mellitus with diabetic peripheral angiopathy without gangrene: Secondary | ICD-10-CM | POA: Diagnosis not present

## 2017-10-22 DIAGNOSIS — I442 Atrioventricular block, complete: Secondary | ICD-10-CM | POA: Diagnosis not present

## 2017-10-22 DIAGNOSIS — I350 Nonrheumatic aortic (valve) stenosis: Secondary | ICD-10-CM | POA: Diagnosis not present

## 2017-10-22 DIAGNOSIS — I5032 Chronic diastolic (congestive) heart failure: Secondary | ICD-10-CM | POA: Diagnosis not present

## 2017-10-22 DIAGNOSIS — J449 Chronic obstructive pulmonary disease, unspecified: Secondary | ICD-10-CM | POA: Diagnosis not present

## 2017-10-22 DIAGNOSIS — Z951 Presence of aortocoronary bypass graft: Secondary | ICD-10-CM | POA: Diagnosis not present

## 2017-10-22 DIAGNOSIS — E1121 Type 2 diabetes mellitus with diabetic nephropathy: Secondary | ICD-10-CM | POA: Diagnosis not present

## 2018-01-07 DIAGNOSIS — I442 Atrioventricular block, complete: Secondary | ICD-10-CM | POA: Diagnosis not present

## 2018-02-27 DIAGNOSIS — Z9889 Other specified postprocedural states: Secondary | ICD-10-CM | POA: Diagnosis not present

## 2018-02-27 DIAGNOSIS — Z951 Presence of aortocoronary bypass graft: Secondary | ICD-10-CM | POA: Diagnosis not present

## 2018-02-27 DIAGNOSIS — I350 Nonrheumatic aortic (valve) stenosis: Secondary | ICD-10-CM | POA: Diagnosis not present

## 2018-02-27 DIAGNOSIS — I10 Essential (primary) hypertension: Secondary | ICD-10-CM | POA: Diagnosis not present

## 2018-02-27 DIAGNOSIS — I5032 Chronic diastolic (congestive) heart failure: Secondary | ICD-10-CM | POA: Diagnosis not present

## 2018-02-27 DIAGNOSIS — N183 Chronic kidney disease, stage 3 (moderate): Secondary | ICD-10-CM | POA: Diagnosis not present

## 2018-02-27 DIAGNOSIS — E782 Mixed hyperlipidemia: Secondary | ICD-10-CM | POA: Diagnosis not present

## 2018-03-06 DIAGNOSIS — E538 Deficiency of other specified B group vitamins: Secondary | ICD-10-CM | POA: Diagnosis not present

## 2018-03-06 DIAGNOSIS — E1151 Type 2 diabetes mellitus with diabetic peripheral angiopathy without gangrene: Secondary | ICD-10-CM | POA: Diagnosis not present

## 2018-03-13 DIAGNOSIS — N183 Chronic kidney disease, stage 3 (moderate): Secondary | ICD-10-CM | POA: Diagnosis not present

## 2018-03-13 DIAGNOSIS — E1151 Type 2 diabetes mellitus with diabetic peripheral angiopathy without gangrene: Secondary | ICD-10-CM | POA: Diagnosis not present

## 2018-03-13 DIAGNOSIS — E1121 Type 2 diabetes mellitus with diabetic nephropathy: Secondary | ICD-10-CM | POA: Diagnosis not present

## 2018-03-13 DIAGNOSIS — I739 Peripheral vascular disease, unspecified: Secondary | ICD-10-CM | POA: Diagnosis not present

## 2018-03-13 DIAGNOSIS — I5032 Chronic diastolic (congestive) heart failure: Secondary | ICD-10-CM | POA: Diagnosis not present

## 2018-03-13 DIAGNOSIS — J431 Panlobular emphysema: Secondary | ICD-10-CM | POA: Diagnosis not present

## 2018-04-02 DIAGNOSIS — I442 Atrioventricular block, complete: Secondary | ICD-10-CM | POA: Diagnosis not present

## 2018-06-24 DIAGNOSIS — E1151 Type 2 diabetes mellitus with diabetic peripheral angiopathy without gangrene: Secondary | ICD-10-CM | POA: Diagnosis not present

## 2018-06-24 DIAGNOSIS — Z9889 Other specified postprocedural states: Secondary | ICD-10-CM | POA: Diagnosis not present

## 2018-06-24 DIAGNOSIS — I5032 Chronic diastolic (congestive) heart failure: Secondary | ICD-10-CM | POA: Diagnosis not present

## 2018-06-24 DIAGNOSIS — I10 Essential (primary) hypertension: Secondary | ICD-10-CM | POA: Diagnosis not present

## 2018-06-24 DIAGNOSIS — I350 Nonrheumatic aortic (valve) stenosis: Secondary | ICD-10-CM | POA: Diagnosis not present

## 2018-10-06 DIAGNOSIS — Z9181 History of falling: Secondary | ICD-10-CM | POA: Diagnosis not present

## 2018-10-06 DIAGNOSIS — M6281 Muscle weakness (generalized): Secondary | ICD-10-CM | POA: Diagnosis not present

## 2018-10-06 DIAGNOSIS — R41841 Cognitive communication deficit: Secondary | ICD-10-CM | POA: Diagnosis not present

## 2018-10-06 DIAGNOSIS — R2689 Other abnormalities of gait and mobility: Secondary | ICD-10-CM | POA: Diagnosis not present

## 2018-10-06 DIAGNOSIS — R279 Unspecified lack of coordination: Secondary | ICD-10-CM | POA: Diagnosis not present

## 2018-10-14 DIAGNOSIS — I442 Atrioventricular block, complete: Secondary | ICD-10-CM | POA: Diagnosis not present

## 2018-10-24 DIAGNOSIS — L570 Actinic keratosis: Secondary | ICD-10-CM | POA: Diagnosis not present

## 2018-10-28 DIAGNOSIS — R69 Illness, unspecified: Secondary | ICD-10-CM | POA: Diagnosis not present

## 2018-10-29 DIAGNOSIS — Z20828 Contact with and (suspected) exposure to other viral communicable diseases: Secondary | ICD-10-CM | POA: Diagnosis not present

## 2018-10-30 DIAGNOSIS — R41841 Cognitive communication deficit: Secondary | ICD-10-CM | POA: Diagnosis not present

## 2018-10-30 DIAGNOSIS — R279 Unspecified lack of coordination: Secondary | ICD-10-CM | POA: Diagnosis not present

## 2018-10-30 DIAGNOSIS — Z9181 History of falling: Secondary | ICD-10-CM | POA: Diagnosis not present

## 2018-10-30 DIAGNOSIS — M6281 Muscle weakness (generalized): Secondary | ICD-10-CM | POA: Diagnosis not present

## 2018-10-30 DIAGNOSIS — R2689 Other abnormalities of gait and mobility: Secondary | ICD-10-CM | POA: Diagnosis not present

## 2018-11-06 DIAGNOSIS — R2689 Other abnormalities of gait and mobility: Secondary | ICD-10-CM | POA: Diagnosis not present

## 2018-11-06 DIAGNOSIS — M6281 Muscle weakness (generalized): Secondary | ICD-10-CM | POA: Diagnosis not present

## 2018-11-06 DIAGNOSIS — Z9181 History of falling: Secondary | ICD-10-CM | POA: Diagnosis not present

## 2018-11-06 DIAGNOSIS — R279 Unspecified lack of coordination: Secondary | ICD-10-CM | POA: Diagnosis not present

## 2018-11-06 DIAGNOSIS — R41841 Cognitive communication deficit: Secondary | ICD-10-CM | POA: Diagnosis not present

## 2018-11-10 DIAGNOSIS — Z9181 History of falling: Secondary | ICD-10-CM | POA: Diagnosis not present

## 2018-11-10 DIAGNOSIS — R279 Unspecified lack of coordination: Secondary | ICD-10-CM | POA: Diagnosis not present

## 2018-11-10 DIAGNOSIS — R41841 Cognitive communication deficit: Secondary | ICD-10-CM | POA: Diagnosis not present

## 2018-11-10 DIAGNOSIS — M6281 Muscle weakness (generalized): Secondary | ICD-10-CM | POA: Diagnosis not present

## 2018-11-10 DIAGNOSIS — R2689 Other abnormalities of gait and mobility: Secondary | ICD-10-CM | POA: Diagnosis not present

## 2018-11-12 DIAGNOSIS — Z1159 Encounter for screening for other viral diseases: Secondary | ICD-10-CM | POA: Diagnosis not present

## 2018-11-12 DIAGNOSIS — Z20828 Contact with and (suspected) exposure to other viral communicable diseases: Secondary | ICD-10-CM | POA: Diagnosis not present

## 2018-11-24 DIAGNOSIS — Z9181 History of falling: Secondary | ICD-10-CM | POA: Diagnosis not present

## 2018-11-24 DIAGNOSIS — R41841 Cognitive communication deficit: Secondary | ICD-10-CM | POA: Diagnosis not present

## 2018-12-18 DIAGNOSIS — Z951 Presence of aortocoronary bypass graft: Secondary | ICD-10-CM | POA: Diagnosis not present

## 2018-12-18 DIAGNOSIS — E1151 Type 2 diabetes mellitus with diabetic peripheral angiopathy without gangrene: Secondary | ICD-10-CM | POA: Diagnosis not present

## 2018-12-18 DIAGNOSIS — I350 Nonrheumatic aortic (valve) stenosis: Secondary | ICD-10-CM | POA: Diagnosis not present

## 2018-12-18 DIAGNOSIS — I739 Peripheral vascular disease, unspecified: Secondary | ICD-10-CM | POA: Diagnosis not present

## 2018-12-18 DIAGNOSIS — I5032 Chronic diastolic (congestive) heart failure: Secondary | ICD-10-CM | POA: Diagnosis not present

## 2018-12-18 DIAGNOSIS — E1121 Type 2 diabetes mellitus with diabetic nephropathy: Secondary | ICD-10-CM | POA: Diagnosis not present

## 2018-12-18 DIAGNOSIS — J431 Panlobular emphysema: Secondary | ICD-10-CM | POA: Diagnosis not present

## 2018-12-18 DIAGNOSIS — Z9889 Other specified postprocedural states: Secondary | ICD-10-CM | POA: Diagnosis not present

## 2018-12-18 DIAGNOSIS — I10 Essential (primary) hypertension: Secondary | ICD-10-CM | POA: Diagnosis not present

## 2019-01-02 DIAGNOSIS — E538 Deficiency of other specified B group vitamins: Secondary | ICD-10-CM | POA: Diagnosis not present

## 2019-01-02 DIAGNOSIS — E1151 Type 2 diabetes mellitus with diabetic peripheral angiopathy without gangrene: Secondary | ICD-10-CM | POA: Diagnosis not present

## 2019-01-07 DIAGNOSIS — E1151 Type 2 diabetes mellitus with diabetic peripheral angiopathy without gangrene: Secondary | ICD-10-CM | POA: Diagnosis not present

## 2019-01-07 DIAGNOSIS — E1122 Type 2 diabetes mellitus with diabetic chronic kidney disease: Secondary | ICD-10-CM | POA: Diagnosis not present

## 2019-01-07 DIAGNOSIS — Z87891 Personal history of nicotine dependence: Secondary | ICD-10-CM | POA: Diagnosis not present

## 2019-01-07 DIAGNOSIS — Z7984 Long term (current) use of oral hypoglycemic drugs: Secondary | ICD-10-CM | POA: Diagnosis not present

## 2019-01-07 DIAGNOSIS — E538 Deficiency of other specified B group vitamins: Secondary | ICD-10-CM | POA: Diagnosis not present

## 2019-01-07 DIAGNOSIS — Z0000 Encounter for general adult medical examination without abnormal findings: Secondary | ICD-10-CM | POA: Diagnosis not present

## 2019-01-07 DIAGNOSIS — N183 Chronic kidney disease, stage 3 unspecified: Secondary | ICD-10-CM | POA: Diagnosis not present

## 2019-02-03 DIAGNOSIS — I739 Peripheral vascular disease, unspecified: Secondary | ICD-10-CM | POA: Diagnosis not present

## 2019-02-03 DIAGNOSIS — I442 Atrioventricular block, complete: Secondary | ICD-10-CM | POA: Diagnosis not present

## 2019-02-03 DIAGNOSIS — I5032 Chronic diastolic (congestive) heart failure: Secondary | ICD-10-CM | POA: Diagnosis not present

## 2019-02-03 DIAGNOSIS — I350 Nonrheumatic aortic (valve) stenosis: Secondary | ICD-10-CM | POA: Diagnosis not present

## 2019-02-03 DIAGNOSIS — Z01818 Encounter for other preprocedural examination: Secondary | ICD-10-CM | POA: Diagnosis not present

## 2019-02-03 DIAGNOSIS — J9811 Atelectasis: Secondary | ICD-10-CM | POA: Diagnosis not present

## 2019-02-03 DIAGNOSIS — N1831 Chronic kidney disease, stage 3a: Secondary | ICD-10-CM | POA: Diagnosis not present

## 2019-02-03 DIAGNOSIS — J431 Panlobular emphysema: Secondary | ICD-10-CM | POA: Diagnosis not present

## 2019-02-03 DIAGNOSIS — I10 Essential (primary) hypertension: Secondary | ICD-10-CM | POA: Diagnosis not present

## 2019-02-03 DIAGNOSIS — Z951 Presence of aortocoronary bypass graft: Secondary | ICD-10-CM | POA: Diagnosis not present

## 2019-02-03 DIAGNOSIS — E1151 Type 2 diabetes mellitus with diabetic peripheral angiopathy without gangrene: Secondary | ICD-10-CM | POA: Diagnosis not present

## 2019-02-03 DIAGNOSIS — E1121 Type 2 diabetes mellitus with diabetic nephropathy: Secondary | ICD-10-CM | POA: Diagnosis not present

## 2019-02-23 ENCOUNTER — Other Ambulatory Visit
Admission: RE | Admit: 2019-02-23 | Discharge: 2019-02-23 | Disposition: A | Discharge status: Home / Self Care | Payer: Medicare HMO | Source: Ambulatory Visit | Attending: Cardiology | Admitting: Cardiology

## 2019-02-23 ENCOUNTER — Other Ambulatory Visit: Payer: Self-pay

## 2019-02-23 DIAGNOSIS — Z20828 Contact with and (suspected) exposure to other viral communicable diseases: Secondary | ICD-10-CM | POA: Diagnosis not present

## 2019-02-23 DIAGNOSIS — Z01812 Encounter for preprocedural laboratory examination: Secondary | ICD-10-CM | POA: Insufficient documentation

## 2019-02-24 ENCOUNTER — Encounter
Admission: RE | Disposition: A | Discharge status: Home / Self Care | Payer: Self-pay | Source: Home / Self Care | Attending: Cardiology

## 2019-02-24 ENCOUNTER — Ambulatory Visit: Payer: Medicare HMO | Admitting: Anesthesiology

## 2019-02-24 ENCOUNTER — Ambulatory Visit
Admission: RE | Admit: 2019-02-24 | Discharge: 2019-02-24 | Disposition: A | Discharge status: Home / Self Care | Payer: Medicare HMO | Attending: Cardiology | Admitting: Cardiology

## 2019-02-24 ENCOUNTER — Encounter: Payer: Self-pay | Admitting: Cardiology

## 2019-02-24 ENCOUNTER — Other Ambulatory Visit: Payer: Self-pay

## 2019-02-24 DIAGNOSIS — I442 Atrioventricular block, complete: Secondary | ICD-10-CM | POA: Insufficient documentation

## 2019-02-24 DIAGNOSIS — I130 Hypertensive heart and chronic kidney disease with heart failure and stage 1 through stage 4 chronic kidney disease, or unspecified chronic kidney disease: Secondary | ICD-10-CM | POA: Diagnosis not present

## 2019-02-24 DIAGNOSIS — Z7982 Long term (current) use of aspirin: Secondary | ICD-10-CM | POA: Diagnosis not present

## 2019-02-24 DIAGNOSIS — Z951 Presence of aortocoronary bypass graft: Secondary | ICD-10-CM | POA: Insufficient documentation

## 2019-02-24 DIAGNOSIS — I495 Sick sinus syndrome: Secondary | ICD-10-CM | POA: Diagnosis not present

## 2019-02-24 DIAGNOSIS — I5030 Unspecified diastolic (congestive) heart failure: Secondary | ICD-10-CM | POA: Diagnosis not present

## 2019-02-24 DIAGNOSIS — E785 Hyperlipidemia, unspecified: Secondary | ICD-10-CM | POA: Diagnosis not present

## 2019-02-24 DIAGNOSIS — Z7984 Long term (current) use of oral hypoglycemic drugs: Secondary | ICD-10-CM | POA: Diagnosis not present

## 2019-02-24 DIAGNOSIS — E119 Type 2 diabetes mellitus without complications: Secondary | ICD-10-CM | POA: Diagnosis not present

## 2019-02-24 DIAGNOSIS — E1122 Type 2 diabetes mellitus with diabetic chronic kidney disease: Secondary | ICD-10-CM | POA: Diagnosis not present

## 2019-02-24 DIAGNOSIS — Z8546 Personal history of malignant neoplasm of prostate: Secondary | ICD-10-CM | POA: Insufficient documentation

## 2019-02-24 DIAGNOSIS — Z87891 Personal history of nicotine dependence: Secondary | ICD-10-CM | POA: Diagnosis not present

## 2019-02-24 DIAGNOSIS — Z45010 Encounter for checking and testing of cardiac pacemaker pulse generator [battery]: Secondary | ICD-10-CM | POA: Insufficient documentation

## 2019-02-24 DIAGNOSIS — I2510 Atherosclerotic heart disease of native coronary artery without angina pectoris: Secondary | ICD-10-CM | POA: Diagnosis not present

## 2019-02-24 DIAGNOSIS — Z79899 Other long term (current) drug therapy: Secondary | ICD-10-CM | POA: Diagnosis not present

## 2019-02-24 DIAGNOSIS — Z955 Presence of coronary angioplasty implant and graft: Secondary | ICD-10-CM | POA: Insufficient documentation

## 2019-02-24 DIAGNOSIS — I5032 Chronic diastolic (congestive) heart failure: Secondary | ICD-10-CM | POA: Insufficient documentation

## 2019-02-24 DIAGNOSIS — Z8551 Personal history of malignant neoplasm of bladder: Secondary | ICD-10-CM | POA: Diagnosis not present

## 2019-02-24 DIAGNOSIS — J449 Chronic obstructive pulmonary disease, unspecified: Secondary | ICD-10-CM | POA: Diagnosis not present

## 2019-02-24 DIAGNOSIS — I080 Rheumatic disorders of both mitral and aortic valves: Secondary | ICD-10-CM | POA: Insufficient documentation

## 2019-02-24 DIAGNOSIS — N1831 Chronic kidney disease, stage 3a: Secondary | ICD-10-CM | POA: Diagnosis not present

## 2019-02-24 DIAGNOSIS — E1151 Type 2 diabetes mellitus with diabetic peripheral angiopathy without gangrene: Secondary | ICD-10-CM | POA: Insufficient documentation

## 2019-02-24 DIAGNOSIS — E782 Mixed hyperlipidemia: Secondary | ICD-10-CM | POA: Insufficient documentation

## 2019-02-24 DIAGNOSIS — R001 Bradycardia, unspecified: Secondary | ICD-10-CM | POA: Diagnosis not present

## 2019-02-24 HISTORY — PX: PACEMAKER INSERTION: SHX728

## 2019-02-24 LAB — POCT I-STAT, CHEM 8
BUN: 31 mg/dL — ABNORMAL HIGH (ref 8–23)
BUN: 38 mg/dL — ABNORMAL HIGH (ref 8–23)
Calcium, Ion: 0.97 mmol/L — ABNORMAL LOW (ref 1.15–1.40)
Calcium, Ion: 1.23 mmol/L (ref 1.15–1.40)
Chloride: 108 mmol/L (ref 98–111)
Chloride: 114 mmol/L — ABNORMAL HIGH (ref 98–111)
Creatinine, Ser: 1.9 mg/dL — ABNORMAL HIGH (ref 0.61–1.24)
Creatinine, Ser: 1.9 mg/dL — ABNORMAL HIGH (ref 0.61–1.24)
Glucose, Bld: 112 mg/dL — ABNORMAL HIGH (ref 70–99)
Glucose, Bld: 122 mg/dL — ABNORMAL HIGH (ref 70–99)
HCT: 29 % — ABNORMAL LOW (ref 39.0–52.0)
HCT: 30 % — ABNORMAL LOW (ref 39.0–52.0)
Hemoglobin: 10.2 g/dL — ABNORMAL LOW (ref 13.0–17.0)
Hemoglobin: 9.9 g/dL — ABNORMAL LOW (ref 13.0–17.0)
Potassium: 5.1 mmol/L (ref 3.5–5.1)
Potassium: 5.5 mmol/L — ABNORMAL HIGH (ref 3.5–5.1)
Sodium: 138 mmol/L (ref 135–145)
Sodium: 141 mmol/L (ref 135–145)
TCO2: 22 mmol/L (ref 22–32)
TCO2: 23 mmol/L (ref 22–32)

## 2019-02-24 LAB — SARS CORONAVIRUS 2 (TAT 6-24 HRS): SARS Coronavirus 2: NEGATIVE

## 2019-02-24 LAB — GLUCOSE, CAPILLARY
Glucose-Capillary: 105 mg/dL — ABNORMAL HIGH (ref 70–99)
Glucose-Capillary: 92 mg/dL (ref 70–99)

## 2019-02-24 LAB — SURGICAL PCR SCREEN
MRSA, PCR: NEGATIVE
Staphylococcus aureus: POSITIVE — AB

## 2019-02-24 SURGERY — INSERTION, CARDIAC PACEMAKER
Anesthesia: General | Site: Chest | Laterality: Left

## 2019-02-24 MED ORDER — CEFAZOLIN SODIUM-DEXTROSE 2-4 GM/100ML-% IV SOLN
2.0000 g | INTRAVENOUS | Status: AC
  Administered 2019-02-24: 2 g via INTRAVENOUS

## 2019-02-24 MED ORDER — FAMOTIDINE 20 MG PO TABS
ORAL_TABLET | ORAL | Status: AC
  Administered 2019-02-24: 12:00:00 20 mg via ORAL
  Filled 2019-02-24: qty 1

## 2019-02-24 MED ORDER — PROPOFOL 10 MG/ML IV BOLUS
INTRAVENOUS | Status: DC | PRN
  Administered 2019-02-24: 20 mg via INTRAVENOUS
  Administered 2019-02-24: 10 mg via INTRAVENOUS
  Administered 2019-02-24: 30 mg via INTRAVENOUS
  Administered 2019-02-24: 10 mg via INTRAVENOUS

## 2019-02-24 MED ORDER — FAMOTIDINE 20 MG PO TABS: 20.0000 mg | ORAL_TABLET | Freq: Once | ORAL | Status: AC

## 2019-02-24 MED ORDER — CEPHALEXIN 250 MG PO CAPS: 250.0000 mg | ORAL_CAPSULE | Freq: Two times a day (BID) | ORAL | 0 refills | Status: DC

## 2019-02-24 MED ORDER — OXYCODONE HCL 5 MG PO TABS: 5.0000 mg | ORAL_TABLET | Freq: Once | ORAL | Status: DC | PRN

## 2019-02-24 MED ORDER — SODIUM CHLORIDE 0.9 % IV SOLN: INTRAVENOUS | Status: DC

## 2019-02-24 MED ORDER — GENTAMICIN SULFATE 40 MG/ML IJ SOLN
INTRAMUSCULAR | Status: AC
  Filled 2019-02-24: qty 2

## 2019-02-24 MED ORDER — FENTANYL CITRATE (PF) 100 MCG/2ML IJ SOLN: 25.0000 ug | INTRAMUSCULAR | Status: DC | PRN

## 2019-02-24 MED ORDER — LIDOCAINE HCL (PF) 2 % IJ SOLN
INTRAMUSCULAR | Status: DC | PRN
  Administered 2019-02-24: 60 mg via INTRADERMAL

## 2019-02-24 MED ORDER — OXYCODONE HCL 5 MG/5ML PO SOLN: 5.0000 mg | Freq: Once | ORAL | Status: DC | PRN

## 2019-02-24 MED ORDER — CEFAZOLIN SODIUM-DEXTROSE 2-4 GM/100ML-% IV SOLN
INTRAVENOUS | Status: AC
  Filled 2019-02-24: qty 100

## 2019-02-24 MED ORDER — LIDOCAINE HCL (PF) 2 % IJ SOLN
INTRAMUSCULAR | Status: AC
  Filled 2019-02-24: qty 10

## 2019-02-24 MED ORDER — LIDOCAINE 1 % OPTIME INJ - NO CHARGE
INTRAMUSCULAR | Status: DC | PRN
  Administered 2019-02-24: 20 mL

## 2019-02-24 MED ORDER — ACETAMINOPHEN 325 MG PO TABS: 325.0000 mg | ORAL_TABLET | ORAL | Status: DC | PRN

## 2019-02-24 MED ORDER — PROPOFOL 10 MG/ML IV BOLUS
INTRAVENOUS | Status: AC
  Filled 2019-02-24: qty 20

## 2019-02-24 MED ORDER — CEFAZOLIN SODIUM 1 G IJ SOLR
INTRAMUSCULAR | Status: AC
  Filled 2019-02-24: qty 20

## 2019-02-24 MED ORDER — SODIUM CHLORIDE 0.9 % IV SOLN
80.0000 mg | INTRAVENOUS | Status: DC
  Filled 2019-02-24: qty 2

## 2019-02-24 MED ORDER — ONDANSETRON HCL 4 MG/2ML IJ SOLN: 4.0000 mg | Freq: Four times a day (QID) | INTRAMUSCULAR | Status: DC | PRN

## 2019-02-24 SURGICAL SUPPLY — 38 items
BAG DECANTER FOR FLEXI CONT (MISCELLANEOUS) ×3 IMPLANT
BLADE PHOTON ILLUMINATED (MISCELLANEOUS) ×3 IMPLANT
BRUSH SCRUB EZ  4% CHG (MISCELLANEOUS) ×2
BRUSH SCRUB EZ 4% CHG (MISCELLANEOUS) ×1 IMPLANT
CABLE SURG 12 DISP A/V CHANNEL (MISCELLANEOUS) ×3 IMPLANT
CANISTER SUCT 1200ML W/VALVE (MISCELLANEOUS) ×3 IMPLANT
CHLORAPREP W/TINT 26 (MISCELLANEOUS) ×3 IMPLANT
COVER LIGHT HANDLE STERIS (MISCELLANEOUS) ×6 IMPLANT
COVER MAYO STAND REUSABLE (DRAPES) ×3 IMPLANT
COVER WAND RF STERILE (DRAPES) ×3 IMPLANT
DRAPE C-ARM XRAY 36X54 (DRAPES) ×3 IMPLANT
DRSG TEGADERM 4X4.75 (GAUZE/BANDAGES/DRESSINGS) ×3 IMPLANT
DRSG TELFA 4X3 1S NADH ST (GAUZE/BANDAGES/DRESSINGS) ×3 IMPLANT
ELECT REM PT RETURN 9FT ADLT (ELECTROSURGICAL) ×3
ELECTRODE REM PT RTRN 9FT ADLT (ELECTROSURGICAL) ×1 IMPLANT
GLIDEWIRE STIFF .35X180X3 HYDR (WIRE) IMPLANT
GLOVE BIO SURGEON STRL SZ7.5 (GLOVE) ×3 IMPLANT
GLOVE BIO SURGEON STRL SZ8 (GLOVE) ×3 IMPLANT
GOWN STRL REUS W/ TWL LRG LVL3 (GOWN DISPOSABLE) ×1 IMPLANT
GOWN STRL REUS W/ TWL XL LVL3 (GOWN DISPOSABLE) ×1 IMPLANT
GOWN STRL REUS W/TWL LRG LVL3 (GOWN DISPOSABLE) ×2
GOWN STRL REUS W/TWL XL LVL3 (GOWN DISPOSABLE) ×2
IMMOBILIZER SHDR MD LX WHT (SOFTGOODS) IMPLANT
IMMOBILIZER SHDR XL LX WHT (SOFTGOODS) IMPLANT
INTRO PACEMAKR LEAD 9FR 13CM (INTRODUCER)
INTRO PACEMKR SHEATH II 7FR (MISCELLANEOUS)
INTRODUCER PACEMKR LD 9FR 13CM (INTRODUCER) IMPLANT
INTRODUCER PACEMKR SHTH II 7FR (MISCELLANEOUS) IMPLANT
IPG PACE AZUR XT DR MRI W1DR01 (Pacemaker) ×1 IMPLANT
IV NS 500ML (IV SOLUTION) ×2
IV NS 500ML BAXH (IV SOLUTION) ×1 IMPLANT
KIT TURNOVER KIT A (KITS) ×3 IMPLANT
LABEL OR SOLS (LABEL) ×3 IMPLANT
MARKER SKIN DUAL TIP RULER LAB (MISCELLANEOUS) ×3 IMPLANT
PACE AZURE XT DR MRI W1DR01 (Pacemaker) ×3 IMPLANT
PACK PACE INSERTION (MISCELLANEOUS) ×3 IMPLANT
PAD ONESTEP ZOLL R SERIES ADT (MISCELLANEOUS) ×3 IMPLANT
SUT SILK 0 SH 30 (SUTURE) IMPLANT

## 2019-02-24 NOTE — Transfer of Care (Signed)
Immediate Anesthesia Transfer of Care Note  Patient: Vincent Jackson  Procedure(s) Performed: PACEMAKER  GENERATOR CHANGE OUT (Left Chest)  Patient Location: PACU  Anesthesia Type:General  Level of Consciousness: drowsy and patient cooperative  Airway & Oxygen Therapy: Patient Spontanous Breathing  Post-op Assessment: Report given to RN and Post -op Vital signs reviewed and stable  Post vital signs: Reviewed and stable  Last Vitals:  Vitals Value Taken Time  BP 148/70 02/24/19 1310  Temp    Pulse 67 02/24/19 1311  Resp 16 02/24/19 1311  SpO2 94 % 02/24/19 1311  Vitals shown include unvalidated device data.  Last Pain:  Vitals:   02/24/19 1107  TempSrc: Temporal  PainSc: 0-No pain         Complications: No apparent anesthesia complications

## 2019-02-24 NOTE — Discharge Instructions (Addendum)
May remove outer bandage on 02/25/2019. Leave Steri-Strips on. May shower on 02/25/2019.    AMBULATORY SURGERY  DISCHARGE INSTRUCTIONS   1) The drugs that you were given will stay in your system until tomorrow so for the next 24 hours you should not:  A) Drive an automobile B) Make any legal decisions C) Drink any alcoholic beverage   2) You may resume regular meals tomorrow.  Today it is better to start with liquids and gradually work up to solid foods.  You may eat anything you prefer, but it is better to start with liquids, then soup and crackers, and gradually work up to solid foods.   3) Please notify your doctor immediately if you have any unusual bleeding, trouble breathing, redness and pain at the surgery site, drainage, fever, or pain not relieved by medication.    4) Additional Instructions:        Please contact your physician with any problems or Same Day Surgery at [phone], Monday through Friday 6 am to 4 pm, or  at [facility] number at [phone].

## 2019-02-24 NOTE — Anesthesia Preprocedure Evaluation (Signed)
Anesthesia Evaluation  Patient identified by MRN, date of birth, ID band Patient awake    Reviewed: Allergy & Precautions, H&P , NPO status , Patient's Chart, lab work & pertinent test results  History of Anesthesia Complications Negative for: history of anesthetic complications  Airway Mallampati: III  TM Distance: >3 FB Neck ROM: limited    Dental  (+) Poor Dentition, Missing   Pulmonary neg pulmonary ROS, neg shortness of breath, former smoker,           Cardiovascular Exercise Tolerance: Good (-) angina+ CAD, + Peripheral Vascular Disease and +CHF  (-) Past MI and (-) DOE + dysrhythmias + pacemaker      Neuro/Psych negative neurological ROS  negative psych ROS   GI/Hepatic negative GI ROS, Neg liver ROS, neg GERD  ,  Endo/Other  diabetes, Type 2  Renal/GU negative Renal ROS  negative genitourinary   Musculoskeletal   Abdominal   Peds  Hematology negative hematology ROS (+)   Anesthesia Other Findings Past Medical History: No date: CAD (coronary artery disease) No date: Cancer ([place])     Comment:  Bladder cancer  No date: Diabetes mellitus without complication (H[facility]) No date: Diastolic heart failure (H[facility]) No date: Hyperlipidemia No date: Prostate cancer [facility])  Past Surgical History: 2007: BLADDER REMOVAL No date: MASTOIDECTOMY 2007: PROSTATE SURGERY     Comment:  removed  No date: TONSILLECTOMY     Reproductive/Obstetrics negative OB ROS                             Anesthesia Physical Anesthesia Plan  ASA: III  Anesthesia Plan: General   Post-op Pain Management:    Induction: Intravenous  PONV Risk Score and Plan: Propofol infusion and TIVA  Airway Management Planned: Natural Airway and Nasal Cannula  Additional Equipment:   Intra-op Plan:   Post-operative Plan:   Informed Consent: I have reviewed the patients History and Physical, chart, labs and discussed  the procedure including the risks, benefits and alternatives for the proposed anesthesia with the patient or authorized representative who has indicated his/her understanding and acceptance.     Dental Advisory Given  Plan Discussed with: Anesthesiologist, CRNA and Surgeon  Anesthesia Plan Comments: (Patient consented for risks of anesthesia including but not limited to:  - adverse reactions to medications - risk of intubation if required - damage to teeth, lips or other oral mucosa - sore throat or hoarseness - Damage to heart, brain, lungs or loss of life  Patient voiced understanding.)        Anesthesia Quick Evaluation

## 2019-02-24 NOTE — OR Nursing (Signed)
Explanted pacemaker [employer] Adfapta [id number]

## 2019-02-24 NOTE — Anesthesia Postprocedure Evaluation (Signed)
Anesthesia Post Note  Patient: Vincent Jackson  Procedure(s) Performed: PACEMAKER  GENERATOR CHANGE OUT (Left Chest)  Patient location during evaluation: PACU Anesthesia Type: General Level of consciousness: awake and alert Pain management: pain level controlled Vital Signs Assessment: post-procedure vital signs reviewed and stable Respiratory status: spontaneous breathing, nonlabored ventilation, respiratory function stable and patient connected to nasal cannula oxygen Cardiovascular status: blood pressure returned to baseline and stable Postop Assessment: no apparent nausea or vomiting Anesthetic complications: no     Last Vitals:  Vitals:   02/24/19 1347 02/24/19 1358  BP:  (!) 158/91  Pulse: 71 73  Resp: 18 18  Temp:  36.4 C  SpO2: 98% 98%    Last Pain:  Vitals:   02/24/19 1358  TempSrc: Temporal  PainSc: 0-No pain                 Precious Haws Tela Kotecki

## 2019-02-24 NOTE — Op Note (Signed)
[facility]   02/24/2019                     1:12 PM  PATIENT:  [place] DIAGNOSIS:  battery ERI  POST-OPERATIVE DIAGNOSIS:  Same  PROCEDURE:  PACEMAKER  GENERATOR CHANGE OUT  SURGEON:  Isaias Cowman, MD    ANESTHESIA:     PREOPERATIVE INDICATIONS:  Vincent Jackson is a  85 y.o. 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    The risks benefits and alternatives were discussed with the patient preoperatively including but not limited to the risks of infection, bleeding, cardiopulmonary complications, the need for revision surgery, among others, and the patient was willing to proceed.   OPERATIVE PROCEDURE: The patient was brought to the operating room in a fasting state. The left pectoral region was prepped and draped in usual sterile manner. Anesthesia was then 1% lidocaine locally. A 6 cm incision was performed the left pectoral region. The pacemaker indurated was retrieved by electrocautery and blunt dissection. Leads were disconnected and connected to a new MRI compatible dual-chamber rate responsive pacemaker generator  ( Azure XT DR MRI [id number] ). The pacemaker pocket was irrigated with gentamicin solution. The new pacemaker generator was positioned into the pocket and the pocket was closed with 2-0 and 4-0 Vicryl, respectively. Steri-Strips and a pressure dressing were applied. This procedure interrogation revealed appropriate dual-chamber atrial and ventricular sensing and pacing thresholds. There were no periprocedural complications.

## 2019-02-24 NOTE — H&P (Signed)
Jump to Section ? Document InformationECG ResultsEncounter DetailsGoalsImaging ResultsLab ResultsLast Filed Vital SignsMiscellaneous NotesPatient ContactsPatient DemographicsPatient InstructionsPlan of TreatmentProceduresProgress NotesReason for ReferralReason for VisitSocial HistoryVisit Diagnoses [place], generated on Dec. 29, 2020December 29, 2020 Printout Information  Document Contents Document Received Date Document Source Organization  Office Visit Dec. 29, 2020December 29, 2020 [place]   Patient Demographics - 85 y.o. Male; born [birth year]  Patient Address Communication Language Race / Ethnicity Marital Status  [address] [place], [place] [address] [phone] [facility]) [phone] (Mobile) [email or web address] English (Preferred) White / Not Hispanic or Latino Married  Reason for Referral  (Routine) (Routine)  Status Reason Specialty Diagnoses / Procedures Referred By Contact Referred To Contact  Authorized   Diagnoses  Preop testing    Procedures  Prothrombin Time (INR)  Isaias Cowman, MD  [address]  [facility]  [place], [place] [address]  Phone: [phone]  Fax: [phone]          Procedure (Routine) Procedure (Routine)  Status Reason Specialty Diagnoses / Procedures Referred By Contact Referred To Contact  Pending Review   Diagnoses  Preop testing    Procedures  ECG 12-lead  Isaias Cowman, MD  [address]  [facility]  [place], [place] [address]  Phone: [phone]  Fax: [phone]        Reason for Visit  Reason Comments  2 months    Encounter Details  Date Type Department Care Team Description  02/03/2019 Office Visit [facility]  [place], [place] [address]  [phone]  Isaias Cowman, MD  [place]  [facility]  [place], [place] [address]  [phone]  [phone] (Fax)  Essential hypertension (Primary Dx);  Chronic diastolic (congestive) heart failure (CMS-HCC);  Aortic stenosis, mild;  PVD (peripheral vascular disease) (CMS-HCC);  Type 2 diabetes mellitus with peripheral angiopathy (CMS-HCC);  Panlobular emphysema (CMS-HCC);  Diabetic nephropathy associated with type 2 diabetes mellitus (CMS-HCC);  Stage 3a chronic kidney disease;  S/P CABG x 3;  H/O cardiac catheterization;  Hyperlipidemia, mixed;  Preop testing   Social History - documented as of this encounter Tobacco Use Types Packs/Day Years Used Date  Former Smoker Cigarettes 1.5 56 04/03/1936 - 04/03/1992  Smokeless Tobacco: Former Systems developer      Alcohol Use Drinks/Week oz/Week Comments  No 0 Standard drinks or equivalent  0.0    Sex Assigned at [employer] Date Recorded  Not on file    COVID-19 Exposure Response Date Recorded  In the last month, have you been in contact with someone who was confirmed or suspected to have [place] / COVID-19? No / Unsure 02/03/2019 2:03 PM EST   Last Filed Vital Signs - documented in this encounter Vital Sign Reading Time Taken Comments  Blood Pressure 134/62 02/03/2019 2:33 PM EST   Pulse 66 02/03/2019 2:33 PM EST   Temperature - -   Respiratory Rate 15 02/03/2019 2:33 PM EST   Oxygen Saturation 96% 02/03/2019 2:33 PM EST   Inhaled Oxygen Concentration - -   Weight 69.9 kg (154 lb) 02/03/2019 2:33 PM EST   Height 182.9 cm (6') 02/03/2019 2:33 PM EST   Body Mass Index 20.89 02/03/2019 2:33 PM EST    Patient Instructions - documented in this encounter Patient Instructions Isaias Cowman, MD - 02/03/2019 2:15 PM EST   Patient Education    DASH Diet: Care Instructions Your Care Instructions  The DASH diet is an eating  plan that can help lower your blood pressure. DASH stands for Dietary Approaches to Stop Hypertension. Hypertension is high blood  pressure. The DASH diet focuses on eating foods that are high in calcium, potassium, and magnesium. These nutrients can lower blood pressure. The foods that are highest in these nutrients are fruits, vegetables, low-fat dairy products, nuts, seeds, and legumes. But taking calcium, potassium, and magnesium supplements instead of eating foods that are high in those nutrients does not have the same effect. The DASH diet also includes whole grains, fish, and poultry. The DASH diet is one of several lifestyle changes your doctor may recommend to lower your high blood pressure. Your doctor may also want you to decrease the amount of sodium in your diet. Lowering sodium while following the DASH diet can lower blood pressure even further than just the DASH diet alone. Follow-up care is a key part of your treatment and safety. Be sure to make and go to all appointments, and call your doctor if you are having problems. It's also a good idea to know your test results and keep a list of the medicines you take. How can you care for yourself at home? Following the DASH diet  Eat 4 to 5 servings of fruit each day. A serving is 1 medium-sized piece of fruit,  cup chopped or canned fruit, 1/4 cup dried fruit, or 4 ounces ( cup) of fruit juice. Choose fruit more often than fruit juice.  Eat 4 to 5 servings of vegetables each day. A serving is 1 cup of lettuce or raw leafy vegetables,  cup of chopped or cooked vegetables, or 4 ounces ( cup) of vegetable juice. Choose vegetables more often than vegetable juice.  Get 2 to 3 servings of low-fat and fat-free dairy each day. A serving is 8 ounces of milk, 1 cup of yogurt, or 1  ounces of cheese.  Eat 6 to 8 servings of grains each day. A serving is 1 slice of bread, 1 ounce of dry cereal, or  cup of cooked rice, pasta, or cooked cereal. Try to choose whole-grain products as much as possible.  Limit lean meat, poultry, and fish to 2 servings each day. A serving is 3  ounces, about the size of a deck of cards.  Eat 4 to 5 servings of nuts, seeds, and legumes (cooked dried beans, lentils, and split peas) each week. A serving is 1/3 cup of nuts, 2 tablespoons of seeds, or  cup of cooked beans or peas.  Limit fats and oils to 2 to 3 servings each day. A serving is 1 teaspoon of vegetable oil or 2 tablespoons of salad dressing.  Limit sweets and added sugars to 5 servings or less a week. A serving is 1 tablespoon jelly or jam,  cup sorbet, or 1 cup of lemonade.  Eat less than 2,300 milligrams (mg) of sodium a day. If you limit your sodium to 1,500 mg a day, you can lower your blood pressure even more.  Be aware that all of these are the suggested number of servings for people who eat 1,800 to 2,000 calories a day. Your recommended number of servings may be different if you need more or fewer calories. Tips for success  Start small. Do not try to make dramatic changes to your diet all at once. You might feel that you are missing out on your favorite foods and then be more likely to not follow the plan. Make small changes, and stick with them. Once  those changes become habit, add a few more changes.  Try some of the following: ? Make it a goal to eat a fruit or vegetable at every meal and at snacks. This will make it easy to get the recommended amount of fruits and vegetables each day. ? Try yogurt topped with fruit and nuts for a snack or healthy dessert. ? Add lettuce, tomato, cucumber, and onion to sandwiches. ? Combine a ready-made pizza crust with low-fat mozzarella cheese and lots of vegetable toppings. Try using tomatoes, squash, spinach, broccoli, carrots, cauliflower, and onions. ? Have a variety of cut-up vegetables with a low-fat dip as an appetizer instead of chips and dip. ? Sprinkle sunflower seeds or chopped almonds over salads. Or try adding chopped walnuts or almonds to cooked vegetables. ? Try some vegetarian meals using beans and peas. Add  garbanzo or kidney beans to salads. Make burritos and tacos with mashed pinto beans or black beans. Where can you learn more? Log in to your [facility] MyChart account at [email or web address] and click on top menu option "Health" then select "Search Medical Library". Enter [phone] in the search box and click the magnify glass to learn more about "DASH Diet: Care Instructions." Current as of: December 16, 2019Content Version: 12.6  2006-2020 Healthwise, Incorporated.  Care instructions adapted under license by your healthcare professional. If you have questions about a medical condition or this instruction, always ask your healthcare professional. [place] any warranty or liability for your use of this information.    Patient Education    Learning About High Cholesterol What is high cholesterol?  High cholesterol means that you have too much cholesterol in your blood. Cholesterol is a type of fat. It's needed for many body functions, such as making new cells. Cholesterol is made by your body. It also comes from food you eat. Having high cholesterol can lead to the buildup of plaque in artery walls. This can increase your risk of heart disease and stroke. When your doctor talks about high cholesterol levels, he or she is talking about your total cholesterol and LDL cholesterol (the "bad" cholesterol) levels. Your doctor may also speak about HDL (the "good" cholesterol) levels. High HDL is linked with a lower risk for heart disease, heart attack, and stroke. Your cholesterol levels help your doctor find out your risk for having a heart attack or stroke. How can you prevent high cholesterol? A heart-healthy lifestyle can help you prevent high cholesterol. This lifestyle helps lower your risk for a heart attack and stroke.  Eat heart-healthy foods. ? Eat fruits, vegetables, whole grains (like oatmeal), dried beans and peas, nuts and seeds, soy products (like  tofu), and fat-free or low-fat dairy products. ? Replace butter, margarine, and hydrogenated or partially hydrogenated oils with olive and canola oils. (Canola oil margarine without trans fat is fine.) ? Replace red meat with fish, poultry, and soy protein (like tofu). ? Limit processed and packaged foods like chips, crackers, and cookies.  Be active. Exercise can improve your cholesterol level. Get at least 30 minutes of exercise on most days of the week. Walking is a good choice. You also may want to do other activities, such as running, swimming, cycling, or playing tennis or team sports.  Stay at a healthy weight. Lose weight if you need to.  Don't smoke. If you need help quitting, talk to your doctor about stop-smoking programs and medicines. These can increase your chances of quitting for good. How is high cholesterol treated? The  goal of treatment is to reduce your chances of having a heart attack or stroke. The goal is not to lower your cholesterol numbers only.  You may make lifestyle changes, such as eating healthy foods, not smoking, losing weight, and being more active.  You may have to take medicine. Follow-up care is a key part of your treatment and safety. Be sure to make and go to all appointments, and call your doctor if you are having problems. It's also a good idea to know your test results and keep a list of the medicines you take. Where can you learn more? Log in to your [facility] MyChart account at [email or web address] and click on top menu option "Health" then select "Search Medical Library". Enter [phone] in the search box and click the magnify glass to learn more about "Learning About High Cholesterol." Current as of: December 16, 2019Content Version: 12.6  2006-2020 Healthwise, Incorporated.  Care instructions adapted under license by your healthcare professional. If you have questions about a medical condition or this instruction, always ask your  healthcare professional. [place] any warranty or liability for your use of this information.    Patient Education    Learning About Meal Planning for Diabetes Why plan your meals?  Meal planning can be a key part of managing diabetes. Planning meals and snacks with the right balance of carbohydrate, protein, and fat can help you keep your blood sugar at the target level you set with your doctor. You don't have to eat special foods. You can eat what your family eats, including sweets once in a while. But you do have to pay attention to how often you eat and how much you eat of certain foods. You may want to work with a dietitian or a certified diabetes educator. He or she can give you tips and meal ideas and can answer your questions about meal planning. This health professional can also help you reach a healthy weight if that is one of your goals. What plan is right for you? Your dietitian or diabetes educator may suggest that you start with the plate format or carbohydrate counting. The plate format The plate format is a simple way to help you manage how you eat. You plan meals by learning how much space each food should take on a plate. Using the plate format helps you spread carbohydrate throughout the day. It can make it easier to keep your blood sugar level within your target range. It also helps you see if you're eating healthy portion sizes. To use the plate format, you put non-starchy vegetables on half your plate. Add meat or meat substitutes on one-quarter of the plate. Put a grain or starchy vegetable (such as brown rice or a potato) on the final quarter of the plate. You can add a small piece of fruit and some low-fat or fat-free milk or yogurt, depending on your carbohydrate goal for each meal. Here are some tips for using the plate format:  Make sure that you are not using an oversized plate. A 9-inch plate is best. Many restaurants use larger plates.   Get used to using the plate format at home. Then you can use it when you eat out.  Write down your questions about using the plate format. Talk to your doctor, a dietitian, or a diabetes educator about your concerns. Carbohydrate counting With carbohydrate counting, you plan meals based on the amount of carbohydrate in each food. Carbohydrate raises blood sugar higher and more quickly  than any other nutrient. It is found in desserts, breads and cereals, and fruit. It's also found in starchy vegetables such as potatoes and corn, grains such as rice and pasta, and milk and yogurt. Spreading carbohydrate throughout the day helps keep your blood sugar levels within your target range. Your daily amount depends on several things, including your weight, how active you are, which diabetes medicines you take, and what your goals are for your blood sugar levels. A registered dietitian or diabetes educator can help you plan how much carbohydrate to include in each meal and snack. A guideline for your daily amount of carbohydrate is:  45 to 60 grams at each meal. That's about the same as 3 to 4 carbohydrate servings.  15 to 20 grams at each snack. That's about the same as 1 carbohydrate serving. The Nutrition Facts label on packaged foods tells you how much carbohydrate is in a serving of the food. First, look at the serving size on the food label. Is that the amount you eat in a serving? All of the nutrition information on a food label is based on that serving size. So if you eat more or less than that, you'll need to adjust the other numbers. Total carbohydrate is the next thing you need to look for on the label. If you count carbohydrate servings, one serving of carbohydrate is 15 grams. For foods that don't come with labels, such as fresh fruits and vegetables, you'll need a guide that lists carbohydrate in these foods. Ask your doctor, dietitian, or diabetes educator about books or other nutrition guides you  can use. If you take insulin, you need to know how many grams of carbohydrate are in a meal. This lets you know how much rapid-acting insulin to take before you eat. If you use an insulin pump, you get a constant rate of insulin during the day. So the pump must be programmed at meals to give you extra insulin to cover the rise in blood sugar after meals. When you know how much carbohydrate you will eat, you can take the right amount of insulin. Or, if you always use the same amount of insulin, you need to make sure that you eat the same amount of carbohydrate at meals. If you need more help to understand carbohydrate counting and food labels, ask your doctor, dietitian, or diabetes educator. How can you plan healthy meals? Here are some tips to get started:  Plan your meals a week at a time. Don't forget to include snacks too.  Use cookbooks or online recipes to plan several main meals. Plan some quick meals for busy nights. You also can double some recipes that freeze well. Then you can save half for other busy nights when you don't have time to cook.  Make sure you have the ingredients you need for your recipes. If you're running low on basic items, put these items on your shopping list too.  List foods that you use to make breakfasts, lunches, and snacks. List plenty of fruits and vegetables.  Post this list on the refrigerator. Add to it as you think of more things you need.  Take the list to the store to do your weekly shopping. Follow-up care is a key part of your treatment and safety. Be sure to make and go to all appointments, and call your doctor if you are having problems. It's also a good idea to know your test results and keep a list of the medicines you take.  Where can you learn more? Log in to your [facility] MyChart account at [email or web address] and click on top menu option "Health" then select "Search Medical Library". Enter [phone] in the search box and click the magnify glass  to learn more about "Learning About Meal Planning for Diabetes." Current as of: December 20, 2019Content Version: 12.6  2006-2020 Healthwise, Incorporated.  Care instructions adapted under license by your healthcare professional. If you have questions about a medical condition or this instruction, always ask your healthcare professional. [place] any warranty or liability for your use of this information.     Electronically signed by Isaias Cowman, MD at 02/03/2019 2:42 PM EST     Progress Notes - documented in this encounter Isaias Cowman, MD - 02/03/2019 2:15 PM EST Formatting of this note might be different from the original. Established Patient Visit   Chief Complaint: Chief Complaint  Patient presents with  . 2 months  Date of Service: 02/03/2019 Date of Birth: 02/05/1920 PCP: Yevonne Pax, MD  History of Present Illness: Mr. Bouillon is a 85 y.o.male patient who returns for  1. CABG x3 1998 2. Stent distal RCA 05/26/1999 3. Cypher stent SVG to ramus November, 2004 4. Cypher stent ramus and RCA 08/09/2003 5. PTCA in stent restenoses RCA 04/19/2009 6. Dual-chamber pacemaker for sick sinus and 7. Essential hypertension 8. Hyperlipidemia 9. Type 2 diabetes 10. Diastolic congestive heart 11. COPD 46. Chronic kidney disease 13. Moderate aortic stenosis, tricuspid and mitral regurgitation  The patient returns today for follow-up, reports "doing well". He denies chest pain or shortness of breath. He denies experiencing palpitations or heart racing. He denies presyncope or syncope. He denies peripheral edema. He currently is a resident at the [facility]. He ambulates with a walker, and reports that he takes walks up and down the hallways and outdoors regularly. 2D echocardiogram 10/25/2016 revealed LVEF of 50%, with moderate mitral regurgitation, and mild aortic stenosis with calculated aortic valve area 1.9  cm square with peak gradient of 21.2 mmHg, and mean gradient of 10 mmHg. Pacemaker interrogation earlier today revealed normally functioning dual-chamber pacemaker with underlying complete heart block, with longevity of 1 month.  The patient has essential hypertension, blood pressure well controlled, currently on metoprolol succinate and furosemide, which is well tolerated without apparent side effects. The patient follows a low-sodium, no added salt diet.  The patient has hyperlipidemia, LDL cholesterol is 112 on 09/04/17, on simvastatin, which is well tolerated without apparent side effects, followed by his primary care provider. The patient follows a low-cholesterol, low-fat diet.  The patient has type 2 diabetes, hemoglobin A1c was 7.4 on 09/03/2017 currently on glipizide, which is well tolerated without apparent side effects, followed by his primary care provider. The patient follows a low carbohydrate, ADA diet.  Past Medical and Surgical History  Past Medical History Past Medical History:  Diagnosis Date  . COAG (chronic open-angle glaucoma) OU 04/03/2012  . COPD (chronic obstructive pulmonary disease) (CMS-HCC)  . Coronary artery disease  . DM2 (diabetes mellitus, type 2) (CMS-HCC) 04/03/2012  DIABETES ~ 1983  . Elevated cholesterol 04/03/2012  . ERM OS (epiretinal membrane, left eye) 04/03/2012  . H/O prostate cancer 2005  . History of bladder cancer 2005  . HTN (hypertension) 04/03/2012  . 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 PVD (posterior vitreous detachment), both eyes 04/03/2012   Past Surgical History He has a past surgical history that includes lens eye surgery; Prostatectomy Perineal (2005); REMOVAL OF  BLADDER (2005); PACEMAKER SX; Cardiac catheterization; Insert / replace / remove pacemaker; Coronary artery bypass graft; and Prostate surgery.   Medications and Allergies  Current Medications  Current Outpatient Medications  Medication Sig Dispense Refill  . aspirin 81  MG EC tablet Take 81 mg by mouth once daily.  Marland Kitchen glipiZIDE (GLUCOTROL XL) 10 MG XL tablet Take 1 tablet (10 mg total) by mouth 2 (two) times daily 180 tablet 3  . latanoprost (XALATAN) 0.005 % ophthalmic solution ONE DROP IN BOTH EYES AT BEDTIME 2.5 mL 11  . metoprolol succinate (TOPROL-XL) 25 MG XL tablet Take 1 tablet (25 mg total) by mouth once daily 90 tablet 3  . multivitamin tablet Take 1 tablet by mouth once daily.  . pantoprazole (PROTONIX) 40 MG DR tablet Take 1 tablet (40 mg total) by mouth once daily 90 tablet 3  . potassium chloride (KLOR-CON) 10 mEq ER tablet Take 1 tablet (10 mEq total) by mouth once daily 90 tablet 3  . simvastatin (ZOCOR) 40 MG tablet Take 1 tablet (40 mg total) by mouth every morning 90 tablet 3  . cyanocobalamin, vitamin B-12, 500 mcg Subl Place 500 mcg under the tongue once daily. (Patient taking differently: Place 500 mcg under the tongue once daily When remembers ) 30 tablet 5  . FUROsemide (LASIX) 20 MG tablet Take 20 mg by mouth once daily.  30 tablet 11   No current facility-administered medications for this visit.   Allergies: Patient has no known allergies.  Social and Family History  Social History reports that he quit smoking about 26 years ago. His smoking use included cigarettes. He started smoking about 82 years ago. He has a 84.00 pack-year smoking history. He has quit using smokeless tobacco. He reports that he does not drink alcohol or use drugs.  Family History Family History  Problem Relation Age of Onset  . Cataracts Brother  . Diabetes Paternal Grandfather  . Glaucoma Neg Hx  . Macular degeneration Neg Hx  . Retinal degeneration Neg Hx  . Blindness Neg Hx  . High blood pressure (Hypertension) Neg Hx   Review of Systems   Review of Systems: The patient denies chest pain, shortness of breath, orthopnea, paroxysmal nocturnal dyspnea, reports mild pedal edema, palpitations, heart racing, presyncope, syncope. Review of 10 Systems is  negative except as described above.  Physical Examination   Vitals: BP 134/62 (BP Location: Left upper arm, Patient Position: Sitting, BP Cuff Size: Adult)  Pulse 66  Resp 15  Ht 182.9 cm (6')  Wt 69.9 kg (154 lb)  SpO2 96%  BMI 20.89 kg/m  Ht:182.9 cm (6') Wt:69.9 kg (154 lb) HAL:PFXT surface area is 1.88 meters squared. Body mass index is 20.89 kg/m.  General: Alert and oriented. Well-appearing. No acute distress. HEENT: Pupils equally reactive to light and accomodation  Neck: Supple, no JVD Lungs: Normal effort of breathing; clear to auscultation bilaterally; no wheezes, rales, rhonchi Heart: Regular rate and rhythm. No murmur, rub, or gallop Abdomen: nondistended, with normal bowel sounds Extremities: Trace bilateral lower extremity edema Peripheral Pulses: 2+ radial  Skin: Warm, dry, no diaphoresis  Assessment   85 y.o. male with  1. Essential hypertension  2. Chronic diastolic (congestive) heart failure (CMS-HCC)  3. Aortic stenosis, mild  4. PVD (peripheral vascular disease) (CMS-HCC)  5. Type 2 diabetes mellitus with peripheral angiopathy (CMS-HCC)  6. Panlobular emphysema (CMS-HCC)  7. Diabetic nephropathy associated with type 2 diabetes mellitus (CMS-HCC)  8. Stage 3a chronic kidney disease  9. S/P CABG x 3  10. H/O cardiac catheterization  11. Hyperlipidemia, mixed   85 year old gentleman with complex coronary artery disease as described above, currently without chest pain. The patient has essential hypertension, blood pressure well controlled on current BP medications. The patient has adequate control of LDL cholesterol on simvastatin. 2D echocardiogram revealed preserved left ventricular function, with LVEF of 50%, with moderate mitral regurgitation, and mild aortic stenosis. Patient has dual-chamber pacemaker, with longevity 1 month.  Plan   1. Continue current medications 2. Counseled patient about low-sodium diet 3. DASH diet printed instructions given  to the patient 4. Counseled patient about low-cholesterol diet 5. Continue simvastatin for hyperlipidemia management  6. Heart healthy diet printed instructions given to the patient 7. Pacemaker generator change out. The risk, benefits and alternatives of pacemaker generator change out were explained to the patient informed written consent was obtained.  No orders of the defined types were placed in this encounter.  Return in about 2 weeks (around 02/17/2019), or after pacemaker change-out.  Isaias Cowman, MD PhD [facility]  Electronically signed by Isaias Cowman, MD at 02/03/2019 2:45 PM EST   Miscellaneous Notes - documented in this encounter Table of Contents for Miscellaneous Notes  Addendum Note - Richardo Priest, RN - 02/03/2019 2:15 PM EST  Addendum Note - Richardo Priest, RN - 02/03/2019 2:15 PM EST    Addendum Note - Richardo Priest, RN - 02/03/2019 2:15 PM EST Addended by: Sherri Sear on: 02/03/2019 02:55 PM  Modules accepted: Orders    Electronically signed by Richardo Priest, RN at 02/03/2019 2:55 PM EST  Back to top of Miscellaneous Notes Addendum Note - Richardo Priest, RN - 02/03/2019 2:15 PM EST Addended by: Sherri Sear on: 02/03/2019 02:54 PM  Modules accepted: Orders    Electronically signed by Richardo Priest, RN at 02/03/2019 2:54 PM EST  Back to top of Miscellaneous Notes  Plan of Treatment - documented as of this encounter Upcoming Encounters Upcoming Encounters  Date Type Specialty Care Team Description  07/07/2019 Ancillary Orders Lab Yevonne Pax, MD  [place]  [facility] [place], [place] [address]  [phone]  [phone] (Fax)    07/07/2019 Office Visit Internal Medicine Yevonne Pax, MD  [place]  [facility] [place]  [place], [place] [address]  [phone]  [phone] (Fax)     Scheduled  Orders Scheduled Orders  Name Type Priority Associated Diagnoses Order Schedule  2019 Novel Coronavirus (CoVID-19), NAA - [facility] Routine Preop testing  Expected: 02/17/2019, Expires: 02/03/2020   Goals - documented as of this encounter Goal Patient Goal Type Associated Problems Recent Progress Patient-Stated? Author  Maintain health/healthy lifestyle  Lifestyle  On track (09/10/2017 10:27 AM EDT) Yes Peggye Form, RN   Procedures - documented in this encounter Procedure Name Priority Date/Time Associated Diagnosis Comments  PR ELECTROCARDIOGRAM, COMPLETE Routine 02/03/2019 2:57 PM EST Preop testing  Results for this procedure are in the results section.    Lab Results - documented in this encounter Table of Contents for Lab Results  Prothrombin Time (INR) (02/03/2019 3:36 PM EST)  Basic Metabolic Panel (BMP) (58/10/9831 3:36 PM EST)  Complete Blood Count (CBC) (02/03/2019 3:36 PM EST)     Prothrombin Time (INR) (02/03/2019 3:36 PM EST) Prothrombin Time (INR) (02/03/2019 3:36 PM EST)  Component Value Ref Range Performed At Pathologist Signature  Prothrombin Time 12.0 10.0 - 13.2 Sec B and E  Prothrombin INR 1.0 (L) 2.0 - 3.0 [place] - LAB    Prothrombin Time (INR) (02/03/2019 3:36 PM EST)  Specimen  Blood   Prothrombin Time (INR) (02/03/2019 3:36 PM EST)  Narrative Performed At  Patients on stable oral anticoagulant therapy, the target therapeutic range for INR is 2.0-3.0 in most cases.    Patients with prosthetic heart valves, the range is 2.5-3.5  [facility] - LAB    Prothrombin Time (INR) (02/03/2019 3:36 PM EST)  Performing Organization Address City/State/Zipcode Phone Number  [place]  [place], [place] [phone]     Back to top of Lab Results    Basic Metabolic Panel (BMP) (61/44/3154 3:36 PM EST) Basic Metabolic Panel (BMP) (00/86/7619 3:36 PM EST)    Component Value Ref Range Performed At Pathologist Signature  Glucose 116 (H) 70 - 110 mg/dL [facility] - LAB   Sodium 141 136 - 145 mmol/L [facility] - LAB   Potassium 5.2 (H) 3.6 - 5.1 mmol/L [facility] - LAB   Chloride 108 97 - 109 mmol/L [facility] - LAB   Carbon Dioxide (CO2) 30.9 22.0 - 32.0 mmol/L [facility] - LAB   Calcium 8.9 8.7 - 10.3 mg/dL [facility] - LAB   Urea Nitrogen (BUN) 31 (H) 7 - 25 mg/dL [facility] - LAB   Creatinine 1.6 (H) 0.7 - 1.3 mg/dL [place] - LAB   Glomerular Filtration Rate (eGFR), MDRD Estimate 40 (L) >60 mL/min/1.73sq m [place] - LAB   BUN/Crea Ratio 19.4 6.0 - 20.0 [facility] - LAB   Anion Gap w/K 7.3 6.0 - 16.0 [place] - LAB    Basic Metabolic Panel (BMP) (50/93/2671 3:36 PM EST)  Specimen  Blood   Basic Metabolic Panel (BMP) (24/58/0998 3:36 PM EST)  Performing Organization Address City/State/Zipcode Phone Number  [place]  [place], [place] [phone]     Back to top of Lab Results    Complete Blood Count (CBC) (02/03/2019 3:36 PM EST) Complete Blood Count (CBC) (02/03/2019 3:36 PM EST)  Component Value Ref Range Performed At Pathologist Signature  WBC (White Blood Cell Count) 5.7 4.1 - 10.2 10^3/uL [facility] - LAB   RBC (Red Blood Cell Count) 3.50 (L) 4.69 - 6.13 10^6/uL [facility] - LAB   Hemoglobin 10.8 (L) 14.1 - 18.1 gm/dL [facility] - LAB   Hematocrit 34.3 (L) 40.0 - 52.0 % [facility] - LAB   MCV (Mean Corpuscular Volume) 98.0 80.0 - 100.0 fl [facility] - LAB   MCH (Mean Corpuscular Hemoglobin) 30.9 27.0 - 31.2 pg [facility] - LAB   MCHC (Mean Corpuscular Hemoglobin Concentration) 31.5 (L) 32.0 - 36.0 gm/dL [facility] - LAB   Platelet Count 178 150 - 450 10^3/uL [facility] - LAB    RDW-CV (Red Cell Distribution Width) 13.4 11.6 - 14.8 % [facility] - LAB   MPV (Mean Platelet Volume) 8.8 (L) 9.4 - 12.4 fl [facility] - LAB    Complete Blood Count (CBC) (02/03/2019 3:36 PM EST)  Specimen  Blood   Complete Blood Count (CBC) (02/03/2019 3:36 PM EST)  Performing Organization Address City/State/Zipcode Phone Number  [facility] - LAB  [place], [place] [phone]  Back to top of Lab Results  Imaging Results - documented in this encounter  X-ray chest PA and lateral (02/03/2019 3:28 PM EST) X-ray chest PA and lateral (02/03/2019 3:28 PM EST)  Specimen     X-ray chest PA and lateral (02/03/2019 3:28 PM EST)  Narrative Performed At  This result has an attachment that is not available.        ECG Results - documented in this encounter  ECG 12-lead (02/03/2019 2:57 PM EST) ECG 12-lead (02/03/2019 2:57 PM EST)  Component Value Ref Range Performed At Pathologist Signature  Vent Rate (bpm) 67  [facility] GE MUSE RESULTS   PR Interval (msec) 174  [facility] GE MUSE RESULTS   QRS Interval (msec) 170  [facility] GE MUSE RESULTS   QT Interval (msec) 438  [facility] GE MUSE RESULTS   QTc (msec) 462  [facility] GE MUSE RESULTS    ECG 12-lead (02/03/2019 2:57 PM EST)  Specimen     ECG 12-lead (02/03/2019 2:57 PM EST)  Narrative Performed At  This result has an attachment that is not available.  Atrioventricular dual-paced rhythm Abnormal ECG When compared with ECG of 17-Jun-2014 14:33, Vent. rate has decreased BY  4 BPM I reviewed and concur with this report. Electronically signed QU:[email or web address], MD, Cristie Hem ([id number]) on 02/13/2019 12:19:49 PM   [place]    ECG 12-lead (02/03/2019 2:57 PM EST)  Performing Organization Address City/State/Zipcode Phone Number  [place]        Visit Diagnoses - documented in this encounter Diagnosis  Essential hypertension - Primary   Chronic diastolic  (congestive) heart failure (CMS-HCC)   Aortic stenosis, mild   PVD (peripheral vascular disease) (CMS-HCC)  Unspecified peripheral vascular disease   Type 2 diabetes mellitus with peripheral angiopathy (CMS-HCC)   Panlobular emphysema (CMS-HCC)  Other emphysema   Diabetic nephropathy associated with type 2 diabetes mellitus (CMS-HCC)   Stage 3a chronic kidney disease   S/P CABG x 3  Postsurgical aortocoronary bypass status   H/O cardiac catheterization   Hyperlipidemia, mixed  Mixed hyperlipidemia   Preop testing  Unspecified pre-operative examination   Images  Patient Contacts  Contact Name Contact Address Communication Relationship to Patient  Briscoe Burns Unknown [phone] [place]) Spouse, Emergency Contact  Document Information  Primary Care Provider Other Service Providers Document Coverage Dates  Yevonne Pax, MD (Jun. 08, 2018June 08, 2018 - Present) [phone] (Work) [phone] (Fax) [place] [facility] [place], [place] [address] Internal Medicine [facility] [place], [place] [address]  Dec. 08, 2020December 08, [place] [address] [place], [place] [address]   Encounter Providers Encounter Date  Isaias Cowman, MD (Attending) [phone] (Work) [phone] (Fax) [facility] [place], [place] [address] Cardiovascular Disease Dec. 08, 2020December 08, 2020 - Dec. 12, 2020December 12, 2020    Show All Sections

## 2019-02-24 NOTE — Anesthesia Post-op Follow-up Note (Signed)
Anesthesia QCDR form completed.        

## 2019-03-03 ENCOUNTER — Encounter: Payer: Self-pay | Admitting: *Deleted

## 2019-03-12 DIAGNOSIS — Z9889 Other specified postprocedural states: Secondary | ICD-10-CM | POA: Diagnosis not present

## 2019-03-12 DIAGNOSIS — J431 Panlobular emphysema: Secondary | ICD-10-CM | POA: Diagnosis not present

## 2019-03-12 DIAGNOSIS — I5032 Chronic diastolic (congestive) heart failure: Secondary | ICD-10-CM | POA: Diagnosis not present

## 2019-03-12 DIAGNOSIS — E1151 Type 2 diabetes mellitus with diabetic peripheral angiopathy without gangrene: Secondary | ICD-10-CM | POA: Diagnosis not present

## 2019-03-12 DIAGNOSIS — E1121 Type 2 diabetes mellitus with diabetic nephropathy: Secondary | ICD-10-CM | POA: Diagnosis not present

## 2019-03-12 DIAGNOSIS — I10 Essential (primary) hypertension: Secondary | ICD-10-CM | POA: Diagnosis not present

## 2019-03-12 DIAGNOSIS — I739 Peripheral vascular disease, unspecified: Secondary | ICD-10-CM | POA: Diagnosis not present

## 2019-03-12 DIAGNOSIS — I350 Nonrheumatic aortic (valve) stenosis: Secondary | ICD-10-CM | POA: Diagnosis not present

## 2019-03-12 DIAGNOSIS — Z951 Presence of aortocoronary bypass graft: Secondary | ICD-10-CM | POA: Diagnosis not present

## 2019-06-24 DIAGNOSIS — Z9889 Other specified postprocedural states: Secondary | ICD-10-CM | POA: Diagnosis not present

## 2019-06-24 DIAGNOSIS — I5032 Chronic diastolic (congestive) heart failure: Secondary | ICD-10-CM | POA: Diagnosis not present

## 2019-06-24 DIAGNOSIS — J431 Panlobular emphysema: Secondary | ICD-10-CM | POA: Diagnosis not present

## 2019-06-24 DIAGNOSIS — Z950 Presence of cardiac pacemaker: Secondary | ICD-10-CM | POA: Diagnosis not present

## 2019-06-24 DIAGNOSIS — I10 Essential (primary) hypertension: Secondary | ICD-10-CM | POA: Diagnosis not present

## 2019-06-24 DIAGNOSIS — Z951 Presence of aortocoronary bypass graft: Secondary | ICD-10-CM | POA: Diagnosis not present

## 2019-06-24 DIAGNOSIS — E1151 Type 2 diabetes mellitus with diabetic peripheral angiopathy without gangrene: Secondary | ICD-10-CM | POA: Diagnosis not present

## 2019-06-24 DIAGNOSIS — I442 Atrioventricular block, complete: Secondary | ICD-10-CM | POA: Diagnosis not present

## 2019-06-24 DIAGNOSIS — I350 Nonrheumatic aortic (valve) stenosis: Secondary | ICD-10-CM | POA: Diagnosis not present

## 2019-06-24 DIAGNOSIS — E782 Mixed hyperlipidemia: Secondary | ICD-10-CM | POA: Diagnosis not present

## 2019-07-30 DIAGNOSIS — E1121 Type 2 diabetes mellitus with diabetic nephropathy: Secondary | ICD-10-CM | POA: Diagnosis not present

## 2019-07-30 DIAGNOSIS — E1151 Type 2 diabetes mellitus with diabetic peripheral angiopathy without gangrene: Secondary | ICD-10-CM | POA: Diagnosis not present

## 2019-07-30 DIAGNOSIS — J431 Panlobular emphysema: Secondary | ICD-10-CM | POA: Diagnosis not present

## 2019-07-30 DIAGNOSIS — E782 Mixed hyperlipidemia: Secondary | ICD-10-CM | POA: Diagnosis not present

## 2019-07-30 DIAGNOSIS — N1832 Chronic kidney disease, stage 3b: Secondary | ICD-10-CM | POA: Diagnosis not present

## 2019-07-30 DIAGNOSIS — E538 Deficiency of other specified B group vitamins: Secondary | ICD-10-CM | POA: Diagnosis not present

## 2019-07-30 DIAGNOSIS — Z0000 Encounter for general adult medical examination without abnormal findings: Secondary | ICD-10-CM | POA: Diagnosis not present

## 2019-07-30 DIAGNOSIS — I739 Peripheral vascular disease, unspecified: Secondary | ICD-10-CM | POA: Diagnosis not present

## 2019-07-30 DIAGNOSIS — I5032 Chronic diastolic (congestive) heart failure: Secondary | ICD-10-CM | POA: Diagnosis not present

## 2019-12-15 DIAGNOSIS — I739 Peripheral vascular disease, unspecified: Secondary | ICD-10-CM | POA: Diagnosis not present

## 2019-12-15 DIAGNOSIS — E1151 Type 2 diabetes mellitus with diabetic peripheral angiopathy without gangrene: Secondary | ICD-10-CM | POA: Diagnosis not present

## 2019-12-15 DIAGNOSIS — Z23 Encounter for immunization: Secondary | ICD-10-CM | POA: Diagnosis not present

## 2019-12-15 DIAGNOSIS — N1832 Chronic kidney disease, stage 3b: Secondary | ICD-10-CM | POA: Diagnosis not present

## 2019-12-15 DIAGNOSIS — E1121 Type 2 diabetes mellitus with diabetic nephropathy: Secondary | ICD-10-CM | POA: Diagnosis not present

## 2019-12-15 DIAGNOSIS — I10 Essential (primary) hypertension: Secondary | ICD-10-CM | POA: Diagnosis not present

## 2019-12-15 DIAGNOSIS — I350 Nonrheumatic aortic (valve) stenosis: Secondary | ICD-10-CM | POA: Diagnosis not present

## 2019-12-15 DIAGNOSIS — I5032 Chronic diastolic (congestive) heart failure: Secondary | ICD-10-CM | POA: Diagnosis not present

## 2019-12-15 DIAGNOSIS — J431 Panlobular emphysema: Secondary | ICD-10-CM | POA: Diagnosis not present

## 2019-12-18 ENCOUNTER — Other Ambulatory Visit: Payer: Self-pay

## 2019-12-18 ENCOUNTER — Encounter: Payer: Self-pay | Admitting: Emergency Medicine

## 2019-12-18 ENCOUNTER — Emergency Department: Payer: Medicare HMO

## 2019-12-18 ENCOUNTER — Inpatient Hospital Stay
Admission: EM | Admit: 2019-12-18 | Discharge: 2019-12-18 | DRG: 184 | Disposition: A | Discharge status: Other Acute Inpatient Hospital | Payer: Medicare HMO | Attending: Internal Medicine | Admitting: Internal Medicine

## 2019-12-18 ENCOUNTER — Inpatient Hospital Stay (HOSPITAL_COMMUNITY)
Admission: AD | Admit: 2019-12-18 | Discharge: 2019-12-22 | DRG: 184 | Disposition: A | Discharge status: Skilled Nursing Facility | Payer: Medicare HMO | Source: Other Acute Inpatient Hospital | Attending: Internal Medicine | Admitting: Internal Medicine

## 2019-12-18 DIAGNOSIS — Z950 Presence of cardiac pacemaker: Secondary | ICD-10-CM

## 2019-12-18 DIAGNOSIS — I350 Nonrheumatic aortic (valve) stenosis: Secondary | ICD-10-CM | POA: Diagnosis present

## 2019-12-18 DIAGNOSIS — I495 Sick sinus syndrome: Secondary | ICD-10-CM | POA: Diagnosis present

## 2019-12-18 DIAGNOSIS — Z951 Presence of aortocoronary bypass graft: Secondary | ICD-10-CM

## 2019-12-18 DIAGNOSIS — Z79899 Other long term (current) drug therapy: Secondary | ICD-10-CM

## 2019-12-18 DIAGNOSIS — R296 Repeated falls: Secondary | ICD-10-CM | POA: Diagnosis present

## 2019-12-18 DIAGNOSIS — I2510 Atherosclerotic heart disease of native coronary artery without angina pectoris: Secondary | ICD-10-CM | POA: Diagnosis not present

## 2019-12-18 DIAGNOSIS — S225XXA Flail chest, initial encounter for closed fracture: Principal | ICD-10-CM | POA: Diagnosis present

## 2019-12-18 DIAGNOSIS — I442 Atrioventricular block, complete: Secondary | ICD-10-CM | POA: Diagnosis not present

## 2019-12-18 DIAGNOSIS — S2249XA Multiple fractures of ribs, unspecified side, initial encounter for closed fracture: Secondary | ICD-10-CM | POA: Diagnosis not present

## 2019-12-18 DIAGNOSIS — M2550 Pain in unspecified joint: Secondary | ICD-10-CM | POA: Diagnosis not present

## 2019-12-18 DIAGNOSIS — I959 Hypotension, unspecified: Secondary | ICD-10-CM | POA: Diagnosis not present

## 2019-12-18 DIAGNOSIS — Z8546 Personal history of malignant neoplasm of prostate: Secondary | ICD-10-CM | POA: Diagnosis not present

## 2019-12-18 DIAGNOSIS — W010XXA Fall on same level from slipping, tripping and stumbling without subsequent striking against object, initial encounter: Secondary | ICD-10-CM | POA: Diagnosis present

## 2019-12-18 DIAGNOSIS — J90 Pleural effusion, not elsewhere classified: Secondary | ICD-10-CM | POA: Diagnosis not present

## 2019-12-18 DIAGNOSIS — E1122 Type 2 diabetes mellitus with diabetic chronic kidney disease: Secondary | ICD-10-CM | POA: Diagnosis present

## 2019-12-18 DIAGNOSIS — M6281 Muscle weakness (generalized): Secondary | ICD-10-CM | POA: Diagnosis not present

## 2019-12-18 DIAGNOSIS — W19XXXA Unspecified fall, initial encounter: Secondary | ICD-10-CM

## 2019-12-18 DIAGNOSIS — I129 Hypertensive chronic kidney disease with stage 1 through stage 4 chronic kidney disease, or unspecified chronic kidney disease: Secondary | ICD-10-CM | POA: Diagnosis present

## 2019-12-18 DIAGNOSIS — Y92009 Unspecified place in unspecified non-institutional (private) residence as the place of occurrence of the external cause: Secondary | ICD-10-CM

## 2019-12-18 DIAGNOSIS — Z87891 Personal history of nicotine dependence: Secondary | ICD-10-CM | POA: Diagnosis not present

## 2019-12-18 DIAGNOSIS — Z7401 Bed confinement status: Secondary | ICD-10-CM | POA: Diagnosis not present

## 2019-12-18 DIAGNOSIS — R5381 Other malaise: Secondary | ICD-10-CM | POA: Diagnosis present

## 2019-12-18 DIAGNOSIS — E785 Hyperlipidemia, unspecified: Secondary | ICD-10-CM | POA: Diagnosis not present

## 2019-12-18 DIAGNOSIS — Z7982 Long term (current) use of aspirin: Secondary | ICD-10-CM

## 2019-12-18 DIAGNOSIS — J449 Chronic obstructive pulmonary disease, unspecified: Secondary | ICD-10-CM | POA: Diagnosis present

## 2019-12-18 DIAGNOSIS — I130 Hypertensive heart and chronic kidney disease with heart failure and stage 1 through stage 4 chronic kidney disease, or unspecified chronic kidney disease: Secondary | ICD-10-CM | POA: Diagnosis not present

## 2019-12-18 DIAGNOSIS — I739 Peripheral vascular disease, unspecified: Secondary | ICD-10-CM | POA: Diagnosis not present

## 2019-12-18 DIAGNOSIS — E1121 Type 2 diabetes mellitus with diabetic nephropathy: Secondary | ICD-10-CM | POA: Diagnosis not present

## 2019-12-18 DIAGNOSIS — T1490XA Injury, unspecified, initial encounter: Secondary | ICD-10-CM

## 2019-12-18 DIAGNOSIS — Z7984 Long term (current) use of oral hypoglycemic drugs: Secondary | ICD-10-CM | POA: Diagnosis not present

## 2019-12-18 DIAGNOSIS — N281 Cyst of kidney, acquired: Secondary | ICD-10-CM | POA: Diagnosis not present

## 2019-12-18 DIAGNOSIS — S2242XA Multiple fractures of ribs, left side, initial encounter for closed fracture: Secondary | ICD-10-CM

## 2019-12-18 DIAGNOSIS — E1151 Type 2 diabetes mellitus with diabetic peripheral angiopathy without gangrene: Secondary | ICD-10-CM | POA: Diagnosis present

## 2019-12-18 DIAGNOSIS — I358 Other nonrheumatic aortic valve disorders: Secondary | ICD-10-CM | POA: Diagnosis not present

## 2019-12-18 DIAGNOSIS — E441 Mild protein-calorie malnutrition: Secondary | ICD-10-CM | POA: Diagnosis not present

## 2019-12-18 DIAGNOSIS — Z8551 Personal history of malignant neoplasm of bladder: Secondary | ICD-10-CM

## 2019-12-18 DIAGNOSIS — I5032 Chronic diastolic (congestive) heart failure: Secondary | ICD-10-CM | POA: Diagnosis not present

## 2019-12-18 DIAGNOSIS — R0781 Pleurodynia: Secondary | ICD-10-CM | POA: Diagnosis present

## 2019-12-18 DIAGNOSIS — R531 Weakness: Secondary | ICD-10-CM | POA: Diagnosis not present

## 2019-12-18 DIAGNOSIS — Y92092 Bedroom in other non-institutional residence as the place of occurrence of the external cause: Secondary | ICD-10-CM

## 2019-12-18 DIAGNOSIS — S2249XS Multiple fractures of ribs, unspecified side, sequela: Secondary | ICD-10-CM | POA: Diagnosis not present

## 2019-12-18 DIAGNOSIS — N184 Chronic kidney disease, stage 4 (severe): Secondary | ICD-10-CM | POA: Diagnosis not present

## 2019-12-18 DIAGNOSIS — Z20822 Contact with and (suspected) exposure to covid-19: Secondary | ICD-10-CM | POA: Diagnosis present

## 2019-12-18 DIAGNOSIS — N189 Chronic kidney disease, unspecified: Secondary | ICD-10-CM | POA: Diagnosis present

## 2019-12-18 DIAGNOSIS — Z833 Family history of diabetes mellitus: Secondary | ICD-10-CM

## 2019-12-18 DIAGNOSIS — Z66 Do not resuscitate: Secondary | ICD-10-CM | POA: Diagnosis not present

## 2019-12-18 DIAGNOSIS — R0789 Other chest pain: Secondary | ICD-10-CM

## 2019-12-18 DIAGNOSIS — J9811 Atelectasis: Secondary | ICD-10-CM | POA: Diagnosis not present

## 2019-12-18 DIAGNOSIS — R079 Chest pain, unspecified: Secondary | ICD-10-CM | POA: Diagnosis present

## 2019-12-18 DIAGNOSIS — E119 Type 2 diabetes mellitus without complications: Secondary | ICD-10-CM

## 2019-12-18 DIAGNOSIS — S3991XA Unspecified injury of abdomen, initial encounter: Secondary | ICD-10-CM | POA: Diagnosis not present

## 2019-12-18 DIAGNOSIS — I110 Hypertensive heart disease with heart failure: Secondary | ICD-10-CM | POA: Diagnosis not present

## 2019-12-18 LAB — CBC WITH DIFFERENTIAL/PLATELET
Abs Immature Granulocytes: 0.02 10*3/uL (ref 0.00–0.07)
Basophils Absolute: 0.1 10*3/uL (ref 0.0–0.1)
Basophils Relative: 1 %
Eosinophils Absolute: 0.1 10*3/uL (ref 0.0–0.5)
Eosinophils Relative: 2 %
HCT: 34.2 % — ABNORMAL LOW (ref 39.0–52.0)
Hemoglobin: 11 g/dL — ABNORMAL LOW (ref 13.0–17.0)
Immature Granulocytes: 0 %
Lymphocytes Relative: 20 %
Lymphs Abs: 1.1 10*3/uL (ref 0.7–4.0)
MCH: 30.6 pg (ref 26.0–34.0)
MCHC: 32.2 g/dL (ref 30.0–36.0)
MCV: 95.3 fL (ref 80.0–100.0)
Monocytes Absolute: 0.6 10*3/uL (ref 0.1–1.0)
Monocytes Relative: 11 %
Neutro Abs: 3.6 10*3/uL (ref 1.7–7.7)
Neutrophils Relative %: 66 %
Platelets: 204 10*3/uL (ref 150–400)
RBC: 3.59 MIL/uL — ABNORMAL LOW (ref 4.22–5.81)
RDW: 13.5 % (ref 11.5–15.5)
WBC: 5.4 10*3/uL (ref 4.0–10.5)
nRBC: 0 % (ref 0.0–0.2)

## 2019-12-18 LAB — BASIC METABOLIC PANEL
Anion gap: 8 (ref 5–15)
BUN: 27 mg/dL — ABNORMAL HIGH (ref 8–23)
CO2: 25 mmol/L (ref 22–32)
Calcium: 8.8 mg/dL — ABNORMAL LOW (ref 8.9–10.3)
Chloride: 103 mmol/L (ref 98–111)
Creatinine, Ser: 1.52 mg/dL — ABNORMAL HIGH (ref 0.61–1.24)
GFR, Estimated: 41 mL/min — ABNORMAL LOW (ref >60)
Glucose, Bld: 125 mg/dL — ABNORMAL HIGH (ref 70–99)
Potassium: 4.5 mmol/L (ref 3.5–5.1)
Sodium: 136 mmol/L (ref 135–145)

## 2019-12-18 LAB — RESPIRATORY PANEL BY RT PCR (FLU A&B, COVID)
Influenza A by PCR: NEGATIVE
Influenza B by PCR: NEGATIVE
SARS Coronavirus 2 by RT PCR: NEGATIVE

## 2019-12-18 MED ORDER — METHOCARBAMOL 500 MG PO TABS
500.0000 mg | ORAL_TABLET | Freq: Once | ORAL | Status: AC
  Administered 2019-12-18: 500 mg via ORAL
  Filled 2019-12-18 (×2): qty 1

## 2019-12-18 MED ORDER — IOHEXOL 300 MG/ML  SOLN
75.0000 mL | Freq: Once | INTRAMUSCULAR | Status: AC | PRN
  Administered 2019-12-18: 75 mL via INTRAVENOUS

## 2019-12-18 MED ORDER — LIDOCAINE 5 % EX PTCH
1.0000 | MEDICATED_PATCH | CUTANEOUS | Status: DC
  Administered 2019-12-18 – 2019-12-21 (×3): 1 via TRANSDERMAL
  Filled 2019-12-18 (×4): qty 1

## 2019-12-18 MED ORDER — ACETAMINOPHEN 500 MG PO TABS
1000.0000 mg | ORAL_TABLET | Freq: Once | ORAL | Status: AC
  Administered 2019-12-18: 1000 mg via ORAL
  Filled 2019-12-18: qty 2

## 2019-12-18 MED ORDER — VITAMIN B-12 1000 MCG PO TABS
500.0000 ug | ORAL_TABLET | Freq: Every day | ORAL | Status: DC
  Administered 2019-12-19 – 2019-12-22 (×4): 500 ug via ORAL
  Filled 2019-12-18 (×4): qty 1

## 2019-12-18 MED ORDER — SIMVASTATIN 20 MG PO TABS
40.0000 mg | ORAL_TABLET | Freq: Every day | ORAL | Status: DC
  Administered 2019-12-19 – 2019-12-22 (×4): 40 mg via ORAL
  Filled 2019-12-18 (×4): qty 2

## 2019-12-18 MED ORDER — ACETAMINOPHEN 650 MG RE SUPP: 650.0000 mg | Freq: Four times a day (QID) | RECTAL | Status: DC | PRN

## 2019-12-18 MED ORDER — LATANOPROST 0.005 % OP SOLN
1.0000 [drp] | Freq: Every day | OPHTHALMIC | Status: DC
  Administered 2019-12-19 – 2019-12-21 (×3): 1 [drp] via OPHTHALMIC
  Filled 2019-12-18: qty 2.5

## 2019-12-18 MED ORDER — ACETAMINOPHEN 500 MG PO TABS
1000.0000 mg | ORAL_TABLET | Freq: Four times a day (QID) | ORAL | Status: DC | PRN
  Administered 2019-12-18: 1000 mg via ORAL
  Filled 2019-12-18: qty 2

## 2019-12-18 MED ORDER — POTASSIUM CHLORIDE CRYS ER 10 MEQ PO TBCR
10.0000 meq | EXTENDED_RELEASE_TABLET | Freq: Every day | ORAL | Status: DC
  Administered 2019-12-20 – 2019-12-22 (×3): 10 meq via ORAL
  Filled 2019-12-18 (×4): qty 1

## 2019-12-18 MED ORDER — ADULT MULTIVITAMIN W/MINERALS CH
1.0000 | ORAL_TABLET | Freq: Every day | ORAL | Status: DC
  Administered 2019-12-19 – 2019-12-22 (×4): 1 via ORAL
  Filled 2019-12-18 (×4): qty 1

## 2019-12-18 MED ORDER — METOPROLOL SUCCINATE ER 25 MG PO TB24
25.0000 mg | ORAL_TABLET | Freq: Every day | ORAL | Status: DC
  Administered 2019-12-19 – 2019-12-22 (×4): 25 mg via ORAL
  Filled 2019-12-18 (×5): qty 1

## 2019-12-18 MED ORDER — METHOCARBAMOL 1000 MG/10ML IJ SOLN
500.0000 mg | Freq: Three times a day (TID) | INTRAVENOUS | Status: DC | PRN
  Filled 2019-12-18 (×2): qty 5

## 2019-12-18 MED ORDER — GLIPIZIDE ER 5 MG PO TB24
10.0000 mg | ORAL_TABLET | Freq: Every day | ORAL | Status: DC
  Filled 2019-12-18: qty 2

## 2019-12-18 MED ORDER — ACETAMINOPHEN 325 MG PO TABS
650.0000 mg | ORAL_TABLET | Freq: Four times a day (QID) | ORAL | Status: DC | PRN
  Filled 2019-12-18: qty 2

## 2019-12-18 MED ORDER — PANTOPRAZOLE SODIUM 40 MG PO TBEC
40.0000 mg | DELAYED_RELEASE_TABLET | Freq: Every day | ORAL | Status: DC
  Administered 2019-12-19 – 2019-12-22 (×4): 40 mg via ORAL
  Filled 2019-12-18 (×4): qty 1

## 2019-12-18 MED ORDER — SODIUM CHLORIDE 0.9% FLUSH
3.0000 mL | Freq: Two times a day (BID) | INTRAVENOUS | Status: DC
  Administered 2019-12-18 – 2019-12-21 (×6): 3 mL via INTRAVENOUS

## 2019-12-18 NOTE — ED Notes (Signed)
Pt eating meal tray 

## 2019-12-18 NOTE — ED Notes (Signed)
Pt working with respiratory. Pt instructed on IS use.

## 2019-12-18 NOTE — ED Notes (Signed)
Images POWERSHARED to [facility] by Butch Penny in radiology

## 2019-12-18 NOTE — ED Notes (Signed)
Pt returned to room from xray.

## 2019-12-18 NOTE — ED Triage Notes (Signed)
Pt to ED via [facility] from home for multiple falls. Pt has bruised ribs from previous fall. Pt fell again last night and hit ribs on left side. Pt is having a hard time take a deep breath due to pain. Pt SpO2 93% on room air. Pt is in NAD.

## 2019-12-18 NOTE — H&P (Signed)
History and Physical   DEVERE BREM JYN:[id number] DOB: 1919-12-11 DOA: 12/18/2019  PCP: Rusty Aus, MD   Patient [facility]: [facility] via [facility]  Chief Complaint: Fall and chest pain  HPI: Vincent Jackson is a 86 y.o. male with medical history significant of CAD status post CABG x3 in 1998 and subsequent stenting, sick sinus syndrome status post pacemaker, hypertension, hyperlipidemia, diabetes, CHF, COPD, CKD, aortic stenosis who presented following a fall.  Patient typically ambulates with a cane or Rollator.  He had a mechanical last night when walking in his bedroom.  He landed on his left side.  He denies any head trauma, nor preceding symptoms.  He states he just lost his balance.  He states he has had a few falls over the last couple months. He has left-sided aching chest pain initially 6 out of 10 in intensity, but states the pain is now improved as long as he does not lie on his thyroid or blood pressure on it.  Denies other injuries.  Denies fever, cough, chest pain, shortness of breath, abdominal pain, nausea, diarrhea, constipation.  ED Course: Initially with some hypertension to the 865H systolic but vital signs have been stable since.  Lab work showed only stable elevated creatinine of 1.5 consistent with known CKD.  EKG not yet obtained.  Chest x-ray showed rib fractures.  CT chest abdomen pelvis showed fractures of the left fourth rib 10 ribs and fractures of left 8 through 10 ribs in 2 locations concerning for flail chest physiology.  Patient was unable to be transferred to [facility] or [facility] due to bed availability and was accepted by trauma who will see in consultation with hospitalist admitting.  Review of Systems: As per HPI otherwise all other systems reviewed and are negative.  Past Medical History:  Diagnosis Date  . CAD (coronary artery disease)   . Cancer [facility])    Bladder cancer   . Diabetes mellitus without complication ([place])   . Diastolic  heart failure ([place])   . Hyperlipidemia   . Prostate cancer [facility])     Past Surgical History:  Procedure Laterality Date  . BLADDER REMOVAL  2007  . MASTOIDECTOMY    . PACEMAKER INSERTION Left 02/24/2019   Procedure: PACEMAKER  GENERATOR CHANGE OUT;  Surgeon: Isaias Cowman, MD;  Location: [facility] ORS;  Service: Cardiovascular;  Laterality: Left;  . PROSTATE SURGERY  2007   removed   . TONSILLECTOMY      Social History  reports that he quit smoking about 22 years ago. He has never used smokeless tobacco. He reports current alcohol use. He reports that he does not use drugs.  No Known Allergies  Family History  Problem Relation Age of Onset  . Cataracts Other   . Diabetes Other   Reviewed admission  Prior to Admission medications   Medication Sig Start Date End Date Taking? Authorizing Provider  aspirin EC 81 MG tablet Take 81 mg by mouth daily.     [provider]  Cyanocobalamin 500 MCG SUBL Place 500 mcg under the tongue daily. 08/07/16   [provider]  glipiZIDE (GLUCOTROL XL) 10 MG 24 hr tablet TAKE ONE TABLET TWICE A DAY FOR BLOOD SUGAR Patient taking differently: Take 10 mg by mouth daily with breakfast.  05/16/16   Leone Haven, MD  latanoprost (XALATAN) 0.005 % ophthalmic solution Place 1 drop into both eyes at bedtime.  11/28/15   [provider]  metoprolol succinate (  TOPROL-XL) 25 MG 24 hr tablet Take 25 mg by mouth daily.  07/05/15   [provider]  Multiple Vitamin (MULTIVITAMIN WITH MINERALS) TABS tablet Take 1 tablet by mouth daily.    [provider]  pantoprazole (PROTONIX) 40 MG tablet TAKE ONE TABLET EVERY MORNING FOR GERD Patient taking differently: Take 40 mg by mouth daily.  08/21/16   Leone Haven, MD  potassium chloride (KLOR-CON) 10 MEQ tablet Take 10 mEq by mouth daily.    [provider]  simvastatin (ZOCOR) 40 MG tablet Take 40 mg by mouth daily. 01/07/19   [provider]     Physical Exam: Vitals:   12/18/19 2121 12/19/19 0005  BP: 133/72 111/78  Pulse: 93   Resp: 18 13    Physical Exam Constitutional:      General: He is not in acute distress.    Appearance: Normal appearance.     Comments: Thin elderly male  HENT:     Head: Normocephalic and atraumatic.     Mouth/Throat:     Mouth: Mucous membranes are moist.     Pharynx: Oropharynx is clear.  Eyes:     Extraocular Movements: Extraocular movements intact.     Pupils: Pupils are equal, round, and reactive to light.  Cardiovascular:     Rate and Rhythm: Normal rate and regular rhythm.     Pulses: Normal pulses.     Heart sounds: Normal heart sounds.  Pulmonary:     Effort: Pulmonary effort is normal. No respiratory distress.     Breath sounds: Normal breath sounds.  Abdominal:     General: Bowel sounds are normal. There is no distension.     Palpations: Abdomen is soft.     Tenderness: There is no abdominal tenderness.  Musculoskeletal:        General: Tenderness (L chest wall) present. No swelling or deformity.  Skin:    General: Skin is warm and dry.  Neurological:     General: No focal deficit present.     Mental Status: Mental status is at baseline.    Labs on Admission: I have personally reviewed following labs and imaging studies  CBC: Recent Labs  Lab 12/18/19 1148  WBC 5.4  NEUTROABS 3.6  HGB 11.0*  HCT 34.2*  MCV 95.3  PLT 552    Basic Metabolic Panel: Recent Labs  Lab 12/18/19 1148  NA 136  K 4.5  CL 103  CO2 25  GLUCOSE 125*  BUN 27*  CREATININE 1.52*  CALCIUM 8.8*    GFR: CrCl cannot be calculated (Unknown ideal weight.).  Liver Function Tests: No results for input(s): AST, ALT, ALKPHOS, BILITOT, PROT, ALBUMIN in the last 168 hours.  Urine analysis:    Component Value Date/Time   COLORURINE YELLOW (A) 03/12/2016 1152   APPEARANCEUR HAZY (A) 03/12/2016 1152   APPEARANCEUR Cloudy 11/23/2013 0251   LABSPEC 1.011 03/12/2016 1152   LABSPEC  1.009 11/23/2013 0251   PHURINE 9.0 (H) 03/12/2016 1152   GLUCOSEU NEGATIVE 03/12/2016 1152   GLUCOSEU Negative 11/23/2013 0251   HGBUR NEGATIVE 03/12/2016 1152   BILIRUBINUR NEGATIVE 03/12/2016 1152   BILIRUBINUR Negative 11/23/2013 0251   [place] 03/12/2016 1152   PROTEINUR 30 (A) 03/12/2016 1152   NITRITE POSITIVE (A) 03/12/2016 1152   LEUKOCYTESUR NEGATIVE 03/12/2016 1152   LEUKOCYTESUR Trace 11/23/2013 0251    Radiological Exams on Admission: DG Chest 2 View  Result Date: 12/18/2019 CLINICAL DATA:  Multiple falls EXAM: CHEST - 2 VIEW  COMPARISON:  03/12/2016 FINDINGS: Stable positioning of left-sided implanted cardiac device. Heart size is upper limits of normal, unchanged. Densely calcified thoracic aorta. Small left pleural effusion with associated streaky left basilar opacities. Minimally displaced lateral right fifth rib fracture. Possible nondisplaced lateral left fifth rib fracture, partially obscured by overlying pacer. No pneumothorax is seen. Mild compression deformity within the upper lumbar spine seen on lateral view. IMPRESSION: 1. Minimally displaced lateral right fifth rib fracture. 2. Possible nondisplaced lateral left fifth rib fracture. 3. No pneumothorax. 4. Small left pleural effusion with associated streaky left basilar opacities, favor atelectasis. 5. Mild compression deformity within the upper lumbar spine seen on lateral view, age indeterminate. Electronically Signed   By: Davina Poke D.O.   On: 12/18/2019 11:09   CT CHEST ABDOMEN PELVIS W CONTRAST  Result Date: 12/18/2019 CLINICAL DATA:  Fall, rib fracture, intra-abdominal trauma, pleurisy EXAM: CT CHEST, ABDOMEN, AND PELVIS WITH CONTRAST TECHNIQUE: Multidetector CT imaging of the chest, abdomen and pelvis was performed following the standard protocol during bolus administration of intravenous contrast. CONTRAST:  11mL OMNIPAQUE IOHEXOL 300 MG/ML  SOLN COMPARISON:  07/03/2005 FINDINGS: CT CHEST  FINDINGS Cardiovascular: There is extensive multi-vessel coronary artery calcification. Moderate calcification of the aortic valvular leaflets, the left ventricular outflow tract, the mitral valve leaflets and the mitral valve annulus. Mild left ventricular hypertrophy. Left subclavian pacemaker leads are seen within the right atrium and right ventricle. No pericardial effusion. The central pulmonary arteries are enlarged in keeping with changes of pulmonary arterial hypertension. There is extensive atherosclerotic calcification noted within the aortic arch and descending thoracic aorta. No aortic aneurysm. Mediastinum/Nodes: Thyroid unremarkable. No pathologic thoracic adenopathy. Esophagus unremarkable peer Lungs/Pleura: Mild bibasilar interstitial fibrosis. Small left pleural effusion with associated left basilar atelectasis. No pneumothorax. Central airways are widely patent. Musculoskeletal: There are acute fractures of the left 4-10 ribs laterally with additional fractures of ribs 8-10 posteriorly. This may result in a free-floating segment of the chest wall in keeping with a flail chest. There is associated soft tissue swelling involving the lateral thoracic wall surrounding the rib fractures. CT ABDOMEN PELVIS FINDINGS Hepatobiliary: No focal liver abnormality is seen. No gallstones, gallbladder wall thickening, or biliary dilatation. Pancreas: Unremarkable Spleen: Unremarkable Adrenals/Urinary Tract: Severe left renal atrophy, possibly related to a chronic left UPJ obstruction. Mild right renal cortical atrophy. Multiple simple cortical cyst noted within the right kidney. No hydronephrosis on the right. No intrarenal enhancing masses or calcifications identified. The adrenal glands are unremarkable. Surgical changes of cystectomy and ileal conduit formation identified with the ostomy noted within the right lower quadrant. Stomach/Bowel: Stomach and small bowel are unremarkable. Moderate descending and  severe sigmoid colonic diverticulosis. The large bowel is otherwise unremarkable. Appendix unremarkable. No free intraperitoneal gas or fluid peer Vascular/Lymphatic: Extensive atherosclerotic calcification within the abdominal aorta and iliofemoral vasculature. Probable hemodynamically significant stenoses of the mesenteric arterial vasculature and right common iliac artery origin, not well characterized on this non arteriographic study. No aortic aneurysm. No pathologic adenopathy within the abdomen and pelvis. Reproductive: Status post hysterectomy. No adnexal masses. Other: Mild gaseous distention of the rectum without surrounding inflammatory change. Musculoskeletal: Right femoral neck ORIF utilizing 3 axial screws noted. Remote fractures of the left superior and inferior pubic rami are noted. No acute fracture of the osseous structures of the abdomen and pelvis. Remote compression fracture of L1 and L2 noted. Advanced degenerative changes noted within the lumbar spine. IMPRESSION: Acute fractures of the left 4-10 ribs with fractures of the  left 8-10 ribs in 2 locations possibly resulting in flail chest physiology. Associated small left pleural effusion and left basilar atelectasis. No pneumothorax. Extensive coronary artery calcification. Extensive calcification of the aortic and mitral valves may indicate underlying valvular dysfunction. This would be better assessed with echocardiography if indicated. Mild left ventricular hypertrophy noted. Peripheral vascular disease. Possible hemodynamically significant stenoses of the mesenteric arteries and right lower extremity inflow. If indicated, this would better assessed with CT arteriography. No acute intra-abdominal injury. Aortic Atherosclerosis (ICD10-I70.0). Electronically Signed   By: Fidela Salisbury MD   On: 12/18/2019 13:25    EKG: Ordered but not yet obtained  Assessment/Plan Principal Problem:   Flail chest Active Problems:   CAD (coronary artery  disease)   Hyperlipidemia   Diabetes ([place])   PVD (peripheral vascular disease) ([place])   Fall  Chest Pain Flail chest > At least 2 rib fractures along 3+ ribs on CT, with concern for flail chest physiology > Patient transferred from [facility] to be seen by trauma surgery who are following - Pain control with Tylenol and Robaxin - Remain on supplemental oxygen - Incentive spirometry - A.m. chest x-ray  Falls > Patient with several falls over the last 2 months, most readings last night > Patient already resides in skilled nursing facility - PT/OT evaluation  Peripheral Vascular Disease Hyperlipidemia CAD status post CABG x3 in 1998 and subsequent stenting Sick sinus syndrome status post pacemaker - Continue home metoprolol, simvastatin - Holding home aspirin  Diabetes - Continue home glipizide  CKD - Creatinine stable at 1.5 on admission - Follow renal function   DVT prophylaxis: SCDs, pending surgery evaluation  Code Status:   DNR  Family Communication:  Family updated by ED provider at [facility] Disposition Plan:   Patient is from:  Skilled nursing facility  Anticipated DC to:  Skilled nursing facility  Anticipated DC date:  Pending clinical course    Consults called:  Trauma, Dr. Rosendo Gros Admission status:  Inpatient, telemetry   Severity of Illness: The appropriate patient status for this patient is INPATIENT. Inpatient status is judged to be reasonable and necessary in order to provide the required intensity of service to ensure the patient's safety. The patient's presenting symptoms, physical exam findings, and initial radiographic and laboratory data in the context of their chronic comorbidities is felt to place them at high risk for further clinical deterioration. Furthermore, it is not anticipated that the patient will be medically stable for discharge from the hospital within 2 midnights of admission. The following factors support the patient status of inpatient.   " The  patient's presenting symptoms include chest wall pain. " The worrisome physical exam findings include chest wall pain. " The initial radiographic and laboratory data are worrisome because of multiple rib fractures on 3+ ribs. " The chronic co-morbidities include CAD, diabetes.  * I certify that at the point of admission it is my clinical judgment that the patient will require inpatient hospital care spanning beyond 2 midnights from the point of admission due to high intensity of service, high risk for further deterioration and high frequency of surveillance required.Marcelyn Bruins MD [facility]  How to contact the [facility] Attending or Consulting provider [place] or covering provider during after hours [place], for this patient?   1. Check the care team in [facility] and look for a) attending/consulting [facility] provider listed and b) the [facility] team listed 2. Log into [email or web address] and use [place]'s universal password  to access. If you do not have the password, please contact the hospital operator. 3. Locate the [facility] provider you are looking for under [facility] and page to a number that you can be directly reached. 4. If you still have difficulty reaching the provider, please page the [facility] (Director on Call) for the Hospitalists listed on amion for assistance.  12/19/2019, 1:14 AM

## 2019-12-18 NOTE — ED Provider Notes (Signed)
[facility] Emergency Department Provider Note ____________________________________________   First MD Initiated Contact with Patient 12/18/19 1025     (approximate)  I have reviewed the triage vital signs and the nursing notes.  HISTORY  Chief Complaint Fall and Chest Pain   HPI Vincent Jackson is a 86 y.o. malewho presents to the ED for evaluation of recurrent falls and left-sided chest wall pain.  Chart review indicates history of CAD s/p CABG x3 in 1998, subsequent stenting multiple times.  Dual-chamber pacer for sick sinus syndrome.  HTN, HLD, DM, dCHF, COPD, CKD, aortic stenosis.  Underlying complete heart block when pacer was changed out 1 year ago.  Aspirin 81, but no proper anticoagulation.   Patient resides in the independent living section of a local SNF, [facility].  His wife used to reside with him, but today is being moved to the memory care unit of the same facility.  Patient reports typically ambulating with a cane or Rollator.  Patient reports being in his typical state of health until accidentally falling this morning.  He reports walking independently between his wife's bed and the wall when he "got tripped up" and fell to the ground, landing on his left side.  Patient denies syncope, head trauma, vomiting, chest pain, abdominal pain prior to the event.  Patient reports landing on his left side and has had subsequent 6/10 intensity left-sided chest pain that is aching in nature, worsened with deep inspiration and position changes.  Denies additional signs of injury, pain or concerns.  He is already requesting discharge to be back at the facility with his wife.  He is agreeable to stay for 2 view chest x-ray and EKG.  I advised him that if this x-ray is abnormal, I would likely recommend CT images, and he is agreeable to "think about it" if the situation arises.  Past Medical History:  Diagnosis Date  . CAD (coronary artery disease)    . Cancer [facility])    Bladder cancer   . Diabetes mellitus without complication ([place])   . Diastolic heart failure ([place])   . Hyperlipidemia   . Prostate cancer [facility])     Patient Active Problem List   Diagnosis Date Noted  . Fall 05/19/2016  . Obstruction of left tear duct 05/19/2016  . Hip pain 04/16/2016  . Viral sepsis ([place]) 03/12/2016  . Influenza 03/12/2016  . PVD (peripheral vascular disease) ([place]) 01/24/2016  . Decreased pedal pulses 12/16/2015  . History of bladder cancer 12/16/2015  . CAD (coronary artery disease) 09/09/2015  . Hyperlipidemia 09/09/2015  . Diabetes ([place]) 09/09/2015    Past Surgical History:  Procedure Laterality Date  . BLADDER REMOVAL  2007  . MASTOIDECTOMY    . PACEMAKER INSERTION Left 02/24/2019   Procedure: PACEMAKER  GENERATOR CHANGE OUT;  Surgeon: Isaias Cowman, MD;  Location: [facility] ORS;  Service: Cardiovascular;  Laterality: Left;  . PROSTATE SURGERY  2007   removed   . TONSILLECTOMY      Prior to Admission medications   Medication Sig Start Date End Date Taking? Authorizing Provider  aspirin EC 81 MG tablet Take 81 mg by mouth daily.     [provider]  cephALEXin (KEFLEX) 250 MG capsule Take 1 capsule (250 mg total) by mouth 2 (two) times daily. 02/24/19   Paraschos, Alexander, MD  glipiZIDE (GLUCOTROL XL) 10 MG 24 hr tablet TAKE ONE TABLET TWICE A DAY FOR BLOOD SUGAR Patient taking differently: Take 10 mg by  mouth 2 (two) times daily.  05/16/16   Leone Haven, MD  latanoprost (XALATAN) 0.005 % ophthalmic solution Place 1 drop into both eyes at bedtime.  11/28/15   [provider]  metoprolol succinate (TOPROL-XL) 25 MG 24 hr tablet Take 25 mg by mouth daily.  07/05/15   [provider]  Multiple Vitamin (MULTIVITAMIN WITH MINERALS) TABS tablet Take 1 tablet by mouth daily.    [provider]  pantoprazole (PROTONIX) 40 MG tablet TAKE ONE TABLET EVERY MORNING FOR GERD Patient taking differently:  Take 40 mg by mouth daily.  08/21/16   Leone Haven, MD  potassium chloride (KLOR-CON) 10 MEQ tablet Take 10 mEq by mouth daily.    [provider]    Allergies Patient has no known allergies.  Family History  Problem Relation Age of Onset  . Cataracts Other   . Diabetes Other     Social History Social History   Tobacco Use  . Smoking status: Former Smoker    Quit date: 09/08/1997    Years since quitting: 22.2  . Smokeless tobacco: Never Used  Substance Use Topics  . Alcohol use: Yes    Alcohol/week: 0.0 standard drinks    Comment: Occasional   . Drug use: No    Review of Systems  Constitutional: No fever/chills Eyes: No visual changes. ENT: No sore throat. Cardiovascular: Denies chest pain. Respiratory: Denies shortness of breath. Gastrointestinal: No abdominal pain.  No nausea, no vomiting.  No diarrhea.  No constipation. Genitourinary: Negative for dysuria. Musculoskeletal: Negative for back pain.  Left-sided chest wall pain Skin: Negative for rash. Neurological: Negative for headaches, focal weakness or numbness.   ____________________________________________   PHYSICAL EXAM:  VITAL SIGNS: Vitals:   12/18/19 1230 12/18/19 1345  BP: (!) 150/111 (!) 161/83  Pulse: 93 89  Resp: (!) 29 (!) 52  Temp:    SpO2: 93% 96%      Constitutional: Alert and oriented. Well appearing and in no acute distress. Eyes: Conjunctivae are normal. PERRL. EOMI. Head: Atraumatic. Nose: No congestion/rhinnorhea. Mouth/Throat: Mucous membranes are moist.  Oropharynx non-erythematous. Neck: No stridor. No cervical spine tenderness to palpation. Cardiovascular: Normal rate, regular rhythm. Grossly normal heart sounds.  Good peripheral circulation. Respiratory: Normal respiratory effort.  No retractions. Lungs CTAB. Gastrointestinal: Soft , nondistended, nontender to palpation. No abdominal bruits. No CVA tenderness.  Benign abdomen. Musculoskeletal: No lower  extremity tenderness nor edema.  No joint effusions.  Scattered bruises and skin tears to the bilateral upper extremities from previous falls.  No acute deformities, active bleeding or discrete lacerations noted. Full active and passive ROM to bilateral wrists, elbows and shoulder joints.  No evidence of trauma to the lower extremities. Diffuse and poorly localizing tenderness to palpation to the left lateral and inferior chest wall/rib cage.  No bony step-offs noted and no evidence of open injury.  Is a small superficial abrasion over his left lateral chest wall. Neurologic:  Normal speech and language. No gross focal neurologic deficits are appreciated. No gait instability noted.  Cogent and sharp. Cranial nerves II through XII intact 5/5 strength and sensation in all 4 extremities Skin:  Skin is warm, dry and intact. No rash noted. Psychiatric: Mood and affect are normal. Speech and behavior are normal.  ____________________________________________   LABS (all labs ordered are listed, but only abnormal results are displayed)  Labs Reviewed  CBC WITH DIFFERENTIAL/PLATELET - Abnormal; Notable for the following components:      Result Value  RBC 3.59 (*)    Hemoglobin 11.0 (*)    HCT 34.2 (*)    All other components within normal limits  BASIC METABOLIC PANEL - Abnormal; Notable for the following components:   Glucose, Bld 125 (*)    BUN 27 (*)    Creatinine, Ser 1.52 (*)    Calcium 8.8 (*)    GFR, Estimated 41 (*)    All other components within normal limits  RESPIRATORY PANEL BY RT PCR (FLU A&B, COVID)   ____________________________________________  RADIOLOGY  ED MD interpretation: 2 view CXR reviewed by me and significant for couple left sided rib fractures. No PTX  Official radiology report(s): DG Chest 2 View  Result Date: 12/18/2019 CLINICAL DATA:  Multiple falls EXAM: CHEST - 2 VIEW COMPARISON:  03/12/2016 FINDINGS: Stable positioning of left-sided implanted cardiac  device. Heart size is upper limits of normal, unchanged. Densely calcified thoracic aorta. Small left pleural effusion with associated streaky left basilar opacities. Minimally displaced lateral right fifth rib fracture. Possible nondisplaced lateral left fifth rib fracture, partially obscured by overlying pacer. No pneumothorax is seen. Mild compression deformity within the upper lumbar spine seen on lateral view. IMPRESSION: 1. Minimally displaced lateral right fifth rib fracture. 2. Possible nondisplaced lateral left fifth rib fracture. 3. No pneumothorax. 4. Small left pleural effusion with associated streaky left basilar opacities, favor atelectasis. 5. Mild compression deformity within the upper lumbar spine seen on lateral view, age indeterminate. Electronically Signed   By: Davina Poke D.O.   On: 12/18/2019 11:09   CT CHEST ABDOMEN PELVIS W CONTRAST  Result Date: 12/18/2019 CLINICAL DATA:  Fall, rib fracture, intra-abdominal trauma, pleurisy EXAM: CT CHEST, ABDOMEN, AND PELVIS WITH CONTRAST TECHNIQUE: Multidetector CT imaging of the chest, abdomen and pelvis was performed following the standard protocol during bolus administration of intravenous contrast. CONTRAST:  78mL OMNIPAQUE IOHEXOL 300 MG/ML  SOLN COMPARISON:  07/03/2005 FINDINGS: CT CHEST FINDINGS Cardiovascular: There is extensive multi-vessel coronary artery calcification. Moderate calcification of the aortic valvular leaflets, the left ventricular outflow tract, the mitral valve leaflets and the mitral valve annulus. Mild left ventricular hypertrophy. Left subclavian pacemaker leads are seen within the right atrium and right ventricle. No pericardial effusion. The central pulmonary arteries are enlarged in keeping with changes of pulmonary arterial hypertension. There is extensive atherosclerotic calcification noted within the aortic arch and descending thoracic aorta. No aortic aneurysm. Mediastinum/Nodes: Thyroid unremarkable. No  pathologic thoracic adenopathy. Esophagus unremarkable peer Lungs/Pleura: Mild bibasilar interstitial fibrosis. Small left pleural effusion with associated left basilar atelectasis. No pneumothorax. Central airways are widely patent. Musculoskeletal: There are acute fractures of the left 4-10 ribs laterally with additional fractures of ribs 8-10 posteriorly. This may result in a free-floating segment of the chest wall in keeping with a flail chest. There is associated soft tissue swelling involving the lateral thoracic wall surrounding the rib fractures. CT ABDOMEN PELVIS FINDINGS Hepatobiliary: No focal liver abnormality is seen. No gallstones, gallbladder wall thickening, or biliary dilatation. Pancreas: Unremarkable Spleen: Unremarkable Adrenals/Urinary Tract: Severe left renal atrophy, possibly related to a chronic left UPJ obstruction. Mild right renal cortical atrophy. Multiple simple cortical cyst noted within the right kidney. No hydronephrosis on the right. No intrarenal enhancing masses or calcifications identified. The adrenal glands are unremarkable. Surgical changes of cystectomy and ileal conduit formation identified with the ostomy noted within the right lower quadrant. Stomach/Bowel: Stomach and small bowel are unremarkable. Moderate descending and severe sigmoid colonic diverticulosis. The large bowel is otherwise unremarkable. Appendix unremarkable.  No free intraperitoneal gas or fluid peer Vascular/Lymphatic: Extensive atherosclerotic calcification within the abdominal aorta and iliofemoral vasculature. Probable hemodynamically significant stenoses of the mesenteric arterial vasculature and right common iliac artery origin, not well characterized on this non arteriographic study. No aortic aneurysm. No pathologic adenopathy within the abdomen and pelvis. Reproductive: Status post hysterectomy. No adnexal masses. Other: Mild gaseous distention of the rectum without surrounding inflammatory change.  Musculoskeletal: Right femoral neck ORIF utilizing 3 axial screws noted. Remote fractures of the left superior and inferior pubic rami are noted. No acute fracture of the osseous structures of the abdomen and pelvis. Remote compression fracture of L1 and L2 noted. Advanced degenerative changes noted within the lumbar spine. IMPRESSION: Acute fractures of the left 4-10 ribs with fractures of the left 8-10 ribs in 2 locations possibly resulting in flail chest physiology. Associated small left pleural effusion and left basilar atelectasis. No pneumothorax. Extensive coronary artery calcification. Extensive calcification of the aortic and mitral valves may indicate underlying valvular dysfunction. This would be better assessed with echocardiography if indicated. Mild left ventricular hypertrophy noted. Peripheral vascular disease. Possible hemodynamically significant stenoses of the mesenteric arteries and right lower extremity inflow. If indicated, this would better assessed with CT arteriography. No acute intra-abdominal injury. Aortic Atherosclerosis (ICD10-I70.0). Electronically Signed   By: Fidela Salisbury MD   On: 12/18/2019 13:25   ____________________________________________   PROCEDURES and INTERVENTIONS  Procedure(s) performed (including Critical Care):  .1-3 Lead EKG Interpretation Performed by: Vladimir Crofts, MD Authorized by: Vladimir Crofts, MD     Interpretation: abnormal     ECG rate:  100   ECG rate assessment: tachycardic     Rhythm: sinus tachycardia     Ectopy: none     Conduction: normal   .Critical Care Performed by: Vladimir Crofts, MD Authorized by: Vladimir Crofts, MD   Critical care provider statement:    Critical care time (minutes):  45   Critical care was time spent personally by me on the following activities:  Discussions with consultants, evaluation of patient's response to treatment, examination of patient, ordering and performing treatments and interventions, ordering and  review of laboratory studies, ordering and review of radiographic studies, pulse oximetry, re-evaluation of patient's condition, obtaining history from patient or surrogate and review of old charts    Medications  acetaminophen (TYLENOL) tablet 1,000 mg (1,000 mg Oral Given 12/18/19 1104)  iohexol (OMNIPAQUE) 300 MG/ML solution 75 mL (75 mLs Intravenous Contrast Given 12/18/19 1252)    ____________________________________________   MDM / ED COURSE  86 year old DNR patient on ASA81 only presents to the ED after mechanical fall, found to have multiple contiguous rib fractures with evidence of a couple flail segments requiring transfer to a trauma capable institution.  Patient tachycardic on presentation, otherwise hemodynamically stable.  While he is not hypoxic on room air, he is quite dyspneic and tachypneic and so supplemental oxygen was applied for his comfort.  Exam demonstrates diffuse tenderness to palpation over his left lateral chest wall without evidence of open injury.  His abdomen is benign he has no evidence of neurovascular deficits.  No evidence of trauma to his extremities acutely.  Blood work shows CKD at baseline.  Imaging demonstrates left-sided rib fractures between 4-10, with evidence of flail segments between 8-10.  I spoke with multiple trauma institutions who are all full and unable to accept the patient this time.  [facility] indicates that they would be happy to accept the patient to a monitored hospitalist bed with  trauma surgery consulting, which will likely be the fastest route to have the patient be seen by trauma surgeon and maintained under observation.  Most concerned about his decompensation from respiratory standpoint.  Patient signed out to oncoming provider in the ED to help facilitate transfer once bed assignment has been obtained.  We will keep the patient on supplemental oxygen for comfort, pain controlled with Tylenol and have respiratory see the patient for  incentive spirometry and assist with pulmonary toilet.  Clinical Course as of Dec 17 1541  Fri Dec 18, 2019  1119 Educated patient on x-ray results with at least 2 fractured ribs.  Advised him of my recommendations for IV placement and CT imaging.  He is agreeable.   [DS]  29 Educated patient on CT findings with extensive fracture burden and possible flail segments.  We discussed his extremely high likelihood for clinical decompensation over the next couple days, even though he looks okay right now.  He is agreeable to transfer, and prefers [place] as his destination.   [DS]  8916 I subsequently talked to his daughter, Vincent Jackson, over the phone and explained the same things.  She is agreeable.  She agrees with DNR status as documented   [DS]  [facility] full at this time and not able to accept.   [facility] is not an autoaccept, but they call me back and page out to trauma service   [DS]  1455 [facility] unable to accept as they are full as well.  I speak with the trauma surgeon at [facility] who indicates that he is happy to accept the patient in consultation to a hospitalist admission, but they have mechanically ventilated patient stuck in the ED waiting for admission/this patient will not be able to transfer anytime soon.  I asked our secretary to page intensivist at our facility.   [DS]  9450 [place] with Dr. Enis Slipper, [facility] hospitalist, who agrees to admit the patient to a telemetry bed   [DS]    Clinical Course User Index [DS] Vladimir Crofts, MD     ____________________________________________   FINAL CLINICAL IMPRESSION(S) / ED DIAGNOSES  Final diagnoses:  Trauma  Closed fracture of multiple ribs of left side, initial encounter  Closed fracture of multiple ribs with flail chest, initial encounter  Fall, initial encounter  Other chest pain     ED Discharge Orders    None       Kasondra Junod   Note:  This document was prepared using Dragon voice recognition software and may  include unintentional dictation errors.   Vladimir Crofts, MD 12/18/19 [phone]

## 2019-12-18 NOTE — ED Notes (Signed)
This RN spoke to daughter [name] who stated concerns with pt going back home to care for himself d/t multiple falls recently and wife being placed in memory care and unable to care for self. States she is looking into moving pt to Assisted living or hiring a caregiver for him at independent living.  Dr Tamala Julian notified of daughter's concerns

## 2019-12-18 NOTE — ED Notes (Signed)
Pt has room assigned bed [facility] [id number] call report number is [phone].  spoke with Abbe Amsterdam at [facility]

## 2019-12-18 NOTE — ED Notes (Addendum)
Daughter updated by Dr Tamala Julian, she verbalizes agreement with transfer at this time

## 2019-12-18 NOTE — H&P (Signed)
Trauma History and Physical   Chief Complaint: rib pain HPI: Vincent Jackson is an 86 y.o. male who presented as a trauma transfer admitted to the medicine service due to multiple medical comorbidities and advanced age, who presents with rib fractures s/p fall from standing. He states that he fell onto his left side one night prior and it still hurt in the morning so he presented for evaluation. He also notes that he fell last week and about 3  Weeks before that, all due to imbalance. He usually walks with a cain or walker, but admits that when he is in his house he does not use assistance as he believes he should.   At OSH ED: Chest abdomen pelvis showed fractures of the left fourth rib 10 ribs and fractures of left 8 through 10 ribs in 2 locations concerning for flail chest physiology. He was transferred to [facility] for further care.   Past Medical History:  Diagnosis Date  . CAD (coronary artery disease)   . Cancer [facility])    Bladder cancer   . Diabetes mellitus without complication ([place])   . Diastolic heart failure ([place])   . Hyperlipidemia   . Prostate cancer [facility])     Past Surgical History:  Procedure Laterality Date  . BLADDER REMOVAL  2007  . MASTOIDECTOMY    . PACEMAKER INSERTION Left 02/24/2019   Procedure: PACEMAKER  GENERATOR CHANGE OUT;  Surgeon: Isaias Cowman, MD;  Location: [facility] ORS;  Service: Cardiovascular;  Laterality: Left;  . PROSTATE SURGERY  2007   removed   . TONSILLECTOMY      Family History  Problem Relation Age of Onset  . Cataracts Other   . Diabetes Other    Social History:  reports that he quit smoking about 22 years ago. He has never used smokeless tobacco. He reports current alcohol use. He reports that he does not use drugs.  Allergies: No Known Allergies  Medications Prior to Admission  Medication Sig Dispense Refill  . aspirin EC 81 MG tablet Take 81 mg by mouth daily.     . Cyanocobalamin 500 MCG SUBL Place 500 mcg under the tongue daily.     Marland Kitchen glipiZIDE (GLUCOTROL XL) 10 MG 24 hr tablet TAKE ONE TABLET TWICE A DAY FOR BLOOD SUGAR (Patient taking differently: Take 10 mg by mouth daily with breakfast. ) 180 tablet 1  . latanoprost (XALATAN) 0.005 % ophthalmic solution Place 1 drop into both eyes at bedtime.     . metoprolol succinate (TOPROL-XL) 25 MG 24 hr tablet Take 25 mg by mouth daily.     . Multiple Vitamin (MULTIVITAMIN WITH MINERALS) TABS tablet Take 1 tablet by mouth daily.    . pantoprazole (PROTONIX) 40 MG tablet TAKE ONE TABLET EVERY MORNING FOR GERD (Patient taking differently: Take 40 mg by mouth daily. ) 30 tablet 2  . potassium chloride (KLOR-CON) 10 MEQ tablet Take 10 mEq by mouth daily.    . simvastatin (ZOCOR) 40 MG tablet Take 40 mg by mouth daily.      Results for orders placed or performed during the hospital encounter of 12/18/19 (from the past 48 hour(s))  CBC with Differential     Status: Abnormal   Collection Time: 12/18/19 11:48 AM  Result Value Ref Range   WBC 5.4 4.0 - 10.5 K/uL   RBC 3.59 (L) 4.22 - 5.81 MIL/uL   Hemoglobin 11.0 (L) 13.0 - 17.0 g/dL   HCT 34.2 (L) 39 - 52 %  MCV 95.3 80.0 - 100.0 fL   MCH 30.6 26.0 - 34.0 pg   MCHC 32.2 30.0 - 36.0 g/dL   RDW 13.5 11.5 - 15.5 %   Platelets 204 150 - 400 K/uL   nRBC 0.0 0.0 - 0.2 %   Neutrophils Relative % 66 %   Neutro Abs 3.6 1.7 - 7.7 K/uL   Lymphocytes Relative 20 %   Lymphs Abs 1.1 0.7 - 4.0 K/uL   Monocytes Relative 11 %   Monocytes Absolute 0.6 0.1 - 1.0 K/uL   Eosinophils Relative 2 %   Eosinophils Absolute 0.1 0.0 - 0.5 K/uL   Basophils Relative 1 %   Basophils Absolute 0.1 0.0 - 0.1 K/uL   Immature Granulocytes 0 %   Abs Immature Granulocytes 0.02 0.00 - 0.07 K/uL    Comment: Performed at [facility], [address] [place], [place] [address]  Basic metabolic panel     Status: Abnormal   Collection Time: 12/18/19 11:48 AM  Result Value Ref Range   Sodium 136 135 - 145 mmol/L   Potassium 4.5 3.5 - 5.1 mmol/L    Chloride 103 98 - 111 mmol/L   CO2 25 22 - 32 mmol/L   Glucose, Bld 125 (H) 70 - 99 mg/dL    Comment: Glucose reference range applies only to samples taken after fasting for at least 8 hours.   BUN 27 (H) 8 - 23 mg/dL   Creatinine, Ser 1.52 (H) 0.61 - 1.24 mg/dL   Calcium 8.8 (L) 8.9 - 10.3 mg/dL   GFR, Estimated 41 (L) >60 mL/min    Comment: (NOTE) Calculated using the CKD-EPI Creatinine Equation (2021)    Anion gap 8 5 - 15    Comment: Performed at [facility], [address], [place], [place] [address]  Respiratory Panel by RT PCR (Flu A&B, Covid) - Nasopharyngeal Swab     Status: None   Collection Time: 12/18/19  2:03 PM   Specimen: Nasopharyngeal Swab  Result Value Ref Range   SARS Coronavirus 2 by RT PCR NEGATIVE NEGATIVE    Comment: (NOTE) SARS-CoV-2 target nucleic acids are NOT DETECTED.  The SARS-CoV-2 RNA is generally detectable in upper respiratoy specimens during the acute phase of infection. The lowest concentration of SARS-CoV-2 viral copies this assay can detect is 131 copies/mL. A negative result does not preclude SARS-Cov-2 infection and should not be used as the sole basis for treatment or other patient management decisions. A negative result may occur with  improper specimen collection/handling, submission of specimen other than nasopharyngeal swab, presence of viral mutation(s) within the areas targeted by this assay, and inadequate number of viral copies (<131 copies/mL). A negative result must be combined with clinical observations, patient history, and epidemiological information. The expected result is Negative.  Fact Sheet for Patients:  PinkCheek.be  Fact Sheet for Healthcare Providers:  GravelBags.it  This test is no t yet approved or cleared by the [place] FDA and  has been authorized for detection and/or diagnosis of SARS-CoV-2 by FDA under an Emergency Use Authorization  (EUA). This EUA will remain  in effect (meaning this test can be used) for the duration of the COVID-19 declaration under Section 564(b)(1) of the Act, 21 U.S.C. section 360bbb-3(b)(1), unless the authorization is terminated or revoked sooner.     Influenza A by PCR NEGATIVE NEGATIVE   Influenza B by PCR NEGATIVE NEGATIVE    Comment: (NOTE) The Xpert Xpress SARS-CoV-2/FLU/RSV assay is intended as an aid  in  the diagnosis of influenza from Nasopharyngeal swab specimens and  should not be used as a sole basis for treatment. Nasal washings and  aspirates are unacceptable for Xpert Xpress SARS-CoV-2/FLU/RSV  testing.  Fact Sheet for Patients: PinkCheek.be  Fact Sheet for Healthcare Providers: GravelBags.it  This test is not yet approved or cleared by the [place] FDA and  has been authorized for detection and/or diagnosis of SARS-CoV-2 by  FDA under an Emergency Use Authorization (EUA). This EUA will remain  in effect (meaning this test can be used) for the duration of the  Covid-19 declaration under Section 564(b)(1) of the Act, 21  U.S.C. section 360bbb-3(b)(1), unless the authorization is  terminated or revoked. Performed at [facility], [address] [place], [place] [address]    DG Chest 2 View  Result Date: 12/18/2019 CLINICAL DATA:  Multiple falls EXAM: CHEST - 2 VIEW COMPARISON:  03/12/2016 FINDINGS: Stable positioning of left-sided implanted cardiac device. Heart size is upper limits of normal, unchanged. Densely calcified thoracic aorta. Small left pleural effusion with associated streaky left basilar opacities. Minimally displaced lateral right fifth rib fracture. Possible nondisplaced lateral left fifth rib fracture, partially obscured by overlying pacer. No pneumothorax is seen. Mild compression deformity within the upper lumbar spine seen on lateral view. IMPRESSION: 1. Minimally displaced lateral  right fifth rib fracture. 2. Possible nondisplaced lateral left fifth rib fracture. 3. No pneumothorax. 4. Small left pleural effusion with associated streaky left basilar opacities, favor atelectasis. 5. Mild compression deformity within the upper lumbar spine seen on lateral view, age indeterminate. Electronically Signed   By: Davina Poke D.O.   On: 12/18/2019 11:09   CT CHEST ABDOMEN PELVIS W CONTRAST  Result Date: 12/18/2019 CLINICAL DATA:  Fall, rib fracture, intra-abdominal trauma, pleurisy EXAM: CT CHEST, ABDOMEN, AND PELVIS WITH CONTRAST TECHNIQUE: Multidetector CT imaging of the chest, abdomen and pelvis was performed following the standard protocol during bolus administration of intravenous contrast. CONTRAST:  50mL OMNIPAQUE IOHEXOL 300 MG/ML  SOLN COMPARISON:  07/03/2005 FINDINGS: CT CHEST FINDINGS Cardiovascular: There is extensive multi-vessel coronary artery calcification. Moderate calcification of the aortic valvular leaflets, the left ventricular outflow tract, the mitral valve leaflets and the mitral valve annulus. Mild left ventricular hypertrophy. Left subclavian pacemaker leads are seen within the right atrium and right ventricle. No pericardial effusion. The central pulmonary arteries are enlarged in keeping with changes of pulmonary arterial hypertension. There is extensive atherosclerotic calcification noted within the aortic arch and descending thoracic aorta. No aortic aneurysm. Mediastinum/Nodes: Thyroid unremarkable. No pathologic thoracic adenopathy. Esophagus unremarkable peer Lungs/Pleura: Mild bibasilar interstitial fibrosis. Small left pleural effusion with associated left basilar atelectasis. No pneumothorax. Central airways are widely patent. Musculoskeletal: There are acute fractures of the left 4-10 ribs laterally with additional fractures of ribs 8-10 posteriorly. This may result in a free-floating segment of the chest wall in keeping with a flail chest. There is  associated soft tissue swelling involving the lateral thoracic wall surrounding the rib fractures. CT ABDOMEN PELVIS FINDINGS Hepatobiliary: No focal liver abnormality is seen. No gallstones, gallbladder wall thickening, or biliary dilatation. Pancreas: Unremarkable Spleen: Unremarkable Adrenals/Urinary Tract: Severe left renal atrophy, possibly related to a chronic left UPJ obstruction. Mild right renal cortical atrophy. Multiple simple cortical cyst noted within the right kidney. No hydronephrosis on the right. No intrarenal enhancing masses or calcifications identified. The adrenal glands are unremarkable. Surgical changes of cystectomy and ileal conduit formation identified with the ostomy noted within the right lower  quadrant. Stomach/Bowel: Stomach and small bowel are unremarkable. Moderate descending and severe sigmoid colonic diverticulosis. The large bowel is otherwise unremarkable. Appendix unremarkable. No free intraperitoneal gas or fluid peer Vascular/Lymphatic: Extensive atherosclerotic calcification within the abdominal aorta and iliofemoral vasculature. Probable hemodynamically significant stenoses of the mesenteric arterial vasculature and right common iliac artery origin, not well characterized on this non arteriographic study. No aortic aneurysm. No pathologic adenopathy within the abdomen and pelvis. Reproductive: Status post hysterectomy. No adnexal masses. Other: Mild gaseous distention of the rectum without surrounding inflammatory change. Musculoskeletal: Right femoral neck ORIF utilizing 3 axial screws noted. Remote fractures of the left superior and inferior pubic rami are noted. No acute fracture of the osseous structures of the abdomen and pelvis. Remote compression fracture of L1 and L2 noted. Advanced degenerative changes noted within the lumbar spine. IMPRESSION: Acute fractures of the left 4-10 ribs with fractures of the left 8-10 ribs in 2 locations possibly resulting in flail chest  physiology. Associated small left pleural effusion and left basilar atelectasis. No pneumothorax. Extensive coronary artery calcification. Extensive calcification of the aortic and mitral valves may indicate underlying valvular dysfunction. This would be better assessed with echocardiography if indicated. Mild left ventricular hypertrophy noted. Peripheral vascular disease. Possible hemodynamically significant stenoses of the mesenteric arteries and right lower extremity inflow. If indicated, this would better assessed with CT arteriography. No acute intra-abdominal injury. Aortic Atherosclerosis (ICD10-I70.0). Electronically Signed   By: Fidela Salisbury MD   On: 12/18/2019 13:25    ROS  Blood pressure 133/72, pulse 93, resp. rate 18.  Physical Examination:  General appearance - alert, well appearing, and in no distress, elderly Mental status - alert, oriented to person, place, and time Eyes - pupils equal and reactive, extraocular eye movements intact Neck - No C spine tenderness or stepoffs, supple, no significant adenopathy Chest - tender to palpation on left, clear to auscultation, no wheezes, rales or rhonchi, symmetric air entry Abdomen - soft, nontender, nondistended, ostomy present, pink, midline incision well healed GU - No external blood or traumatic lesions Rectal - deferred, not clinically indicated Back exam - No T/L/S spine tenderness or stepoffs, no traumatic lesions Extremities - - R upper: no obvious deformities, sensorimotor intact, 2+ pulse - L upper: obvious deformity at wrist however this is chronic per patient since 1950's, sensorimotor intact, 2+ pulse - R lower: no obvious deformities, sensorimotor intact, 2+ pulse, chronic muscle atrophy - L lower:  no obvious deformities, sensorimotor intact, 2+ pulse, chronic muscle atrophy Skin - warm, <2 second cap refill, skin hematomas - chronic - throughout all extremities    Assessment/Plan Mr. Swingler is a 86 yo M who  presented as a trauma transfer admitted to the medicine service due to multiple medical comorbidities and advanced age, who presents with rib fractures s/p fall from standing.  - Appreciate excellent care per medical team - Multimodal pain regimen including tylenol, opiates, robaxin - Incentive spirometry 10x an hour while awake - PT/OT - AM CXR - Thank you for this consult, trauma will continue to follow  Ahmed Prima, MD [facility] PGY 5  General Surgery  This consult was seen in conjunction with the attending physician. Assessment and plan as stated unless otherwise noted in attending addendum.

## 2019-12-18 NOTE — ED Notes (Signed)
Pt placed on 2L [place] for comfort per Dr Tamala Julian

## 2019-12-18 NOTE — ED Notes (Signed)
Report given to [facility] at this time.   

## 2019-12-18 NOTE — ED Notes (Signed)
[place] spoke to [place], she state Dr. Laney [facility] said they were unable to take this patient at this time,  maybe try much later [id number]

## 2019-12-18 NOTE — ED Notes (Signed)
Pt given sandwich tray 

## 2019-12-18 NOTE — ED Notes (Signed)
EMTALA checked from completion

## 2019-12-19 ENCOUNTER — Inpatient Hospital Stay (HOSPITAL_COMMUNITY): Payer: Medicare HMO

## 2019-12-19 DIAGNOSIS — I2510 Atherosclerotic heart disease of native coronary artery without angina pectoris: Secondary | ICD-10-CM

## 2019-12-19 DIAGNOSIS — E785 Hyperlipidemia, unspecified: Secondary | ICD-10-CM

## 2019-12-19 DIAGNOSIS — S2249XS Multiple fractures of ribs, unspecified side, sequela: Secondary | ICD-10-CM

## 2019-12-19 LAB — BASIC METABOLIC PANEL
Anion gap: 8 (ref 5–15)
BUN: 28 mg/dL — ABNORMAL HIGH (ref 8–23)
CO2: 26 mmol/L (ref 22–32)
Calcium: 8.7 mg/dL — ABNORMAL LOW (ref 8.9–10.3)
Chloride: 104 mmol/L (ref 98–111)
Creatinine, Ser: 1.63 mg/dL — ABNORMAL HIGH (ref 0.61–1.24)
GFR, Estimated: 38 mL/min — ABNORMAL LOW (ref >60)
Glucose, Bld: 99 mg/dL (ref 70–99)
Potassium: 4.6 mmol/L (ref 3.5–5.1)
Sodium: 138 mmol/L (ref 135–145)

## 2019-12-19 LAB — CBC
HCT: 31.5 % — ABNORMAL LOW (ref 39.0–52.0)
Hemoglobin: 9.9 g/dL — ABNORMAL LOW (ref 13.0–17.0)
MCH: 30.4 pg (ref 26.0–34.0)
MCHC: 31.4 g/dL (ref 30.0–36.0)
MCV: 96.6 fL (ref 80.0–100.0)
Platelets: 211 10*3/uL (ref 150–400)
RBC: 3.26 MIL/uL — ABNORMAL LOW (ref 4.22–5.81)
RDW: 13.7 % (ref 11.5–15.5)
WBC: 5.2 10*3/uL (ref 4.0–10.5)
nRBC: 0 % (ref 0.0–0.2)

## 2019-12-19 LAB — GLUCOSE, CAPILLARY: Glucose-Capillary: 213 mg/dL — ABNORMAL HIGH (ref 70–99)

## 2019-12-19 MED ORDER — INSULIN ASPART 100 UNIT/ML ~~LOC~~ SOLN
0.0000 [IU] | Freq: Three times a day (TID) | SUBCUTANEOUS | Status: DC
  Administered 2019-12-19: 3 [IU] via SUBCUTANEOUS
  Administered 2019-12-20: 2 [IU] via SUBCUTANEOUS
  Administered 2019-12-21: 1 [IU] via SUBCUTANEOUS
  Administered 2019-12-21 – 2019-12-22 (×2): 2 [IU] via SUBCUTANEOUS

## 2019-12-19 MED ORDER — ENOXAPARIN SODIUM 40 MG/0.4ML ~~LOC~~ SOLN: 40.0000 mg | SUBCUTANEOUS | Status: DC

## 2019-12-19 MED ORDER — ASPIRIN EC 81 MG PO TBEC
81.0000 mg | DELAYED_RELEASE_TABLET | Freq: Every day | ORAL | Status: DC
  Administered 2019-12-19 – 2019-12-22 (×4): 81 mg via ORAL
  Filled 2019-12-19 (×4): qty 1

## 2019-12-19 MED ORDER — ENOXAPARIN SODIUM 30 MG/0.3ML ~~LOC~~ SOLN
30.0000 mg | SUBCUTANEOUS | Status: DC
  Administered 2019-12-19 – 2019-12-21 (×3): 30 mg via SUBCUTANEOUS
  Filled 2019-12-19 (×3): qty 0.3

## 2019-12-19 NOTE — Progress Notes (Signed)
Occupational Therapy Evaluation Patient Details Name: Vincent Jackson MRN: [id number] DOB: [birth year] Today's Date: 12/19/2019    History of Present Illness 86 y.o. male with medical history significant of CAD status post CABG x3 in 1998 and subsequent stenting, sick sinus syndrome status post pacemaker, hypertension, hyperlipidemia, diabetes, CHF, COPD, CKD, aortic stenosis who presented following a fall. CT chest abdomen pelvis showed fractures of the left fourth rib 10 ribs and fractures of left 8 through 10 ribs in 2 locations    Clinical Impression   PTA, pt living at [facility] in [place] and was modified independent with mobility and ADL @ cane/roolator level. Pt has had several recent falls. Pt currently min A with mobility and ADL tasks. Able to ambulate with min A for safety on RA with VSS. Pt will benefit from rehab at his SNF to maximize functional level of independence. Pt will most likely be more appropriate for ALF level after rehab. Will follow acutely to facilitate safe DC to SNF.     Follow Up Recommendations  SNF;Supervision/Assistance - 24 hour    Equipment Recommendations  None recommended by OT    Recommendations for Other Services       Precautions / Restrictions Precautions Precautions: Fall      Mobility Bed Mobility Overal bed mobility: Needs Assistance Bed Mobility: Supine to Sit     Supine to sit: Min assist          Transfers Overall transfer level: Needs assistance   Transfers: Sit to/from Stand Sit to Stand: Min guard         General transfer comment: "hands off when I stand"; "Hands on" after he stands up    Balance                                           ADL either performed or assessed with clinical judgement   ADL Overall ADL's : Needs assistance/impaired     Grooming: Set up;Sitting   Upper Body Bathing: Set up;Sitting   Lower Body Bathing: Minimal assistance;Sit to/from  stand   Upper Body Dressing : Minimal assistance;Sitting Upper Body Dressing Details (indicate cue type and reason): manages his own urostomy bag Lower Body Dressing: Minimal assistance;Sit to/from stand   Toilet Transfer: Minimal assistance;Ambulation Toilet Transfer Details (indicate cue type and reason): for steady assist         Functional mobility during ADLs: Minimal assistance;Rolling walker;Cueing for safety General ADL Comments: limited by pain     Vision         Perception     Praxis      Pertinent Vitals/Pain Pain Assessment: Faces Faces Pain Scale: Hurts even more Pain Location: L ribs/shoulder Pain Descriptors / Indicators: Aching;Discomfort;Grimacing;Guarding Pain Intervention(s): Limited activity within patient's tolerance     Hand Dominance Right   Extremity/Trunk Assessment Upper Extremity Assessment Upper Extremity Assessment: Generalized weakness (B hand contractures; functional/compensates)   Lower Extremity Assessment Lower Extremity Assessment: Defer to PT evaluation   Cervical / Trunk Assessment Cervical / Trunk Assessment: Kyphotic   Communication Communication Communication: No difficulties   Cognition Arousal/Alertness: Awake/alert Behavior During Therapy: WFL for tasks assessed/performed Overall Cognitive Status: Within Functional Limits for tasks assessed  Decreased awareness of safety, which is most likely baseline  General Comments: most likely at his baseline   General Comments  VSS on RA    Exercises Exercises: Other exercises Other Exercises Other Exercises: Incentive spirometer x 5 - able to pull 761ml   Shoulder Instructions      Home Living Family/patient expects to be discharged to:: Skilled nursing facility                                        Prior Functioning/Environment Level of Independence: Independent with assistive device(s) (rollator/cane)         Comments: Living in Independent Living apt at [facility] in [place]        OT Problem List: Decreased strength;Decreased activity tolerance;Impaired balance (sitting and/or standing);Decreased safety awareness;Decreased knowledge of use of DME or AE;Pain      OT Treatment/Interventions: Self-care/ADL training;Therapeutic exercise;DME and/or AE instruction;Therapeutic activities;Patient/family education;Balance training    OT Goals(Current goals can be found in the care plan section) Acute Rehab OT Goals Patient Stated Goal: to get back to [facility] OT Goal Formulation: With patient/family Time For Goal Achievement: 01/02/20 Potential to Achieve Goals: Good  OT Frequency: Min 2X/week   Barriers to D/C:            Co-evaluation PT/OT/SLP Co-Evaluation/Treatment: Yes Reason for Co-Treatment: For patient/therapist safety   OT goals addressed during session: ADL's and self-care      AM-PAC OT "6 Clicks" Daily Activity     Outcome Measure Help from another person eating meals?: None Help from another person taking care of personal grooming?: A Little Help from another person toileting, which includes using toliet, bedpan, or urinal?: A Little Help from another person bathing (including washing, rinsing, drying)?: A Little Help from another person to put on and taking off regular upper body clothing?: A Little Help from another person to put on and taking off regular lower body clothing?: A Little 6 Click Score: 19   End of Session Equipment Utilized During Treatment: Gait belt;Rolling walker Nurse Communication: Mobility status  Activity Tolerance: Patient tolerated treatment well Patient left: in chair;with call bell/phone within reach;with chair alarm set;with family/visitor present  OT Visit Diagnosis: Unsteadiness on feet (R26.81);Repeated falls (R29.6);Muscle weakness (generalized) (M62.81);Pain Pain - Right/Left: Left Pain - part of body: Shoulder (ribs)                 Time: 7092-9574 OT Time Calculation (min): 34 min Charges:  OT General Charges $OT Visit: 1 Visit OT Evaluation $OT Eval Low Complexity: [place], OT/L   Acute OT Clinical Specialist Marlin Pager [phone] Office [phone]   [facility] 12/19/2019, 11:30 AM

## 2019-12-19 NOTE — Evaluation (Signed)
Physical Therapy Evaluation Patient Details Name: Vincent Jackson MRN: [id number] DOB: December 07, 1919 Today's Date: 12/19/2019   History of Present Illness  86 y.o. male with medical history significant of CAD status post CABG x3 in 1998 and subsequent stenting, sick sinus syndrome status post pacemaker, hypertension, hyperlipidemia, diabetes, CHF, COPD, CKD, aortic stenosis who presented following a fall. CT chest abdomen pelvis showed fractures of the left fourth rib 10 ribs and fractures of left 8 through 10 ribs in 2 locations   Clinical Impression  PTA pt living in [facility] with his wife. Over last couple of days, pt's wife has been in SNF and pt has experienced 3x falls to his L side. Pt reports prior to falls he was able to ambulate limited community distances with Rollator and perform his ADLs. Pt is min A for mobility and would benefit from short bout of SNF level rehab to improve balance and mobility prior to return to his ALF arrangement. PT will continue to follow acutely to facilitate mobility until transition to SNF.      Follow Up Recommendations SNF    Equipment Recommendations  None recommended by PT       Precautions / Restrictions Precautions Precautions: Fall Restrictions Weight Bearing Restrictions: No      Mobility  Bed Mobility Overal bed mobility: Needs Assistance Bed Mobility: Supine to Sit     Supine to sit: Min assist          Transfers Overall transfer level: Needs assistance   Transfers: Sit to/from Stand Sit to Stand: Min guard         General transfer comment: "hands off when I stand"; "Hands on" after he stands up  Ambulation/Gait Ambulation/Gait assistance: Min assist Gait Distance (Feet): 80 Feet Assistive device: Rolling walker (2 wheeled) Gait Pattern/deviations: Step-through pattern Gait velocity: variable   General Gait Details: light minA for ambulation, wears slippers with decreased traction and as ambulation  progresses pt with increasingly shuffled gait         Balance Overall balance assessment: Mild deficits observed, not formally tested                                           Pertinent Vitals/Pain Pain Assessment: Faces Faces Pain Scale: Hurts even more Pain Location: L ribs/shoulder Pain Descriptors / Indicators: Aching;Discomfort;Grimacing;Guarding Pain Intervention(s): Limited activity within patient's tolerance;Monitored during session;Repositioned    Home Living Family/patient expects to be discharged to:: Skilled nursing facility                      Prior Function Level of Independence: Independent with assistive device(s) (rollator/cane)         Comments: Living in Independent Living apt at [facility] in [place] Hand: Right    Extremity/Trunk Assessment   Upper Extremity Assessment Upper Extremity Assessment: Defer to OT evaluation    Lower Extremity Assessment Lower Extremity Assessment: Generalized weakness    Cervical / Trunk Assessment Cervical / Trunk Assessment: Kyphotic  Communication   Communication: No difficulties  Cognition Arousal/Alertness: Awake/alert Behavior During Therapy: WFL for tasks assessed/performed Overall Cognitive Status: Within Functional Limits for tasks assessed  General Comments: most likely at his baseline      General Comments General comments (skin integrity, edema, etc.): VSS on RA    Exercises Other Exercises Other Exercises: Incentive spirometer x 5 - ablet o pull 780ml   Assessment/Plan    PT Assessment Patient needs continued PT services  PT Problem List Decreased safety awareness;Pain;Decreased mobility;Decreased strength;Decreased activity tolerance       PT Treatment Interventions DME instruction;Gait training;Functional mobility training;Therapeutic activities;Therapeutic exercise;Balance  training;Cognitive remediation;Patient/family education    PT Goals (Current goals can be found in the Care Plan section)  Acute Rehab PT Goals Patient Stated Goal: to get back to [facility] PT Goal Formulation: With patient/family Time For Goal Achievement: 01/02/20 Potential to Achieve Goals: Good    Frequency Min 2X/week        Co-evaluation PT/OT/SLP Co-Evaluation/Treatment: Yes Reason for Co-Treatment: For patient/therapist safety PT goals addressed during session: Mobility/safety with mobility OT goals addressed during session: ADL's and self-care       AM-PAC PT "6 Clicks" Mobility  Outcome Measure Help needed turning from your back to your side while in a flat bed without using bedrails?: None Help needed moving from lying on your back to sitting on the side of a flat bed without using bedrails?: None Help needed moving to and from a bed to a chair (including a wheelchair)?: None Help needed standing up from a chair using your arms (e.g., wheelchair or bedside chair)?: None Help needed to walk in hospital room?: A Little Help needed climbing 3-5 steps with a railing? : A Little 6 Click Score: 22    End of Session Equipment Utilized During Treatment: Gait belt Activity Tolerance: Patient tolerated treatment well Patient left: in chair;with call bell/phone within reach;with chair alarm set;with family/visitor present Nurse Communication: Mobility status PT Visit Diagnosis: Unsteadiness on feet (R26.81);Repeated falls (R29.6);History of falling (Z91.81)    Time: 0981-1914 PT Time Calculation (min) (ACUTE ONLY): 34 min   Charges:   PT Evaluation $PT Eval Moderate Complexity: 1 Mod          Gurjit Loconte B. Migdalia Dk PT, DPT Acute Rehabilitation Services Pager [phone] Office [phone]   [place] 12/19/2019, 2:12 PM

## 2019-12-19 NOTE — Progress Notes (Addendum)
   Patient admitted to the floor from OSH, alert and oriented times four, oriented to room/unit policies, admission questions completed, skin assessment completed by 2 RNs.  Patient has a urostomy on admission to the hospital, draining yellow light, foul smelling urine, Urostomy attached to standard drainage bag. Patient instructed to call for assist before getting OOB, all questions answered with patient verbalizing understanding, bed low and locked call bell within reach.

## 2019-12-19 NOTE — Progress Notes (Signed)
PROGRESS NOTE        PATIENT DETAILS Name: Vincent Jackson Age: 86 y.o. Sex: male Date of Birth: [birth year] Admit Date: 12/18/2019 Admitting Physician Marcelyn Bruins, MD [id number], Christean Grief, MD  Brief Narrative: Patient is a 86 y.o. male with history of CAD s/p CABG in 1998, sick sinus syndrome s/p PPM, HTN, HLD, chronic diastolic heart failure, COPD, CKD stage IV-presenting with fall and multiple rib fractures-transfer from [facility] to [facility] for concern of flail chest on trauma evaluation.  Significant events: 10/22>> admit-multiple rib fractures-concern for flail chest  Significant studies: 10/22>> CT chest/abdomen/pelvis: Acute rib fractures left 4-10 ribs, 8-10 ribs in 2 locations possibly resulting in flail chest.  Antimicrobial therapy: None  Microbiology data: None  Procedures : None  Consults: Trauma  DVT Prophylaxis : SCDs Start: 12/18/19 2226  Start prophylactic Lovenox   Subjective: Lying comfortably in bed-no chest pain unless he lies on his chest.  Assessment/Plan: Multiple rib fractures-concern for flail chest: Trauma service following-continue care per trauma services.   Encourage incentive spirometry  Falls: Multiple falls in the past 1 month-3 episodes per patient-await PT/OT evaluation-lives at ALF-May need SNF.  History of CAD s/p CABG 1998-subsequent PCI's/sick sinus syndrome SP pacemaker placement: Continue telemetry monitoring-resume aspirin-continue statin and metoprolol.  CKD stage IV: Creatinine close to baseline  DM-2: Hold glipizide-start SSI and follow CBGs.  No results for input(s): GLUCAP in the last 72 hours.  Deconditioning/debility: Await PT/OT eval.  Diet: Diet Order            Diet heart healthy/carb modified Room service appropriate? Yes; Fluid consistency: Thin  Diet effective now                  Code Status: DNR  Family Communication: Daughter-Jane [phone] over the phone  10/23  Disposition Plan: Status is: Inpatient  Remains inpatient appropriate because:Inpatient level of care appropriate due to severity of illness  Dispo: The patient is from: ALF              Anticipated d/c is to: SNF              Anticipated d/c date is: 3 days              Patient currently is not medically stable to d/c.   Barriers to Discharge:  Antimicrobial agents: Anti-infectives (From admission, onward)   None       Time spent: 25-minutes-Greater than 50% of this time was spent in counseling, explanation of diagnosis, planning of further management, and coordination of care.  MEDICATIONS: Scheduled Meds: . glipiZIDE  10 mg Oral Q breakfast  . latanoprost  1 drop Both Eyes QHS  . lidocaine  1 patch Transdermal Q24H  . metoprolol succinate  25 mg Oral Daily  . multivitamin with minerals  1 tablet Oral Daily  . pantoprazole  40 mg Oral Daily  . potassium chloride  10 mEq Oral Daily  . simvastatin  40 mg Oral Daily  . sodium chloride flush  3 mL Intravenous Q12H  . vitamin B-12  500 mcg Oral Daily   Continuous Infusions: . methocarbamol (ROBAXIN) IV     PRN Meds:.acetaminophen **OR** acetaminophen, methocarbamol (ROBAXIN) IV   PHYSICAL EXAM: Vital signs: Vitals:   12/18/19 2121 12/19/19 0005 12/19/19 0246  BP: 133/72 111/78 130/67  Pulse: 93  Resp: 18 13   Temp:   98.7 F (37.1 C)  TempSrc:   Oral  Weight:   63.4 kg   Filed Weights   12/19/19 0246  Weight: 63.4 kg   Body mass index is 18.96 kg/m.   Gen Exam:Alert awake-not in any distress HEENT:atraumatic, normocephalic Chest: B/L clear to auscultation anteriorly CVS:S1S2 regular Abdomen:soft non tender, non distended Extremities:no edema Neurology: Non focal Skin: no rash  I have personally reviewed following labs and imaging studies  LABORATORY DATA: CBC: Recent Labs  Lab 12/18/19 1148 12/19/19 0313  WBC 5.4 5.2  NEUTROABS 3.6  --   HGB 11.0* 9.9*  HCT 34.2* 31.5*  MCV  95.3 96.6  PLT 204 175    Basic Metabolic Panel: Recent Labs  Lab 12/18/19 1148 12/19/19 0313  NA 136 138  K 4.5 4.6  CL 103 104  CO2 25 26  GLUCOSE 125* 99  BUN 27* 28*  CREATININE 1.52* 1.63*  CALCIUM 8.8* 8.7*    GFR: CrCl cannot be calculated (Unknown ideal weight.).  Liver Function Tests: No results for input(s): AST, ALT, ALKPHOS, BILITOT, PROT, ALBUMIN in the last 168 hours. No results for input(s): LIPASE, AMYLASE in the last 168 hours. No results for input(s): AMMONIA in the last 168 hours.  Coagulation Profile: No results for input(s): INR, PROTIME in the last 168 hours.  Cardiac Enzymes: No results for input(s): CKTOTAL, CKMB, CKMBINDEX, TROPONINI in the last 168 hours.  BNP (last 3 results) No results for input(s): PROBNP in the last 8760 hours.  Lipid Profile: No results for input(s): CHOL, HDL, LDLCALC, TRIG, CHOLHDL, LDLDIRECT in the last 72 hours.  Thyroid Function Tests: No results for input(s): TSH, T4TOTAL, FREET4, T3FREE, THYROIDAB in the last 72 hours.  Anemia Panel: No results for input(s): VITAMINB12, FOLATE, FERRITIN, TIBC, IRON, RETICCTPCT in the last 72 hours.  Urine analysis:    Component Value Date/Time   COLORURINE YELLOW (A) 03/12/2016 1152   APPEARANCEUR HAZY (A) 03/12/2016 1152   APPEARANCEUR Cloudy 11/23/2013 0251   LABSPEC 1.011 03/12/2016 1152   LABSPEC 1.009 11/23/2013 0251   PHURINE 9.0 (H) 03/12/2016 1152   GLUCOSEU NEGATIVE 03/12/2016 1152   GLUCOSEU Negative 11/23/2013 0251   HGBUR NEGATIVE 03/12/2016 1152   BILIRUBINUR NEGATIVE 03/12/2016 1152   BILIRUBINUR Negative 11/23/2013 0251   KETONESUR NEGATIVE 03/12/2016 1152   PROTEINUR 30 (A) 03/12/2016 1152   NITRITE POSITIVE (A) 03/12/2016 1152   LEUKOCYTESUR NEGATIVE 03/12/2016 1152   LEUKOCYTESUR Trace 11/23/2013 0251    Sepsis Labs: Lactic Acid, Venous No results found for: LATICACIDVEN  MICROBIOLOGY: Recent Results (from the past 240 hour(s))  Respiratory  Panel by RT PCR (Flu A&B, Covid) - Nasopharyngeal Swab     Status: None   Collection Time: 12/18/19  2:03 PM   Specimen: Nasopharyngeal Swab  Result Value Ref Range Status   SARS Coronavirus 2 by RT PCR NEGATIVE NEGATIVE Final    Comment: (NOTE) SARS-CoV-2 target nucleic acids are NOT DETECTED.  The SARS-CoV-2 RNA is generally detectable in upper respiratoy specimens during the acute phase of infection. The lowest concentration of SARS-CoV-2 viral copies this assay can detect is 131 copies/mL. A negative result does not preclude SARS-Cov-2 infection and should not be used as the sole basis for treatment or other patient management decisions. A negative result may occur with  improper specimen collection/handling, submission of specimen other than nasopharyngeal swab, presence of viral mutation(s) within the areas targeted by this assay, and inadequate number of viral  copies (<131 copies/mL). A negative result must be combined with clinical observations, patient history, and epidemiological information. The expected result is Negative.  Fact Sheet for Patients:  PinkCheek.be  Fact Sheet for Healthcare Providers:  GravelBags.it  This test is no t yet approved or cleared by the [place] FDA and  has been authorized for detection and/or diagnosis of SARS-CoV-2 by FDA under an Emergency Use Authorization (EUA). This EUA will remain  in effect (meaning this test can be used) for the duration of the COVID-19 declaration under Section 564(b)(1) of the Act, 21 U.S.C. section 360bbb-3(b)(1), unless the authorization is terminated or revoked sooner.     Influenza A by PCR NEGATIVE NEGATIVE Final   Influenza B by PCR NEGATIVE NEGATIVE Final    Comment: (NOTE) The Xpert Xpress SARS-CoV-2/FLU/RSV assay is intended as an aid in  the diagnosis of influenza from Nasopharyngeal swab specimens and  should not be used as a sole basis  for treatment. Nasal washings and  aspirates are unacceptable for Xpert Xpress SARS-CoV-2/FLU/RSV  testing.  Fact Sheet for Patients: PinkCheek.be  Fact Sheet for Healthcare Providers: GravelBags.it  This test is not yet approved or cleared by the [place] FDA and  has been authorized for detection and/or diagnosis of SARS-CoV-2 by  FDA under an Emergency Use Authorization (EUA). This EUA will remain  in effect (meaning this test can be used) for the duration of the  Covid-19 declaration under Section 564(b)(1) of the Act, 21  U.S.C. section 360bbb-3(b)(1), unless the authorization is  terminated or revoked. Performed at [facility], [address] [place], [place] [address]     RADIOLOGY STUDIES/RESULTS: DG Chest 2 View  Result Date: 12/18/2019 CLINICAL DATA:  Multiple falls EXAM: CHEST - 2 VIEW COMPARISON:  03/12/2016 FINDINGS: Stable positioning of left-sided implanted cardiac device. Heart size is upper limits of normal, unchanged. Densely calcified thoracic aorta. Small left pleural effusion with associated streaky left basilar opacities. Minimally displaced lateral right fifth rib fracture. Possible nondisplaced lateral left fifth rib fracture, partially obscured by overlying pacer. No pneumothorax is seen. Mild compression deformity within the upper lumbar spine seen on lateral view. IMPRESSION: 1. Minimally displaced lateral right fifth rib fracture. 2. Possible nondisplaced lateral left fifth rib fracture. 3. No pneumothorax. 4. Small left pleural effusion with associated streaky left basilar opacities, favor atelectasis. 5. Mild compression deformity within the upper lumbar spine seen on lateral view, age indeterminate. Electronically Signed   By: Davina Poke D.O.   On: 12/18/2019 11:09   CT CHEST ABDOMEN PELVIS W CONTRAST  Result Date: 12/18/2019 CLINICAL DATA:  Fall, rib fracture, intra-abdominal  trauma, pleurisy EXAM: CT CHEST, ABDOMEN, AND PELVIS WITH CONTRAST TECHNIQUE: Multidetector CT imaging of the chest, abdomen and pelvis was performed following the standard protocol during bolus administration of intravenous contrast. CONTRAST:  46mL OMNIPAQUE IOHEXOL 300 MG/ML  SOLN COMPARISON:  07/03/2005 FINDINGS: CT CHEST FINDINGS Cardiovascular: There is extensive multi-vessel coronary artery calcification. Moderate calcification of the aortic valvular leaflets, the left ventricular outflow tract, the mitral valve leaflets and the mitral valve annulus. Mild left ventricular hypertrophy. Left subclavian pacemaker leads are seen within the right atrium and right ventricle. No pericardial effusion. The central pulmonary arteries are enlarged in keeping with changes of pulmonary arterial hypertension. There is extensive atherosclerotic calcification noted within the aortic arch and descending thoracic aorta. No aortic aneurysm. Mediastinum/Nodes: Thyroid unremarkable. No pathologic thoracic adenopathy. Esophagus unremarkable peer Lungs/Pleura: Mild bibasilar interstitial fibrosis. Small left pleural effusion  with associated left basilar atelectasis. No pneumothorax. Central airways are widely patent. Musculoskeletal: There are acute fractures of the left 4-10 ribs laterally with additional fractures of ribs 8-10 posteriorly. This may result in a free-floating segment of the chest wall in keeping with a flail chest. There is associated soft tissue swelling involving the lateral thoracic wall surrounding the rib fractures. CT ABDOMEN PELVIS FINDINGS Hepatobiliary: No focal liver abnormality is seen. No gallstones, gallbladder wall thickening, or biliary dilatation. Pancreas: Unremarkable Spleen: Unremarkable Adrenals/Urinary Tract: Severe left renal atrophy, possibly related to a chronic left UPJ obstruction. Mild right renal cortical atrophy. Multiple simple cortical cyst noted within the right kidney. No  hydronephrosis on the right. No intrarenal enhancing masses or calcifications identified. The adrenal glands are unremarkable. Surgical changes of cystectomy and ileal conduit formation identified with the ostomy noted within the right lower quadrant. Stomach/Bowel: Stomach and small bowel are unremarkable. Moderate descending and severe sigmoid colonic diverticulosis. The large bowel is otherwise unremarkable. Appendix unremarkable. No free intraperitoneal gas or fluid peer Vascular/Lymphatic: Extensive atherosclerotic calcification within the abdominal aorta and iliofemoral vasculature. Probable hemodynamically significant stenoses of the mesenteric arterial vasculature and right common iliac artery origin, not well characterized on this non arteriographic study. No aortic aneurysm. No pathologic adenopathy within the abdomen and pelvis. Reproductive: Status post hysterectomy. No adnexal masses. Other: Mild gaseous distention of the rectum without surrounding inflammatory change. Musculoskeletal: Right femoral neck ORIF utilizing 3 axial screws noted. Remote fractures of the left superior and inferior pubic rami are noted. No acute fracture of the osseous structures of the abdomen and pelvis. Remote compression fracture of L1 and L2 noted. Advanced degenerative changes noted within the lumbar spine. IMPRESSION: Acute fractures of the left 4-10 ribs with fractures of the left 8-10 ribs in 2 locations possibly resulting in flail chest physiology. Associated small left pleural effusion and left basilar atelectasis. No pneumothorax. Extensive coronary artery calcification. Extensive calcification of the aortic and mitral valves may indicate underlying valvular dysfunction. This would be better assessed with echocardiography if indicated. Mild left ventricular hypertrophy noted. Peripheral vascular disease. Possible hemodynamically significant stenoses of the mesenteric arteries and right lower extremity inflow. If  indicated, this would better assessed with CT arteriography. No acute intra-abdominal injury. Aortic Atherosclerosis (ICD10-I70.0). Electronically Signed   By: Fidela Salisbury MD   On: 12/18/2019 13:25   DG CHEST PORT 1 VIEW  Result Date: 12/19/2019 CLINICAL DATA:  Rib fractures EXAM: PORTABLE CHEST 1 VIEW COMPARISON:  Yesterday FINDINGS: Atelectasis and small pleural effusion at the left base without interval change. No apical pneumothorax or convincing basal pneumothorax. Known left rib fractures. Normal heart size and stable aortic contours. Dual-chamber pacer leads from the left. IMPRESSION: Stable atelectasis and pleural fluid on the left. Electronically Signed   By: Monte Fantasia M.D.   On: 12/19/2019 09:51     LOS: 1 day   Oren Binet, MD  [facility]    To contact the attending provider between 7A-7P or the covering provider during after hours 7P-7A, please log into the web site [email or web address] and access using universal [place] password for that web site. If you do not have the password, please call the hospital operator.  12/19/2019, 11:15 AM

## 2019-12-19 NOTE — Progress Notes (Signed)
Subjective: Patient having pain on left side, but ate well this am.  No other complaints.    ROS: See above, otherwise other systems negative  Objective: Vital signs in last 24 hours: Temp:  [98 F (36.7 C)-98.7 F (37.1 C)] 98.7 F (37.1 C) (10/23 0246) Pulse Rate:  [87-100] 93 (10/22 2121) Resp:  [13-52] 13 (10/23 0005) BP: (111-161)/(65-111) 130/67 (10/23 0246) SpO2:  [93 %-96 %] 96 % (10/22 2021) Weight:  [63.4 kg] 63.4 kg (10/23 0246)    Intake/Output from previous day: 10/22 0701 - 10/23 0700 In: -  Out: 350 [Urine:350] Intake/Output this shift: No intake/output data recorded.  PE: Gen: NAD Heart: regular Lungs: CTAB, tender over left chest wall with ecchymosis over left chest Abd: soft, NT, ND  Lab Results:  Recent Labs    12/18/19 1148 12/19/19 0313  WBC 5.4 5.2  HGB 11.0* 9.9*  HCT 34.2* 31.5*  PLT 204 211   BMET Recent Labs    12/18/19 1148 12/19/19 0313  NA 136 138  K 4.5 4.6  CL 103 104  CO2 25 26  GLUCOSE 125* 99  BUN 27* 28*  CREATININE 1.52* 1.63*  CALCIUM 8.8* 8.7*   PT/INR No results for input(s): LABPROT, INR in the last 72 hours. CMP     Component Value Date/Time   NA 138 12/19/2019 0313   NA 136 12/05/2013 0427   K 4.6 12/19/2019 0313   K 4.6 12/05/2013 0427   CL 104 12/19/2019 0313   CL 102 12/05/2013 0427   CO2 26 12/19/2019 0313   CO2 28 12/05/2013 0427   GLUCOSE 99 12/19/2019 0313   GLUCOSE 135 (H) 12/05/2013 0427   BUN 28 (H) 12/19/2019 0313   BUN 36 (H) 12/05/2013 0427   CREATININE 1.63 (H) 12/19/2019 0313   CREATININE 1.60 (H) 12/05/2013 0427   CALCIUM 8.7 (L) 12/19/2019 0313   CALCIUM 8.1 (L) 12/05/2013 0427   PROT 6.7 04/16/2016 1828   PROT 6.2 (L) 12/05/2013 0427   ALBUMIN 3.9 04/16/2016 1828   ALBUMIN 3.0 (L) 12/05/2013 0427   AST 30 04/16/2016 1828   AST 28 12/05/2013 0427   ALT 17 04/16/2016 1828   ALT 21 12/05/2013 0427   ALKPHOS 49 04/16/2016 1828   ALKPHOS 68 12/05/2013 0427   BILITOT  0.6 04/16/2016 1828   BILITOT 0.3 12/05/2013 0427   GFRNONAA 38 (L) 12/19/2019 0313   GFRNONAA 43 (L) 12/05/2013 0427   GFRNONAA 43 (L) 10/07/2011 2059   GFRAA 48 (L) 04/17/2016 0106   GFRAA 52 (L) 12/05/2013 0427   GFRAA 50 (L) 10/07/2011 2059   Lipase  No results found for: LIPASE     Studies/Results: DG Chest 2 View  Result Date: 12/18/2019 CLINICAL DATA:  Multiple falls EXAM: CHEST - 2 VIEW COMPARISON:  03/12/2016 FINDINGS: Stable positioning of left-sided implanted cardiac device. Heart size is upper limits of normal, unchanged. Densely calcified thoracic aorta. Small left pleural effusion with associated streaky left basilar opacities. Minimally displaced lateral right fifth rib fracture. Possible nondisplaced lateral left fifth rib fracture, partially obscured by overlying pacer. No pneumothorax is seen. Mild compression deformity within the upper lumbar spine seen on lateral view. IMPRESSION: 1. Minimally displaced lateral right fifth rib fracture. 2. Possible nondisplaced lateral left fifth rib fracture. 3. No pneumothorax. 4. Small left pleural effusion with associated streaky left basilar opacities, favor atelectasis. 5. Mild compression deformity within the upper lumbar spine seen on lateral view, age indeterminate. Electronically  Signed   By: Davina Poke D.O.   On: 12/18/2019 11:09   CT CHEST ABDOMEN PELVIS W CONTRAST  Result Date: 12/18/2019 CLINICAL DATA:  Fall, rib fracture, intra-abdominal trauma, pleurisy EXAM: CT CHEST, ABDOMEN, AND PELVIS WITH CONTRAST TECHNIQUE: Multidetector CT imaging of the chest, abdomen and pelvis was performed following the standard protocol during bolus administration of intravenous contrast. CONTRAST:  76mL OMNIPAQUE IOHEXOL 300 MG/ML  SOLN COMPARISON:  07/03/2005 FINDINGS: CT CHEST FINDINGS Cardiovascular: There is extensive multi-vessel coronary artery calcification. Moderate calcification of the aortic valvular leaflets, the left  ventricular outflow tract, the mitral valve leaflets and the mitral valve annulus. Mild left ventricular hypertrophy. Left subclavian pacemaker leads are seen within the right atrium and right ventricle. No pericardial effusion. The central pulmonary arteries are enlarged in keeping with changes of pulmonary arterial hypertension. There is extensive atherosclerotic calcification noted within the aortic arch and descending thoracic aorta. No aortic aneurysm. Mediastinum/Nodes: Thyroid unremarkable. No pathologic thoracic adenopathy. Esophagus unremarkable peer Lungs/Pleura: Mild bibasilar interstitial fibrosis. Small left pleural effusion with associated left basilar atelectasis. No pneumothorax. Central airways are widely patent. Musculoskeletal: There are acute fractures of the left 4-10 ribs laterally with additional fractures of ribs 8-10 posteriorly. This may result in a free-floating segment of the chest wall in keeping with a flail chest. There is associated soft tissue swelling involving the lateral thoracic wall surrounding the rib fractures. CT ABDOMEN PELVIS FINDINGS Hepatobiliary: No focal liver abnormality is seen. No gallstones, gallbladder wall thickening, or biliary dilatation. Pancreas: Unremarkable Spleen: Unremarkable Adrenals/Urinary Tract: Severe left renal atrophy, possibly related to a chronic left UPJ obstruction. Mild right renal cortical atrophy. Multiple simple cortical cyst noted within the right kidney. No hydronephrosis on the right. No intrarenal enhancing masses or calcifications identified. The adrenal glands are unremarkable. Surgical changes of cystectomy and ileal conduit formation identified with the ostomy noted within the right lower quadrant. Stomach/Bowel: Stomach and small bowel are unremarkable. Moderate descending and severe sigmoid colonic diverticulosis. The large bowel is otherwise unremarkable. Appendix unremarkable. No free intraperitoneal gas or fluid peer  Vascular/Lymphatic: Extensive atherosclerotic calcification within the abdominal aorta and iliofemoral vasculature. Probable hemodynamically significant stenoses of the mesenteric arterial vasculature and right common iliac artery origin, not well characterized on this non arteriographic study. No aortic aneurysm. No pathologic adenopathy within the abdomen and pelvis. Reproductive: Status post hysterectomy. No adnexal masses. Other: Mild gaseous distention of the rectum without surrounding inflammatory change. Musculoskeletal: Right femoral neck ORIF utilizing 3 axial screws noted. Remote fractures of the left superior and inferior pubic rami are noted. No acute fracture of the osseous structures of the abdomen and pelvis. Remote compression fracture of L1 and L2 noted. Advanced degenerative changes noted within the lumbar spine. IMPRESSION: Acute fractures of the left 4-10 ribs with fractures of the left 8-10 ribs in 2 locations possibly resulting in flail chest physiology. Associated small left pleural effusion and left basilar atelectasis. No pneumothorax. Extensive coronary artery calcification. Extensive calcification of the aortic and mitral valves may indicate underlying valvular dysfunction. This would be better assessed with echocardiography if indicated. Mild left ventricular hypertrophy noted. Peripheral vascular disease. Possible hemodynamically significant stenoses of the mesenteric arteries and right lower extremity inflow. If indicated, this would better assessed with CT arteriography. No acute intra-abdominal injury. Aortic Atherosclerosis (ICD10-I70.0). Electronically Signed   By: Fidela Salisbury MD   On: 12/18/2019 13:25   DG CHEST PORT 1 VIEW  Result Date: 12/19/2019 CLINICAL DATA:  Rib fractures EXAM:  PORTABLE CHEST 1 VIEW COMPARISON:  Yesterday FINDINGS: Atelectasis and small pleural effusion at the left base without interval change. No apical pneumothorax or convincing basal pneumothorax.  Known left rib fractures. Normal heart size and stable aortic contours. Dual-chamber pacer leads from the left. IMPRESSION: Stable atelectasis and pleural fluid on the left. Electronically Signed   By: Monte Fantasia M.D.   On: 12/19/2019 09:51    Anti-infectives: Anti-infectives (From admission, onward)   None       Assessment/Plan Fall Left 4-10 rib fxs with 8-10 with 2 location fx - mobilize, PT/OT, IS, pulm toilet.  OT saw patient and recommends SNF.  Lives at home with wife  FEN - HH/carb mod VTE - ok for chemical prophylaxis from our standpoint ID - none needed   LOS: 1 day    Henreitta Cea , [facility] 12/19/2019, 11:51 AM Please see Amion for pager number during day hours 7:00am-4:30pm or 7:00am -11:30am on weekends

## 2019-12-20 LAB — BASIC METABOLIC PANEL
Anion gap: 8 (ref 5–15)
BUN: 25 mg/dL — ABNORMAL HIGH (ref 8–23)
CO2: 23 mmol/L (ref 22–32)
Calcium: 8.7 mg/dL — ABNORMAL LOW (ref 8.9–10.3)
Chloride: 106 mmol/L (ref 98–111)
Creatinine, Ser: 1.44 mg/dL — ABNORMAL HIGH (ref 0.61–1.24)
GFR, Estimated: 44 mL/min — ABNORMAL LOW (ref >60)
Glucose, Bld: 100 mg/dL — ABNORMAL HIGH (ref 70–99)
Potassium: 4.5 mmol/L (ref 3.5–5.1)
Sodium: 137 mmol/L (ref 135–145)

## 2019-12-20 LAB — CBC
HCT: 31.4 % — ABNORMAL LOW (ref 39.0–52.0)
Hemoglobin: 9.7 g/dL — ABNORMAL LOW (ref 13.0–17.0)
MCH: 29.6 pg (ref 26.0–34.0)
MCHC: 30.9 g/dL (ref 30.0–36.0)
MCV: 95.7 fL (ref 80.0–100.0)
Platelets: 201 10*3/uL (ref 150–400)
RBC: 3.28 MIL/uL — ABNORMAL LOW (ref 4.22–5.81)
RDW: 13.8 % (ref 11.5–15.5)
WBC: 5.1 10*3/uL (ref 4.0–10.5)
nRBC: 0 % (ref 0.0–0.2)

## 2019-12-20 LAB — C-REACTIVE PROTEIN: CRP: 1.6 mg/dL — ABNORMAL HIGH (ref <1.0)

## 2019-12-20 LAB — BRAIN NATRIURETIC PEPTIDE: B Natriuretic Peptide: 167 pg/mL — ABNORMAL HIGH (ref 0.0–100.0)

## 2019-12-20 LAB — MAGNESIUM: Magnesium: 2.1 mg/dL (ref 1.7–2.4)

## 2019-12-20 LAB — GLUCOSE, CAPILLARY
Glucose-Capillary: 162 mg/dL — ABNORMAL HIGH (ref 70–99)
Glucose-Capillary: 70 mg/dL (ref 70–99)
Glucose-Capillary: 160 mg/dL — ABNORMAL HIGH (ref 70–99)

## 2019-12-20 MED ORDER — GLIPIZIDE ER 5 MG PO TB24: 5.0000 mg | ORAL_TABLET | Freq: Every day | ORAL | Status: DC

## 2019-12-20 NOTE — TOC Initial Note (Addendum)
Transition of Care [facility]) - Initial/Assessment Note    Patient Details  Name: Vincent Jackson MRN: [id number] Date of Birth: 1919/05/14  [facility]) CM/SW Contact:    Vincent Chars, LCSW Phone Number: 12/20/2019, 12:39 PM  Clinical Narrative:  CSW spoke with pt regarding recommendation for SNF.  Pt is agreeable to this, is current resident at [facility] in [place], has been to the [facility] before and would like to return.  Permission given to speak with SNF and with daughther Vincent Jackson.  Pt is vaccinated for covid.  [place] aide in place in ALF apartment.  CSW spoke with daughter Vincent Jackson by phone who is also in agreement with this plan.       [place], waiting for call back to see if they manage this policy.  CSW was told Vincent Jackson has to submit a form and may not have answer today. Referral sent out to [facility].  Unable to speak with anyone at the facility to ask about bed availability.             Expected Discharge Plan: Skilled Nursing Facility Barriers to Discharge: Continued Medical Work up, SNF Pending bed offer   Patient Goals and CMS Choice Patient states their goals for this hospitalization and ongoing recovery are:: "get better" CMS Medicare.gov Compare Post Acute Care list provided to::  (pt current resident at [facility] and woud like [facility] SNF)    Expected Discharge Plan and Services Expected Discharge Plan: [place] Acute Care Choice: [place] Living arrangements for the past 2 months: [place]                                      Prior Living Arrangements/Services Living arrangements for the past 2 months: [place] Lives with:: Spouse Patient language and need for interpreter reviewed:: Yes Do you feel safe going back to the place where you live?: Yes      Need for Family Participation in Patient Care: Yes (Comment) Care giver support system in  place?: Yes (comment)   Criminal Activity/Legal Involvement Pertinent to Current Situation/Hospitalization: No - Comment as needed  Activities of Daily Living Home Assistive Devices/Equipment: Environmental consultant (specify type), Cane (specify quad or straight) ADL Screening (condition at time of admission) Patient's cognitive ability adequate to safely complete daily activities?: Yes Is the patient deaf or have difficulty hearing?: No Does the patient have difficulty seeing, even when wearing glasses/contacts?: No Does the patient have difficulty concentrating, remembering, or making decisions?: No Patient able to express need for assistance with ADLs?: Yes Does the patient have difficulty dressing or bathing?: Yes Independently performs ADLs?: No Communication: Independent Dressing (OT): Needs assistance Is this a change from baseline?: Pre-admission baseline Grooming: Needs assistance Is this a change from baseline?: Pre-admission baseline Feeding: Independent Bathing: Needs assistance Is this a change from baseline?: Pre-admission baseline Toileting: Needs assistance Is this a change from baseline?: Pre-admission baseline In/Out Bed: Needs assistance Is this a change from baseline?: Pre-admission baseline Walks in Home: Needs assistance Is this a change from baseline?: Pre-admission baseline Does the patient have difficulty walking or climbing stairs?: Yes Weakness of Legs: None Weakness of Arms/Hands: None  Permission Sought/Granted Permission sought to share information with : Facility Sport and exercise psychologist, Family Supports Permission granted to share information with : Yes, Verbal Permission Granted  Share  Information with NAME: daughter Vincent Jackson  Permission granted to share info w AGENCY: SNF        Emotional Assessment Appearance:: Appears stated age Attitude/Demeanor/Rapport: Engaged Affect (typically observed): Appropriate, Pleasant Orientation: : Oriented to Self, Oriented to  Place, Oriented to  Time, Oriented to Situation Alcohol / Substance Use: Not Applicable Psych Involvement: No (comment)  Admission diagnosis:  Flail chest [S22.5XXA] Patient Active Problem List   Diagnosis Date Noted  . Flail chest 12/18/2019  . Fall 05/19/2016  . Obstruction of left tear duct 05/19/2016  . Hip pain 04/16/2016  . Viral sepsis ([place]) 03/12/2016  . Influenza 03/12/2016  . PVD (peripheral vascular disease) ([place]) 01/24/2016  . Decreased pedal pulses 12/16/2015  . History of bladder cancer 12/16/2015  . CAD (coronary artery disease) 09/09/2015  . Hyperlipidemia 09/09/2015  . Diabetes ([place]) 09/09/2015   PCP:  Vincent Aus, MD Pharmacy:   [place], [place] [address] [place] [address] Phone: [phone] Fax: [phone]  [place], [place] [address] Phone: [phone] Fax: [phone]     Social Determinants of Health (SDOH) Interventions    Readmission Risk Interventions No flowsheet data found.

## 2019-12-20 NOTE — Progress Notes (Signed)
Pt has glipizide listed on home med list. He refused yesterday morning saying that he doesn't need. CBG = 70 at dinner time tonight. No s/s hypoglycemia. Encouraged PO intake of supper tray.

## 2019-12-20 NOTE — NC FL2 (Signed)
[place] LEVEL OF CARE SCREENING TOOL     IDENTIFICATION  Patient Name: Vincent Jackson Birthdate: 23-Feb-1920 Sex: male Admission Date (Current Location): 12/18/2019  [facility] and [place] Number:  Herbalist and Address:  [facility] [place], [place] [address], [place], [place] [address]      Provider Number: [id number]  Attending Physician Name and Address:  Thurnell Lose, MD  Relative Name and Phone Number:  Allen,Jane Daughter [phone]    Current Level of Care: Hospital Recommended Level of Care: [place] Prior Approval Number:    Date Approved/Denied:   PASRR Number: [id number]  Discharge Plan: SNF    Current Diagnoses: Patient Active Problem List   Diagnosis Date Noted   Flail chest 12/18/2019   Fall 05/19/2016   Obstruction of left tear duct 05/19/2016   Hip pain 04/16/2016   Viral sepsis ([name]) 03/12/2016   Influenza 03/12/2016   PVD (peripheral vascular disease) ([place]) 01/24/2016   Decreased pedal pulses 12/16/2015   History of bladder cancer 12/16/2015   CAD (coronary artery disease) 09/09/2015   Hyperlipidemia 09/09/2015   Diabetes ([place]) 09/09/2015    Orientation RESPIRATION BLADDER Height & Weight     Self, Time, Situation, Place  O2 Continent Weight: 139 lb 12.4 oz (63.4 kg) Height:     BEHAVIORAL SYMPTOMS/MOOD NEUROLOGICAL BOWEL NUTRITION STATUS      Continent Diet (heart healthy/carb modified, see discharge summary)  AMBULATORY STATUS COMMUNICATION OF NEEDS Skin   Limited Assist Verbally Other (Comment) (ecchymosis)                       Personal Care Assistance Level of Assistance  Bathing, Feeding, Dressing Bathing Assistance: Limited assistance Feeding assistance: Limited assistance Dressing Assistance: Limited assistance     Functional Limitations Info  Sight, Hearing, Speech Sight Info: Adequate Hearing Info: Adequate Speech Info: Adequate    SPECIAL  CARE FACTORS FREQUENCY  PT (By licensed PT), OT (By licensed OT)     PT Frequency: 5x week OT Frequency: 5x week            Contractures Contractures Info: Not present    Additional Factors Info  Code Status, Allergies, Insulin Sliding Scale Code Status Info: DNR Allergies Info: NKA   Insulin Sliding Scale Info: novolog 0-9 units 3x daily with meals       Current Medications (12/20/2019):  This is the current hospital active medication list Current Facility-Administered Medications  Medication Dose Route Frequency Provider Last Rate Last Admin   acetaminophen (TYLENOL) tablet 650 mg  650 mg Oral Q6H PRN Marcelyn Bruins, MD       Or   acetaminophen (TYLENOL) suppository 650 mg  650 mg Rectal Q6H PRN Marcelyn Bruins, MD       aspirin EC tablet 81 mg  81 mg Oral Daily Oren Binet M, MD   81 mg at 12/20/19 1148   enoxaparin (LOVENOX) injection 30 mg  30 mg Subcutaneous Q24H Darlina Sicilian, RPH   30 mg at 12/19/19 2156   insulin aspart (novoLOG) injection 0-9 Units  0-9 Units Subcutaneous TID WC Jonetta Osgood, MD   2 Units at 12/20/19 1336   latanoprost (XALATAN) 0.005 % ophthalmic solution 1 drop  1 drop Both Eyes QHS Marcelyn Bruins, MD   1 drop at 12/19/19 2157   lidocaine (LIDODERM) 5 % 1 patch  1 patch Transdermal Q24H Eliot Ford, MD   1  patch at 12/20/19 0103   methocarbamol (ROBAXIN) 500 mg in dextrose 5 % 50 mL IVPB  500 mg Intravenous Q8H PRN Eliot Ford, MD       metoprolol succinate (TOPROL-XL) 24 hr tablet 25 mg  25 mg Oral Daily Marcelyn Bruins, MD   25 mg at 12/20/19 1150   multivitamin with minerals tablet 1 tablet  1 tablet Oral Daily Marcelyn Bruins, MD   1 tablet at 12/20/19 1149   pantoprazole (PROTONIX) EC tablet 40 mg  40 mg Oral Daily Marcelyn Bruins, MD   40 mg at 12/20/19 1148   potassium chloride (KLOR-CON) CR tablet 10 mEq  10 mEq Oral Daily Marcelyn Bruins, MD   10 mEq at 12/20/19 1150    simvastatin (ZOCOR) tablet 40 mg  40 mg Oral Daily Marcelyn Bruins, MD   40 mg at 12/20/19 1150   sodium chloride flush (NS) 0.9 % injection 3 mL  3 mL Intravenous Q12H Marcelyn Bruins, MD   3 mL at 12/19/19 2200   vitamin B-12 (CYANOCOBALAMIN) tablet 500 mcg  500 mcg Oral Daily Marcelyn Bruins, MD   500 mcg at 12/20/19 1149     Discharge Medications: Please see discharge summary for a list of discharge medications.  Relevant Imaging Results:  Relevant Lab Results:   Additional Information SSN [id number]  Joanne Chars, LCSW

## 2019-12-20 NOTE — Progress Notes (Signed)
RT instructed the patient on the use of flutter valve and incentive spirometer. Patient able to demonstrate back good technique with both devices.

## 2019-12-20 NOTE — Progress Notes (Signed)
Subjective: No new complaints.  Only pain when he lays on his left side.  Eating well.  Therapies rec SNF at DC  ROS: See above, otherwise other systems negative  Objective: Vital signs in last 24 hours: Temp:  [97.9 F (36.6 C)-98.7 F (37.1 C)] 98 F (36.7 C) (10/24 0544) Pulse Rate:  [78-99] 80 (10/24 0544) Resp:  [18] 18 (10/23 1909) BP: (111-122)/(65-73) 111/65 (10/24 0544) SpO2:  [97 %-100 %] 100 % (10/24 0544) Last BM Date: 12/17/19  Intake/Output from previous day: No intake/output data recorded. Intake/Output this shift: No intake/output data recorded.  PE: Gen: NAD Heart: regular Lungs: CTAB, tender over left chest wall with ecchymosis over left chest Abd: soft, NT, ND  Lab Results:  Recent Labs    12/19/19 0313 12/20/19 0837  WBC 5.2 5.1  HGB 9.9* 9.7*  HCT 31.5* 31.4*  PLT 211 201   BMET Recent Labs    12/18/19 1148 12/19/19 0313  NA 136 138  K 4.5 4.6  CL 103 104  CO2 25 26  GLUCOSE 125* 99  BUN 27* 28*  CREATININE 1.52* 1.63*  CALCIUM 8.8* 8.7*   PT/INR No results for input(s): LABPROT, INR in the last 72 hours. CMP     Component Value Date/Time   NA 138 12/19/2019 0313   NA 136 12/05/2013 0427   K 4.6 12/19/2019 0313   K 4.6 12/05/2013 0427   CL 104 12/19/2019 0313   CL 102 12/05/2013 0427   CO2 26 12/19/2019 0313   CO2 28 12/05/2013 0427   GLUCOSE 99 12/19/2019 0313   GLUCOSE 135 (H) 12/05/2013 0427   BUN 28 (H) 12/19/2019 0313   BUN 36 (H) 12/05/2013 0427   CREATININE 1.63 (H) 12/19/2019 0313   CREATININE 1.60 (H) 12/05/2013 0427   CALCIUM 8.7 (L) 12/19/2019 0313   CALCIUM 8.1 (L) 12/05/2013 0427   PROT 6.7 04/16/2016 1828   PROT 6.2 (L) 12/05/2013 0427   ALBUMIN 3.9 04/16/2016 1828   ALBUMIN 3.0 (L) 12/05/2013 0427   AST 30 04/16/2016 1828   AST 28 12/05/2013 0427   ALT 17 04/16/2016 1828   ALT 21 12/05/2013 0427   ALKPHOS 49 04/16/2016 1828   ALKPHOS 68 12/05/2013 0427   BILITOT 0.6 04/16/2016 1828    BILITOT 0.3 12/05/2013 0427   GFRNONAA 38 (L) 12/19/2019 0313   GFRNONAA 43 (L) 12/05/2013 0427   GFRNONAA 43 (L) 10/07/2011 2059   GFRAA 48 (L) 04/17/2016 0106   GFRAA 52 (L) 12/05/2013 0427   GFRAA 50 (L) 10/07/2011 2059   Lipase  No results found for: LIPASE     Studies/Results: DG Chest 2 View  Result Date: 12/18/2019 CLINICAL DATA:  Multiple falls EXAM: CHEST - 2 VIEW COMPARISON:  03/12/2016 FINDINGS: Stable positioning of left-sided implanted cardiac device. Heart size is upper limits of normal, unchanged. Densely calcified thoracic aorta. Small left pleural effusion with associated streaky left basilar opacities. Minimally displaced lateral right fifth rib fracture. Possible nondisplaced lateral left fifth rib fracture, partially obscured by overlying pacer. No pneumothorax is seen. Mild compression deformity within the upper lumbar spine seen on lateral view. IMPRESSION: 1. Minimally displaced lateral right fifth rib fracture. 2. Possible nondisplaced lateral left fifth rib fracture. 3. No pneumothorax. 4. Small left pleural effusion with associated streaky left basilar opacities, favor atelectasis. 5. Mild compression deformity within the upper lumbar spine seen on lateral view, age indeterminate. Electronically Signed   By: Davina Poke D.O.  On: 12/18/2019 11:09   CT CHEST ABDOMEN PELVIS W CONTRAST  Result Date: 12/18/2019 CLINICAL DATA:  Fall, rib fracture, intra-abdominal trauma, pleurisy EXAM: CT CHEST, ABDOMEN, AND PELVIS WITH CONTRAST TECHNIQUE: Multidetector CT imaging of the chest, abdomen and pelvis was performed following the standard protocol during bolus administration of intravenous contrast. CONTRAST:  68mL OMNIPAQUE IOHEXOL 300 MG/ML  SOLN COMPARISON:  07/03/2005 FINDINGS: CT CHEST FINDINGS Cardiovascular: There is extensive multi-vessel coronary artery calcification. Moderate calcification of the aortic valvular leaflets, the left ventricular outflow tract, the  mitral valve leaflets and the mitral valve annulus. Mild left ventricular hypertrophy. Left subclavian pacemaker leads are seen within the right atrium and right ventricle. No pericardial effusion. The central pulmonary arteries are enlarged in keeping with changes of pulmonary arterial hypertension. There is extensive atherosclerotic calcification noted within the aortic arch and descending thoracic aorta. No aortic aneurysm. Mediastinum/Nodes: Thyroid unremarkable. No pathologic thoracic adenopathy. Esophagus unremarkable peer Lungs/Pleura: Mild bibasilar interstitial fibrosis. Small left pleural effusion with associated left basilar atelectasis. No pneumothorax. Central airways are widely patent. Musculoskeletal: There are acute fractures of the left 4-10 ribs laterally with additional fractures of ribs 8-10 posteriorly. This may result in a free-floating segment of the chest wall in keeping with a flail chest. There is associated soft tissue swelling involving the lateral thoracic wall surrounding the rib fractures. CT ABDOMEN PELVIS FINDINGS Hepatobiliary: No focal liver abnormality is seen. No gallstones, gallbladder wall thickening, or biliary dilatation. Pancreas: Unremarkable Spleen: Unremarkable Adrenals/Urinary Tract: Severe left renal atrophy, possibly related to a chronic left UPJ obstruction. Mild right renal cortical atrophy. Multiple simple cortical cyst noted within the right kidney. No hydronephrosis on the right. No intrarenal enhancing masses or calcifications identified. The adrenal glands are unremarkable. Surgical changes of cystectomy and ileal conduit formation identified with the ostomy noted within the right lower quadrant. Stomach/Bowel: Stomach and small bowel are unremarkable. Moderate descending and severe sigmoid colonic diverticulosis. The large bowel is otherwise unremarkable. Appendix unremarkable. No free intraperitoneal gas or fluid peer Vascular/Lymphatic: Extensive  atherosclerotic calcification within the abdominal aorta and iliofemoral vasculature. Probable hemodynamically significant stenoses of the mesenteric arterial vasculature and right common iliac artery origin, not well characterized on this non arteriographic study. No aortic aneurysm. No pathologic adenopathy within the abdomen and pelvis. Reproductive: Status post hysterectomy. No adnexal masses. Other: Mild gaseous distention of the rectum without surrounding inflammatory change. Musculoskeletal: Right femoral neck ORIF utilizing 3 axial screws noted. Remote fractures of the left superior and inferior pubic rami are noted. No acute fracture of the osseous structures of the abdomen and pelvis. Remote compression fracture of L1 and L2 noted. Advanced degenerative changes noted within the lumbar spine. IMPRESSION: Acute fractures of the left 4-10 ribs with fractures of the left 8-10 ribs in 2 locations possibly resulting in flail chest physiology. Associated small left pleural effusion and left basilar atelectasis. No pneumothorax. Extensive coronary artery calcification. Extensive calcification of the aortic and mitral valves may indicate underlying valvular dysfunction. This would be better assessed with echocardiography if indicated. Mild left ventricular hypertrophy noted. Peripheral vascular disease. Possible hemodynamically significant stenoses of the mesenteric arteries and right lower extremity inflow. If indicated, this would better assessed with CT arteriography. No acute intra-abdominal injury. Aortic Atherosclerosis (ICD10-I70.0). Electronically Signed   By: Fidela Salisbury MD   On: 12/18/2019 13:25   DG CHEST PORT 1 VIEW  Result Date: 12/19/2019 CLINICAL DATA:  Rib fractures EXAM: PORTABLE CHEST 1 VIEW COMPARISON:  Yesterday FINDINGS: Atelectasis and  small pleural effusion at the left base without interval change. No apical pneumothorax or convincing basal pneumothorax. Known left rib fractures.  Normal heart size and stable aortic contours. Dual-chamber pacer leads from the left. IMPRESSION: Stable atelectasis and pleural fluid on the left. Electronically Signed   By: Monte Fantasia M.D.   On: 12/19/2019 09:51    Anti-infectives: Anti-infectives (From admission, onward)   None       Assessment/Plan Fall Left 4-10 rib fxs with 8-10 with 2 location fx - mobilize, PT/OT, IS, pulm toilet. Doing well.  Stable for DC from trauma standpoint.  Therapies recommend SNF.  Defer dispo to primary service.  He can follow up with his PCP prn.  FEN - HH/carb mod VTE - ok for chemical prophylaxis from our standpoint ID - none needed   LOS: 2 days    Vincent Jackson , [facility] Surgery 12/20/2019, 9:24 AM Please see Amion for pager number during day hours 7:00am-4:30pm or 7:00am -11:30am on weekends

## 2019-12-20 NOTE — Progress Notes (Signed)
PROGRESS NOTE        PATIENT DETAILS Name: Vincent Jackson Age: 86 y.o. Sex: male Date of Birth: 1919-08-24 Admit Date: 12/18/2019 Admitting Physician Marcelyn Bruins, MD JAS:[id number], Christean Grief, MD  Brief Narrative: Patient is a 86 y.o. male with history of CAD s/p CABG in 1998, sick sinus syndrome s/p PPM, HTN, HLD, chronic diastolic heart failure, COPD, CKD stage IV-presenting with fall and multiple rib fractures-transfer from [facility] to [facility] for concern of flail chest on trauma evaluation.  Significant events: 10/22>> admit-multiple rib fractures-concern for flail chest  Significant studies: 10/22>> CT chest/abdomen/pelvis: Acute rib fractures left 4-10 ribs, 8-10 ribs in 2 locations possibly resulting in flail chest.  Antimicrobial therapy: None  Microbiology data: None  Procedures : None  Consults: Trauma  DVT Prophylaxis : enoxaparin (LOVENOX) injection 30 mg Start: 12/19/19 2000 SCDs Start: 12/18/19 2226  Start prophylactic Lovenox   Subjective: Patient in bed, appears comfortable, denies any headache, no fever, no chest pain or pressure, no shortness of breath , no abdominal pain. No focal weakness. He does not want to wear monitor and would like to be discharged as soon as possible.   Assessment/Plan: Multiple rib fractures-concern for flail chest: Trauma service following-continue care per trauma services.   Encourage incentive spirometry, sitting up in chair, PT OT evaluation. SNF placement awaited.  Falls: Multiple falls in the past 1 month-3 episodes per patient-await PT/OT evaluation-lives at ALF-May need SNF.  History of CAD s/p CABG 1998-subsequent PCI's/sick sinus syndrome SP pacemaker placement: Continue telemetry monitoring-resume aspirin-continue statin and metoprolol.  CKD stage IV: Creatinine close to baseline  DM-2: Hold glipizide-start SSI and follow CBGs.  Recent Labs    12/19/19 1800  GLUCAP 213*     Deconditioning/debility:   PT/OT eval >> SNF.  Diet: Diet Order            Diet heart healthy/carb modified Room service appropriate? No; Fluid consistency: Thin  Diet effective now                  Code Status: DNR  Family Communication: Daughter-Jane [phone] over the phone 10/24  Disposition Plan: Status is: Inpatient  Remains inpatient appropriate because:Inpatient level of care appropriate due to severity of illness  Dispo: The patient is from: ALF              Anticipated d/c is to: SNF              Anticipated d/c date is: 3 days              Patient currently is not medically stable to d/c.   Barriers to Discharge:  Antimicrobial agents: Anti-infectives (From admission, onward)   None       Time spent: 25-minutes-Greater than 50% of this time was spent in counseling, explanation of diagnosis, planning of further management, and coordination of care.  MEDICATIONS: Scheduled Meds: . aspirin EC  81 mg Oral Daily  . enoxaparin (LOVENOX) injection  30 mg Subcutaneous Q24H  . insulin aspart  0-9 Units Subcutaneous TID WC  . latanoprost  1 drop Both Eyes QHS  . lidocaine  1 patch Transdermal Q24H  . metoprolol succinate  25 mg Oral Daily  . multivitamin with minerals  1 tablet Oral Daily  . pantoprazole  40 mg Oral Daily  . potassium chloride  10  mEq Oral Daily  . simvastatin  40 mg Oral Daily  . sodium chloride flush  3 mL Intravenous Q12H  . vitamin B-12  500 mcg Oral Daily   Continuous Infusions: . methocarbamol (ROBAXIN) IV     PRN Meds:.acetaminophen **OR** acetaminophen, methocarbamol (ROBAXIN) IV   PHYSICAL EXAM: Vital signs: Vitals:   12/19/19 0246 12/19/19 1436 12/19/19 1909 12/20/19 0544  BP: 130/67 122/73 112/68 111/65  Pulse:  99 78 80  Resp:  18 18   Temp: 98.7 F (37.1 C) 97.9 F (36.6 C) 98.7 F (37.1 C) 98 F (36.7 C)  TempSrc: Oral Oral Oral Oral  SpO2:  97% 98% 100%  Weight: 63.4 kg      Filed Weights    12/19/19 0246  Weight: 63.4 kg   Body mass index is 18.96 kg/m.   Gen Exam:  Awake Alert, No new F.N deficits, Normal affect [place].AT,PERRAL Supple Neck,No JVD, No cervical lymphadenopathy appriciated.  Symmetrical Chest wall movement, Good air movement bilaterally, CTAB RRR,No Gallops, Rubs or new Murmurs, No Parasternal Heave +ve B.Sounds, Abd Soft, No tenderness, No organomegaly appriciated, No rebound - guarding or rigidity. No Cyanosis, Clubbing or edema, No new Rash or bruise  I have personally reviewed following labs and imaging studies  LABORATORY DATA: CBC: Recent Labs  Lab 12/18/19 1148 12/19/19 0313 12/20/19 0837  WBC 5.4 5.2 5.1  NEUTROABS 3.6  --   --   HGB 11.0* 9.9* 9.7*  HCT 34.2* 31.5* 31.4*  MCV 95.3 96.6 95.7  PLT 204 211 094    Basic Metabolic Panel: Recent Labs  Lab 12/18/19 1148 12/19/19 0313 12/20/19 0837  NA 136 138 137  K 4.5 4.6 4.5  CL 103 104 106  CO2 25 26 23   GLUCOSE 125* 99 100*  BUN 27* 28* 25*  CREATININE 1.52* 1.63* 1.44*  CALCIUM 8.8* 8.7* 8.7*  MG  --   --  2.1    GFR: CrCl cannot be calculated (Unknown ideal weight.).  Liver Function Tests: No results for input(s): AST, ALT, ALKPHOS, BILITOT, PROT, ALBUMIN in the last 168 hours. No results for input(s): LIPASE, AMYLASE in the last 168 hours. No results for input(s): AMMONIA in the last 168 hours.  Coagulation Profile: No results for input(s): INR, PROTIME in the last 168 hours.  Cardiac Enzymes: No results for input(s): CKTOTAL, CKMB, CKMBINDEX, TROPONINI in the last 168 hours.  BNP (last 3 results) No results for input(s): PROBNP in the last 8760 hours.  Lipid Profile: No results for input(s): CHOL, HDL, LDLCALC, TRIG, CHOLHDL, LDLDIRECT in the last 72 hours.  Thyroid Function Tests: No results for input(s): TSH, T4TOTAL, FREET4, T3FREE, THYROIDAB in the last 72 hours.  Anemia Panel: No results for input(s): VITAMINB12, FOLATE, FERRITIN, TIBC, IRON,  RETICCTPCT in the last 72 hours.  Urine analysis:    Component Value Date/Time   COLORURINE YELLOW (A) 03/12/2016 1152   APPEARANCEUR HAZY (A) 03/12/2016 1152   APPEARANCEUR Cloudy 11/23/2013 0251   LABSPEC 1.011 03/12/2016 1152   LABSPEC 1.009 11/23/2013 0251   PHURINE 9.0 (H) 03/12/2016 1152   GLUCOSEU NEGATIVE 03/12/2016 1152   GLUCOSEU Negative 11/23/2013 0251   HGBUR NEGATIVE 03/12/2016 1152   BILIRUBINUR NEGATIVE 03/12/2016 1152   BILIRUBINUR Negative 11/23/2013 0251   KETONESUR NEGATIVE 03/12/2016 1152   PROTEINUR 30 (A) 03/12/2016 1152   NITRITE POSITIVE (A) 03/12/2016 1152   LEUKOCYTESUR NEGATIVE 03/12/2016 1152   LEUKOCYTESUR Trace 11/23/2013 0251    Sepsis Labs: Lactic Acid, Venous No  results found for: LATICACIDVEN  MICROBIOLOGY: Recent Results (from the past 240 hour(s))  Respiratory Panel by RT PCR (Flu A&B, Covid) - Nasopharyngeal Swab     Status: None   Collection Time: 12/18/19  2:03 PM   Specimen: Nasopharyngeal Swab  Result Value Ref Range Status   SARS Coronavirus 2 by RT PCR NEGATIVE NEGATIVE Final    Comment: (NOTE) SARS-CoV-2 target nucleic acids are NOT DETECTED.  The SARS-CoV-2 RNA is generally detectable in upper respiratoy specimens during the acute phase of infection. The lowest concentration of SARS-CoV-2 viral copies this assay can detect is 131 copies/mL. A negative result does not preclude SARS-Cov-2 infection and should not be used as the sole basis for treatment or other patient management decisions. A negative result may occur with  improper specimen collection/handling, submission of specimen other than nasopharyngeal swab, presence of viral mutation(s) within the areas targeted by this assay, and inadequate number of viral copies (<131 copies/mL). A negative result must be combined with clinical observations, patient history, and epidemiological information. The expected result is Negative.  Fact Sheet for Patients:   PinkCheek.be  Fact Sheet for Healthcare Providers:  GravelBags.it  This test is no t yet approved or cleared by the [place] FDA and  has been authorized for detection and/or diagnosis of SARS-CoV-2 by FDA under an Emergency Use Authorization (EUA). This EUA will remain  in effect (meaning this test can be used) for the duration of the COVID-19 declaration under Section 564(b)(1) of the Act, 21 U.S.C. section 360bbb-3(b)(1), unless the authorization is terminated or revoked sooner.     Influenza A by PCR NEGATIVE NEGATIVE Final   Influenza B by PCR NEGATIVE NEGATIVE Final    Comment: (NOTE) The Xpert Xpress SARS-CoV-2/FLU/RSV assay is intended as an aid in  the diagnosis of influenza from Nasopharyngeal swab specimens and  should not be used as a sole basis for treatment. Nasal washings and  aspirates are unacceptable for Xpert Xpress SARS-CoV-2/FLU/RSV  testing.  Fact Sheet for Patients: PinkCheek.be  Fact Sheet for Healthcare Providers: GravelBags.it  This test is not yet approved or cleared by the [place] FDA and  has been authorized for detection and/or diagnosis of SARS-CoV-2 by  FDA under an Emergency Use Authorization (EUA). This EUA will remain  in effect (meaning this test can be used) for the duration of the  Covid-19 declaration under Section 564(b)(1) of the Act, 21  U.S.C. section 360bbb-3(b)(1), unless the authorization is  terminated or revoked. Performed at [facility], [address] [place], [place] [address]     RADIOLOGY STUDIES/RESULTS: CT CHEST ABDOMEN PELVIS W CONTRAST  Result Date: 12/18/2019 CLINICAL DATA:  Fall, rib fracture, intra-abdominal trauma, pleurisy EXAM: CT CHEST, ABDOMEN, AND PELVIS WITH CONTRAST TECHNIQUE: Multidetector CT imaging of the chest, abdomen and pelvis was performed following the  standard protocol during bolus administration of intravenous contrast. CONTRAST:  42mL OMNIPAQUE IOHEXOL 300 MG/ML  SOLN COMPARISON:  07/03/2005 FINDINGS: CT CHEST FINDINGS Cardiovascular: There is extensive multi-vessel coronary artery calcification. Moderate calcification of the aortic valvular leaflets, the left ventricular outflow tract, the mitral valve leaflets and the mitral valve annulus. Mild left ventricular hypertrophy. Left subclavian pacemaker leads are seen within the right atrium and right ventricle. No pericardial effusion. The central pulmonary arteries are enlarged in keeping with changes of pulmonary arterial hypertension. There is extensive atherosclerotic calcification noted within the aortic arch and descending thoracic aorta. No aortic aneurysm. Mediastinum/Nodes: Thyroid unremarkable. No pathologic thoracic adenopathy. Esophagus  unremarkable peer Lungs/Pleura: Mild bibasilar interstitial fibrosis. Small left pleural effusion with associated left basilar atelectasis. No pneumothorax. Central airways are widely patent. Musculoskeletal: There are acute fractures of the left 4-10 ribs laterally with additional fractures of ribs 8-10 posteriorly. This may result in a free-floating segment of the chest wall in keeping with a flail chest. There is associated soft tissue swelling involving the lateral thoracic wall surrounding the rib fractures. CT ABDOMEN PELVIS FINDINGS Hepatobiliary: No focal liver abnormality is seen. No gallstones, gallbladder wall thickening, or biliary dilatation. Pancreas: Unremarkable Spleen: Unremarkable Adrenals/Urinary Tract: Severe left renal atrophy, possibly related to a chronic left UPJ obstruction. Mild right renal cortical atrophy. Multiple simple cortical cyst noted within the right kidney. No hydronephrosis on the right. No intrarenal enhancing masses or calcifications identified. The adrenal glands are unremarkable. Surgical changes of cystectomy and ileal  conduit formation identified with the ostomy noted within the right lower quadrant. Stomach/Bowel: Stomach and small bowel are unremarkable. Moderate descending and severe sigmoid colonic diverticulosis. The large bowel is otherwise unremarkable. Appendix unremarkable. No free intraperitoneal gas or fluid peer Vascular/Lymphatic: Extensive atherosclerotic calcification within the abdominal aorta and iliofemoral vasculature. Probable hemodynamically significant stenoses of the mesenteric arterial vasculature and right common iliac artery origin, not well characterized on this non arteriographic study. No aortic aneurysm. No pathologic adenopathy within the abdomen and pelvis. Reproductive: Status post hysterectomy. No adnexal masses. Other: Mild gaseous distention of the rectum without surrounding inflammatory change. Musculoskeletal: Right femoral neck ORIF utilizing 3 axial screws noted. Remote fractures of the left superior and inferior pubic rami are noted. No acute fracture of the osseous structures of the abdomen and pelvis. Remote compression fracture of L1 and L2 noted. Advanced degenerative changes noted within the lumbar spine. IMPRESSION: Acute fractures of the left 4-10 ribs with fractures of the left 8-10 ribs in 2 locations possibly resulting in flail chest physiology. Associated small left pleural effusion and left basilar atelectasis. No pneumothorax. Extensive coronary artery calcification. Extensive calcification of the aortic and mitral valves may indicate underlying valvular dysfunction. This would be better assessed with echocardiography if indicated. Mild left ventricular hypertrophy noted. Peripheral vascular disease. Possible hemodynamically significant stenoses of the mesenteric arteries and right lower extremity inflow. If indicated, this would better assessed with CT arteriography. No acute intra-abdominal injury. Aortic Atherosclerosis (ICD10-I70.0). Electronically Signed   By: Fidela Salisbury MD   On: 12/18/2019 13:25   DG CHEST PORT 1 VIEW  Result Date: 12/19/2019 CLINICAL DATA:  Rib fractures EXAM: PORTABLE CHEST 1 VIEW COMPARISON:  Yesterday FINDINGS: Atelectasis and small pleural effusion at the left base without interval change. No apical pneumothorax or convincing basal pneumothorax. Known left rib fractures. Normal heart size and stable aortic contours. Dual-chamber pacer leads from the left. IMPRESSION: Stable atelectasis and pleural fluid on the left. Electronically Signed   By: Monte Fantasia M.D.   On: 12/19/2019 09:51     LOS: 2 days   Signature  Lala Lund M.D on 12/20/2019 at 11:37 AM   -  To page go to [email or web address]

## 2019-12-21 ENCOUNTER — Encounter
Admission: RE | Admit: 2019-12-21 | Discharge: 2019-12-21 | Disposition: A | Discharge status: Home / Self Care | Payer: Medicare HMO | Source: Ambulatory Visit | Attending: Internal Medicine | Admitting: Internal Medicine

## 2019-12-21 ENCOUNTER — Encounter (HOSPITAL_COMMUNITY): Payer: Self-pay | Admitting: Internal Medicine

## 2019-12-21 DIAGNOSIS — S225XXA Flail chest, initial encounter for closed fracture: Principal | ICD-10-CM

## 2019-12-21 LAB — GLUCOSE, CAPILLARY
Glucose-Capillary: 120 mg/dL — ABNORMAL HIGH (ref 70–99)
Glucose-Capillary: 178 mg/dL — ABNORMAL HIGH (ref 70–99)
Glucose-Capillary: 124 mg/dL — ABNORMAL HIGH (ref 70–99)
Glucose-Capillary: 164 mg/dL — ABNORMAL HIGH (ref 70–99)

## 2019-12-21 MED ORDER — ACETAMINOPHEN 500 MG PO TABS: 500.0000 mg | ORAL_TABLET | Freq: Three times a day (TID) | ORAL | 0 refills | Status: AC | PRN

## 2019-12-21 MED ORDER — INSULIN ASPART 100 UNIT/ML ~~LOC~~ SOLN: SUBCUTANEOUS | 0 refills | Status: AC

## 2019-12-21 NOTE — Discharge Instructions (Signed)
Follow with Primary MD Rusty Aus, MD in 7 days   Get CBC, CMP, 2 view Chest X ray -  checked next visit within 1 week by Primary MD or SNF MD   Activity: As tolerated with Full fall precautions use walker/cane & assistance as needed  Disposition SNF  Diet: Heart Healthy - Low Carb , with feeding assistance and aspiration precautions.  Accuchecks 4 times/day, Once in AM empty stomach and then before each meal. Log in all results and show them to your Prim.MD in 3 days. If any glucose reading is under 80 or above 300 call your Prim MD immidiately. Follow Low glucose instructions for glucose under 80 as instructed.  Special Instructions: If you have smoked or chewed Tobacco  in the last 2 yrs please stop smoking, stop any regular Alcohol  and or any Recreational drug use.  On your next visit with your primary care physician please Get Medicines reviewed and adjusted.  Please request your Prim.MD to go over all Hospital Tests and Procedure/Radiological results at the follow up, please get all Hospital records sent to your Prim MD by signing hospital release before you go home.  If you experience worsening of your admission symptoms, develop shortness of breath, life threatening emergency, suicidal or homicidal thoughts you must seek medical attention immediately by calling 911 or calling your MD immediately  if symptoms less severe.  You Must read complete instructions/literature along with all the possible adverse reactions/side effects for all the Medicines you take and that have been prescribed to you. Take any new Medicines after you have completely understood and accpet all the possible adverse reactions/side effects.         Rib Fracture  A rib fracture is a break or crack in one of the bones of the ribs. The ribs are like a cage that goes around your upper chest. A broken or cracked rib is often painful, but most do not cause other problems. Most rib fractures usually heal on  their own in 1-3 months. Follow these instructions at home: Managing pain, stiffness, and swelling  If directed, apply ice to the injured area. ? Put ice in a plastic bag. ? Place a towel between your skin and the bag. ? Leave the ice on for 20 minutes, 2-3 times a day.  Take over-the-counter and prescription medicines only as told by your doctor. Activity  Avoid activities that cause pain to the injured area. Protect your injured area.  Slowly increase activity as told by your doctor. General instructions  Do deep breathing as told by your doctor. You may be told to: ? Take deep breaths many times a day. ? Cough many times a day while hugging a pillow. ? Use a device (incentive spirometer) to do deep breathing many times a day.  Drink enough fluid to keep your pee (urine) clear or pale yellow.  Do not wear a rib belt or binder. These do not allow you to breathe deeply.  Keep all follow-up visits as told by your doctor. This is important. Contact a doctor if:  You have a fever. Get help right away if:  You have trouble breathing.  You are short of breath.  You cannot stop coughing.  You cough up thick or bloody spit (sputum).  You feel sick to your stomach (nauseous), throw up (vomit), or have belly (abdominal) pain.  Your pain gets worse and medicine does not help. Summary  A rib fracture is a break or crack  in one of the bones of the ribs.  Apply ice to the injured area and take medicines for pain as told by your doctor.  Take deep breaths and cough many times a day. Hug a pillow every time you cough. This information is not intended to replace advice given to you by your health care provider. Make sure you discuss any questions you have with your health care provider. Document Revised: 01/25/2017 Document Reviewed: 05/15/2016 Elsevier Patient Education  2020 [employer].

## 2019-12-21 NOTE — TOC Progression Note (Addendum)
Transition of Care [facility]) - Progression Note    Patient Details  Name: Vincent Jackson MRN: [id number] Date of Birth: 1920-01-29  Transition of Care [facility]) CM/SW [place], [place] Phone Number: 12/21/2019, 11:20 AM  Clinical Narrative:     Update 10/25 12:01pm-Kim with [facility] called to let CSW know the insurance authorization reference number for patient #[id number]. Kim let CSW know she is waiting on call back for fax number to send in updated clinicals. Maudie Mercury confirmed she will keep CSW updated.  CSW called patients insurance to start insurance authorization. Patient is not managed by [employer]. This patients has regular [employer]. CSW called Maudie Mercury with Social Work at [employer]. Maudie Mercury confirmed she will start insurance authorization. Kim confirmed she cannot accept patient today for discharge. Hopeful for discharge tomorrow if she receives insurance authorization. CSW updated patients daughter [name] with discharge plans.   Patient has SN[facility] bed at [facility]. Insurance authorization pending.  CSW will continue to follow.   Expected Discharge Plan: [place] Barriers to Discharge: Continued Medical Work up, SNF Pending bed offer  Expected Discharge Plan and Services Expected Discharge Plan: [place] Choice: [place] arrangements for the past 2 months: [place] Expected Discharge Date: 12/21/19                                     Social Determinants of Health (SDOH) Interventions    Readmission Risk Interventions No flowsheet data found.

## 2019-12-21 NOTE — Progress Notes (Signed)
Physical Therapy Treatment Patient Details Name: Vincent Jackson MRN: [id number] DOB: 09-22-1919 Today's Date: 12/21/2019    History of Present Illness 86 y.o. male with medical history significant of CAD status post CABG x3 in 1998 and subsequent stenting, sick sinus syndrome status post pacemaker, hypertension, hyperlipidemia, diabetes, CHF, COPD, CKD, aortic stenosis who presented following a fall. CT chest abdomen pelvis showed fractures of the left fourth rib 10 ribs and fractures of left 8 through 10 ribs in 2 locations     PT Comments    Pt making slow, steady progress. Continue to recommend ST-SNF at DC. Prior to admission pt and wife were in independent living apartment at facility.    Follow Up Recommendations  SNF     Equipment Recommendations  None recommended by PT    Recommendations for Other Services       Precautions / Restrictions Precautions Precautions: Fall    Mobility  Bed Mobility Overal bed mobility: Needs Assistance Bed Mobility: Supine to Sit;Sit to Supine     Supine to sit: Min guard;HOB elevated Sit to supine: Min guard;HOB elevated   General bed mobility comments: Incr time and effort with HOB elevated  Transfers Overall transfer level: Needs assistance Equipment used: 4-wheeled walker Transfers: Sit to/from Stand Sit to Stand: Min guard         General transfer comment: Assist for safety  Ambulation/Gait Ambulation/Gait assistance: Min assist Gait Distance (Feet): 80 Feet Assistive device: 4-wheeled walker Gait Pattern/deviations: Step-through pattern;Decreased stride length;Trunk flexed Gait velocity: decr Gait velocity interpretation: <1.31 ft/sec, indicative of household ambulator General Gait Details: Assist for balance. Pt with flexed knees, hips, and trunk throughout gait. Pt scuffing feet in slippers due to decr foot clearance   Stairs             Wheelchair Mobility    Modified Rankin (Stroke Patients Only)        Balance Overall balance assessment: Needs assistance Sitting-balance support: No upper extremity supported;Feet supported Sitting balance-Leahy Scale: Good     Standing balance support: No upper extremity supported Standing balance-Leahy Scale: Fair                              Cognition Arousal/Alertness: Awake/alert Behavior During Therapy: WFL for tasks assessed/performed Overall Cognitive Status: Within Functional Limits for tasks assessed                                        Exercises      General Comments        Pertinent Vitals/Pain Pain Assessment: Faces Faces Pain Scale: Hurts even more Pain Location: lt ribs Pain Descriptors / Indicators: Aching;Discomfort;Grimacing;Guarding Pain Intervention(s): Limited activity within patient's tolerance;Monitored during session;Repositioned    Home Living                      Prior Function            PT Goals (current goals can now be found in the care plan section) Acute Rehab PT Goals Patient Stated Goal: to get back to [facility] Progress towards PT goals: Progressing toward goals    Frequency    Min 2X/week      PT Plan Current plan remains appropriate    Co-evaluation              AM-PAC  PT "6 Clicks" Mobility   Outcome Measure  Help needed turning from your back to your side while in a flat bed without using bedrails?: None Help needed moving from lying on your back to sitting on the side of a flat bed without using bedrails?: None Help needed moving to and from a bed to a chair (including a wheelchair)?: A Little Help needed standing up from a chair using your arms (e.g., wheelchair or bedside chair)?: A Little Help needed to walk in hospital room?: A Little Help needed climbing 3-5 steps with a railing? : A Little 6 Click Score: 20    End of Session   Activity Tolerance: Patient tolerated treatment well Patient left: with call bell/phone  within reach;in bed;with bed alarm set Nurse Communication: Mobility status PT Visit Diagnosis: Unsteadiness on feet (R26.81);Repeated falls (R29.6);History of falling (Z91.81)     Time: 1256-1310 PT Time Calculation (min) (ACUTE ONLY): 14 min  Charges:  $Gait Training: 8-22 mins                     [place] Pager [phone] Office [place] 12/21/2019, 2:00 PM

## 2019-12-21 NOTE — Progress Notes (Signed)
Subjective: Patient laying in bed today.  Did not get OOB yesterday.  Only pulling about 750 on IS now as opposed to 1000-1250 on Saturday.  Eating well.  No complaints of pain except when moving or if he lays on his left chest  ROS: See above, otherwise other systems negative  Objective: Vital signs in last 24 hours: Temp:  [98.3 F (36.8 C)-99 F (37.2 C)] 98.5 F (36.9 C) (10/25 0600) Pulse Rate:  [71-77] 77 (10/25 0600) Resp:  [15-20] 20 (10/25 0600) BP: (108-118)/(53-64) 118/56 (10/25 0600) SpO2:  [91 %-98 %] 97 % (10/25 0600) Weight:  [62.7 kg] 62.7 kg (10/25 0600) Last BM Date: 12/20/19  Intake/Output from previous day: 10/24 0701 - 10/25 0700 In: 360 [P.O.:360] Out: 1850 [Urine:1850] Intake/Output this shift: No intake/output data recorded.  PE: Gen: NAD Heart: regular Lungs: CTAB, tender over left chest wall with ecchymosis over left chest, pulls 500-750 on IS Abd: soft, NT, ND  Lab Results:  Recent Labs    12/19/19 0313 12/20/19 0837  WBC 5.2 5.1  HGB 9.9* 9.7*  HCT 31.5* 31.4*  PLT 211 201   BMET Recent Labs    12/19/19 0313 12/20/19 0837  NA 138 137  K 4.6 4.5  CL 104 106  CO2 26 23  GLUCOSE 99 100*  BUN 28* 25*  CREATININE 1.63* 1.44*  CALCIUM 8.7* 8.7*   PT/INR No results for input(s): LABPROT, INR in the last 72 hours. CMP     Component Value Date/Time   NA 137 12/20/2019 0837   NA 136 12/05/2013 0427   K 4.5 12/20/2019 0837   K 4.6 12/05/2013 0427   CL 106 12/20/2019 0837   CL 102 12/05/2013 0427   CO2 23 12/20/2019 0837   CO2 28 12/05/2013 0427   GLUCOSE 100 (H) 12/20/2019 0837   GLUCOSE 135 (H) 12/05/2013 0427   BUN 25 (H) 12/20/2019 0837   BUN 36 (H) 12/05/2013 0427   CREATININE 1.44 (H) 12/20/2019 0837   CREATININE 1.60 (H) 12/05/2013 0427   CALCIUM 8.7 (L) 12/20/2019 0837   CALCIUM 8.1 (L) 12/05/2013 0427   PROT 6.7 04/16/2016 1828   PROT 6.2 (L) 12/05/2013 0427   ALBUMIN 3.9 04/16/2016 1828   ALBUMIN 3.0  (L) 12/05/2013 0427   AST 30 04/16/2016 1828   AST 28 12/05/2013 0427   ALT 17 04/16/2016 1828   ALT 21 12/05/2013 0427   ALKPHOS 49 04/16/2016 1828   ALKPHOS 68 12/05/2013 0427   BILITOT 0.6 04/16/2016 1828   BILITOT 0.3 12/05/2013 0427   GFRNONAA 44 (L) 12/20/2019 0837   GFRNONAA 43 (L) 12/05/2013 0427   GFRNONAA 43 (L) 10/07/2011 2059   GFRAA 48 (L) 04/17/2016 0106   GFRAA 52 (L) 12/05/2013 0427   GFRAA 50 (L) 10/07/2011 2059   Lipase  No results found for: LIPASE     Studies/Results: No results found.  Anti-infectives: Anti-infectives (From admission, onward)   None       Assessment/Plan Fall Left 4-10 rib fxs with 8-10 with 2 location fx- mobilize, PT/OT, IS, pulm toilet. Stable for DC from trauma standpoint.  Therapies recommend SNF.  Defer dispo to primary service.  He can follow up with his PCP prn. -we will sign off at this time as patient is stable and just awaiting placement.    FEN -HH/carb mod VTE -ok for chemical prophylaxis from our standpoint ID -none needed   LOS: 3 days    Claiborne Billings  Darol Destine , [facility] Surgery 12/21/2019, 8:15 AM Please see Amion for pager number during day hours 7:00am-4:30pm or 7:00am -11:30am on weekends

## 2019-12-21 NOTE — TOC CAGE-AID Note (Signed)
Transition of Care [facility]) - CAGE-AID Screening   Patient Details  Name: Vincent Jackson MRN: [id number] Date of Birth: [birth year]  Clinical Narrative: Patient denied any concerns regarding drinking habits. Endorses social alcohol use, no concerns at this time.    CAGE-AID Screening:    Have You Ever Felt You Ought to Cut Down on Your Drinking or Drug Use?: No Have People Annoyed You By Critizing Your Drinking Or Drug Use?: No Have You Felt Bad Or Guilty About Your Drinking Or Drug Use?: No Have You Ever Had a Drink or Used Drugs First Thing In The Morning to Steady Your Nerves or to Get Rid of a Hangover?: No CAGE-AID Score: 0

## 2019-12-21 NOTE — Discharge Summary (Addendum)
Wilborn Membreno Donaway VWP:[id number] DOB: [birth year] DOA: 12/18/2019  PCP: Rusty Aus, MD  Admit date: 12/18/2019  Discharge date: 12/22/2019  Admitted From: Home   Disposition:  SNF   Recommendations for Outpatient Follow-up:   Follow up with PCP in 1-2 weeks  PCP Please obtain BMP/CBC, 2 view CXR in 1week,  (see Discharge instructions)   PCP Please follow up on the following pending results:    Home Health: None Equipment/Devices: None  Consultations: None  Discharge Condition: Stable    CODE STATUS: Full    Diet Recommendation: Heart Healthy Low Carb  Diet Order            Diet heart healthy/carb modified Room service appropriate? No; Fluid consistency: Thin  Diet effective now                  CC - Fall   Brief history of present illness from the day of admission and additional interim summary    Patient is a 86 y.o. male with history of CAD s/p CABG in 1998, sick sinus syndrome s/p PPM, HTN, HLD, chronic diastolic heart failure, COPD, CKD stage IV-presenting with fall and multiple rib fractures-transfer from [facility] to [facility] for concern of flail chest on trauma evaluation.  Significant events: 10/22>> admit-multiple rib fractures-concern for flail chest  Significant studies: 10/22>> CT chest/abdomen/pelvis: Acute rib fractures left 4-10 ribs, 8-10 ribs in 2 locations possibly resulting in flail chest.                                                                  Hospital Course    Multiple rib fractures-concern for flail chest: Trauma service following - continue supportive care per trauma services.  Encourage incentive spirometry every 30 mins while awake, sit up in chair, tylenol PRN, he is surprisingly symptom-free and feels good, was seen by PT OT and will be discharged to SNF  today.  Was originally discharged on 12/21/2019, still waiting for insurance authorization, no change in plan, patient symptom-free having breakfast this morning.  Discharge to SNF once bed available.   Falls: Multiple falls in the past 1 month - 3 episodes per patient-await PT/OT evaluation-lived at ALF- will need SNF.  History of CAD s/p CABG 1998-subsequent PCI's/sick sinus syndrome SP pacemaker placement: Continue telemetry monitoring-resume aspirin-continue statin and metoprolol, patient at times does not take statin will defer it to patient and PCP.  CKD stage IV: Creatinine close to baseline  Deconditioning/debility:   PT/OT eval >> SNF.  DM-2: Was on glipizide which he says he has quit taking, discharging him on sliding scale before every meal at bedtime.  CBG (last 3)  Recent Labs    12/21/19 1616 12/21/19 2149 12/22/19 0800  GLUCAP 124* 164* 109*      Discharge diagnosis  Principal Problem:   Flail chest Active Problems:   CAD (coronary artery disease)   Hyperlipidemia   Diabetes (HCC)   PVD (peripheral vascular disease) [facility])   Fall    Discharge instructions    Discharge Instructions    Discharge instructions   Complete by: As directed    Follow with Primary MD Rusty Aus, MD in 7 days   Get CBC, CMP, 2 view Chest X ray -  checked next visit within 1 week by Primary MD or SNF MD   Activity: As tolerated with Full fall precautions use walker/cane & assistance as needed  Disposition SNF  Diet: Heart Healthy - Low Carb , with feeding assistance and aspiration precautions.  Accuchecks 4 times/day, Once in AM empty stomach and then before each meal. Log in all results and show them to your Prim.MD in 3 days. If any glucose reading is under 80 or above 300 call your Prim MD immidiately. Follow Low glucose instructions for glucose under 80 as instructed.  Special Instructions: If you have smoked or chewed Tobacco  in the last 2 yrs please stop  smoking, stop any regular Alcohol  and or any Recreational drug use.  On your next visit with your primary care physician please Get Medicines reviewed and adjusted.  Please request your Prim.MD to go over all Hospital Tests and Procedure/Radiological results at the follow up, please get all Hospital records sent to your Prim MD by signing hospital release before you go home.  If you experience worsening of your admission symptoms, develop shortness of breath, life threatening emergency, suicidal or homicidal thoughts you must seek medical attention immediately by calling 911 or calling your MD immediately  if symptoms less severe.  You Must read complete instructions/literature along with all the possible adverse reactions/side effects for all the Medicines you take and that have been prescribed to you. Take any new Medicines after you have completely understood and accpet all the possible adverse reactions/side effects.   Increase activity slowly   Complete by: As directed       Discharge Medications   Allergies as of 12/22/2019   No Known Allergies     Medication List    STOP taking these medications   glipiZIDE 10 MG 24 hr tablet Commonly known as: GLUCOTROL XL     TAKE these medications   acetaminophen 500 MG tablet Commonly known as: TYLENOL Take 1 tablet (500 mg total) by mouth every 8 (eight) hours as needed for moderate pain.   aspirin EC 81 MG tablet Take 81 mg by mouth daily.   Cyanocobalamin 500 MCG Subl Place 500 mcg under the tongue daily.   insulin aspart 100 UNIT/ML injection Commonly known as: NovoLOG Substitute to any brand approved.Before each meal 3 times a day, 140-199 - 2 units, 200-250 - 4 units, 251-299 - 6 units,  300-349 - 8 units,  350 or above 10 units. Dispense syringes and needles as needed, Ok to switch to PEN if approved. DX DM2, Code E11.65   latanoprost 0.005 % ophthalmic solution Commonly known as: XALATAN Place 1 drop into both eyes at  bedtime.   metoprolol succinate 25 MG 24 hr tablet Commonly known as: TOPROL-XL Take 25 mg by mouth daily.   multivitamin with minerals Tabs tablet Take 1 tablet by mouth daily.   pantoprazole 40 MG tablet Commonly known as: PROTONIX TAKE ONE TABLET EVERY MORNING FOR GERD What changed: See the new instructions.   potassium chloride 10 MEQ tablet  Commonly known as: KLOR-CON Take 10 mEq by mouth daily.   simvastatin 40 MG tablet Commonly known as: ZOCOR Take 40 mg by mouth daily.        Contact information for follow-up providers    Rusty Aus, MD. Schedule an appointment as soon as possible for a visit today.   Specialty: Internal Medicine Why: as needed for rib fractures Contact information: [place] [address] [phone]            Contact information for after-discharge care    Destination    [facility] Preferred SNF .   Service: Skilled Nursing Contact information: [address] [place] [phone]                  Major procedures and Radiology Reports - PLEASE review detailed and final reports thoroughly  -       DG Chest 2 View  Result Date: 12/18/2019 CLINICAL DATA:  Multiple falls EXAM: CHEST - 2 VIEW COMPARISON:  03/12/2016 FINDINGS: Stable positioning of left-sided implanted cardiac device. Heart size is upper limits of normal, unchanged. Densely calcified thoracic aorta. Small left pleural effusion with associated streaky left basilar opacities. Minimally displaced lateral right fifth rib fracture. Possible nondisplaced lateral left fifth rib fracture, partially obscured by overlying pacer. No pneumothorax is seen. Mild compression deformity within the upper lumbar spine seen on lateral view. IMPRESSION: 1. Minimally displaced lateral right fifth rib fracture. 2. Possible nondisplaced lateral left fifth rib fracture. 3. No pneumothorax. 4.  Small left pleural effusion with associated streaky left basilar opacities, favor atelectasis. 5. Mild compression deformity within the upper lumbar spine seen on lateral view, age indeterminate. Electronically Signed   By: Davina Poke D.O.   On: 12/18/2019 11:09   CT CHEST ABDOMEN PELVIS W CONTRAST  Result Date: 12/18/2019 CLINICAL DATA:  Fall, rib fracture, intra-abdominal trauma, pleurisy EXAM: CT CHEST, ABDOMEN, AND PELVIS WITH CONTRAST TECHNIQUE: Multidetector CT imaging of the chest, abdomen and pelvis was performed following the standard protocol during bolus administration of intravenous contrast. CONTRAST:  40mL OMNIPAQUE IOHEXOL 300 MG/ML  SOLN COMPARISON:  07/03/2005 FINDINGS: CT CHEST FINDINGS Cardiovascular: There is extensive multi-vessel coronary artery calcification. Moderate calcification of the aortic valvular leaflets, the left ventricular outflow tract, the mitral valve leaflets and the mitral valve annulus. Mild left ventricular hypertrophy. Left subclavian pacemaker leads are seen within the right atrium and right ventricle. No pericardial effusion. The central pulmonary arteries are enlarged in keeping with changes of pulmonary arterial hypertension. There is extensive atherosclerotic calcification noted within the aortic arch and descending thoracic aorta. No aortic aneurysm. Mediastinum/Nodes: Thyroid unremarkable. No pathologic thoracic adenopathy. Esophagus unremarkable peer Lungs/Pleura: Mild bibasilar interstitial fibrosis. Small left pleural effusion with associated left basilar atelectasis. No pneumothorax. Central airways are widely patent. Musculoskeletal: There are acute fractures of the left 4-10 ribs laterally with additional fractures of ribs 8-10 posteriorly. This may result in a free-floating segment of the chest wall in keeping with a flail chest. There is associated soft tissue swelling involving the lateral thoracic wall surrounding the rib fractures. CT ABDOMEN  PELVIS FINDINGS Hepatobiliary: No focal liver abnormality is seen. No gallstones, gallbladder wall thickening, or biliary dilatation. Pancreas: Unremarkable Spleen: Unremarkable Adrenals/Urinary Tract: Severe left renal atrophy, possibly related to a chronic left UPJ obstruction. Mild right renal cortical atrophy. Multiple simple cortical cyst noted within the right kidney. No hydronephrosis on the right. No intrarenal enhancing masses  or calcifications identified. The adrenal glands are unremarkable. Surgical changes of cystectomy and ileal conduit formation identified with the ostomy noted within the right lower quadrant. Stomach/Bowel: Stomach and small bowel are unremarkable. Moderate descending and severe sigmoid colonic diverticulosis. The large bowel is otherwise unremarkable. Appendix unremarkable. No free intraperitoneal gas or fluid peer Vascular/Lymphatic: Extensive atherosclerotic calcification within the abdominal aorta and iliofemoral vasculature. Probable hemodynamically significant stenoses of the mesenteric arterial vasculature and right common iliac artery origin, not well characterized on this non arteriographic study. No aortic aneurysm. No pathologic adenopathy within the abdomen and pelvis. Reproductive: Status post hysterectomy. No adnexal masses. Other: Mild gaseous distention of the rectum without surrounding inflammatory change. Musculoskeletal: Right femoral neck ORIF utilizing 3 axial screws noted. Remote fractures of the left superior and inferior pubic rami are noted. No acute fracture of the osseous structures of the abdomen and pelvis. Remote compression fracture of L1 and L2 noted. Advanced degenerative changes noted within the lumbar spine. IMPRESSION: Acute fractures of the left 4-10 ribs with fractures of the left 8-10 ribs in 2 locations possibly resulting in flail chest physiology. Associated small left pleural effusion and left basilar atelectasis. No pneumothorax. Extensive  coronary artery calcification. Extensive calcification of the aortic and mitral valves may indicate underlying valvular dysfunction. This would be better assessed with echocardiography if indicated. Mild left ventricular hypertrophy noted. Peripheral vascular disease. Possible hemodynamically significant stenoses of the mesenteric arteries and right lower extremity inflow. If indicated, this would better assessed with CT arteriography. No acute intra-abdominal injury. Aortic Atherosclerosis (ICD10-I70.0). Electronically Signed   By: Fidela Salisbury MD   On: 12/18/2019 13:25   DG CHEST PORT 1 VIEW  Result Date: 12/19/2019 CLINICAL DATA:  Rib fractures EXAM: PORTABLE CHEST 1 VIEW COMPARISON:  Yesterday FINDINGS: Atelectasis and small pleural effusion at the left base without interval change. No apical pneumothorax or convincing basal pneumothorax. Known left rib fractures. Normal heart size and stable aortic contours. Dual-chamber pacer leads from the left. IMPRESSION: Stable atelectasis and pleural fluid on the left. Electronically Signed   By: Monte Fantasia M.D.   On: 12/19/2019 09:51    Micro Results     Recent Results (from the past 240 hour(s))  Respiratory Panel by RT PCR (Flu A&B, Covid) - Nasopharyngeal Swab     Status: None   Collection Time: 12/18/19  2:03 PM   Specimen: Nasopharyngeal Swab  Result Value Ref Range Status   SARS Coronavirus 2 by RT PCR NEGATIVE NEGATIVE Final    Comment: (NOTE) SARS-CoV-2 target nucleic acids are NOT DETECTED.  The SARS-CoV-2 RNA is generally detectable in upper respiratoy specimens during the acute phase of infection. The lowest concentration of SARS-CoV-2 viral copies this assay can detect is 131 copies/mL. A negative result does not preclude SARS-Cov-2 infection and should not be used as the sole basis for treatment or other patient management decisions. A negative result may occur with  improper specimen collection/handling, submission of  specimen other than nasopharyngeal swab, presence of viral mutation(s) within the areas targeted by this assay, and inadequate number of viral copies (<131 copies/mL). A negative result must be combined with clinical observations, patient history, and epidemiological information. The expected result is Negative.  Fact Sheet for Patients:  PinkCheek.be  Fact Sheet for Healthcare Providers:  GravelBags.it  This test is no t yet approved or cleared by the [place] FDA and  has been authorized for detection and/or diagnosis of SARS-CoV-2 by FDA under an Emergency Use Authorization (  EUA). This EUA will remain  in effect (meaning this test can be used) for the duration of the COVID-19 declaration under Section 564(b)(1) of the Act, 21 U.S.C. section 360bbb-3(b)(1), unless the authorization is terminated or revoked sooner.     Influenza A by PCR NEGATIVE NEGATIVE Final   Influenza B by PCR NEGATIVE NEGATIVE Final    Comment: (NOTE) The Xpert Xpress SARS-CoV-2/FLU/RSV assay is intended as an aid in  the diagnosis of influenza from Nasopharyngeal swab specimens and  should not be used as a sole basis for treatment. Nasal washings and  aspirates are unacceptable for Xpert Xpress SARS-CoV-2/FLU/RSV  testing.  Fact Sheet for Patients: PinkCheek.be  Fact Sheet for Healthcare Providers: GravelBags.it  This test is not yet approved or cleared by the [place] FDA and  has been authorized for detection and/or diagnosis of SARS-CoV-2 by  FDA under an Emergency Use Authorization (EUA). This EUA will remain  in effect (meaning this test can be used) for the duration of the  Covid-19 declaration under Section 564(b)(1) of the Act, 21  U.S.C. section 360bbb-3(b)(1), unless the authorization is  terminated or revoked. Performed at [facility], [address] [place], [place] [address]     Today   Moran today has no headache, no chest or abdominal pain, no SOB ,no new weakness tingling or numbness, feels much better wants to go home today.     Objective   Blood pressure 122/67, pulse 72, temperature (!) 97.5 F (36.4 C), temperature source Oral, resp. rate 20, height 6' (1.829 m), weight 62.1 kg, SpO2 93 %.   Intake/Output Summary (Last 24 hours) at 12/22/2019 0952 Last data filed at 12/22/2019 0517 Gross per 24 hour  Intake --  Output 1250 ml  Net -1250 ml    Exam  Awake Alert, No new F.N deficits, Normal affect [place].AT,PERRAL Supple Neck,No JVD, No cervical lymphadenopathy appriciated.  Symmetrical Chest wall movement, Good air movement bilaterally, CTAB RRR,No Gallops,Rubs or new Murmurs, No Parasternal Heave +ve B.Sounds, Abd Soft, Non tender, No organomegaly appriciated, No rebound -guarding or rigidity. No Cyanosis, Clubbing or edema, No new Rash or bruise   Data Review   CBC w Diff:  Lab Results  Component Value Date   WBC 5.1 12/20/2019   HGB 9.7 (L) 12/20/2019   HGB 9.2 (L) 12/05/2013   HCT 31.4 (L) 12/20/2019   HCT 28.3 (L) 12/05/2013   PLT 201 12/20/2019   PLT 224 12/05/2013   LYMPHOPCT 20 12/18/2019   LYMPHOPCT 20.0 12/05/2013   MONOPCT 11 12/18/2019   MONOPCT 8.3 12/05/2013   EOSPCT 2 12/18/2019   EOSPCT 6.7 12/05/2013   BASOPCT 1 12/18/2019   BASOPCT 1.1 12/05/2013    CMP:  Lab Results  Component Value Date   NA 137 12/20/2019   NA 136 12/05/2013   K 4.5 12/20/2019   K 4.6 12/05/2013   CL 106 12/20/2019   CL 102 12/05/2013   CO2 23 12/20/2019   CO2 28 12/05/2013   BUN 25 (H) 12/20/2019   BUN 36 (H) 12/05/2013   CREATININE 1.44 (H) 12/20/2019   CREATININE 1.60 (H) 12/05/2013   PROT 6.7 04/16/2016   PROT 6.2 (L) 12/05/2013   ALBUMIN 3.9 04/16/2016   ALBUMIN 3.0 (L) 12/05/2013   BILITOT 0.6 04/16/2016   BILITOT 0.3 12/05/2013   ALKPHOS 49 04/16/2016   ALKPHOS 68  12/05/2013   AST 30 04/16/2016   AST 28 12/05/2013  ALT 17 04/16/2016   ALT 21 12/05/2013  .   Total Time in preparing paper work, data evaluation and todays exam - 78 minutes  Lala Lund M.D on 12/22/2019 at 9:52 AM  [facility]   Office  [phone]

## 2019-12-22 DIAGNOSIS — S2249XA Multiple fractures of ribs, unspecified side, initial encounter for closed fracture: Secondary | ICD-10-CM | POA: Diagnosis not present

## 2019-12-22 DIAGNOSIS — E441 Mild protein-calorie malnutrition: Secondary | ICD-10-CM | POA: Diagnosis not present

## 2019-12-22 DIAGNOSIS — R69 Illness, unspecified: Secondary | ICD-10-CM | POA: Diagnosis not present

## 2019-12-22 DIAGNOSIS — S2249XD Multiple fractures of ribs, unspecified side, subsequent encounter for fracture with routine healing: Secondary | ICD-10-CM | POA: Diagnosis not present

## 2019-12-22 DIAGNOSIS — M6281 Muscle weakness (generalized): Secondary | ICD-10-CM | POA: Diagnosis not present

## 2019-12-22 DIAGNOSIS — Z209 Contact with and (suspected) exposure to unspecified communicable disease: Secondary | ICD-10-CM | POA: Diagnosis not present

## 2019-12-22 DIAGNOSIS — R079 Chest pain, unspecified: Secondary | ICD-10-CM | POA: Diagnosis not present

## 2019-12-22 DIAGNOSIS — M2550 Pain in unspecified joint: Secondary | ICD-10-CM | POA: Diagnosis not present

## 2019-12-22 DIAGNOSIS — W19XXXA Unspecified fall, initial encounter: Secondary | ICD-10-CM | POA: Diagnosis not present

## 2019-12-22 DIAGNOSIS — Z7401 Bed confinement status: Secondary | ICD-10-CM | POA: Diagnosis not present

## 2019-12-22 DIAGNOSIS — I110 Hypertensive heart disease with heart failure: Secondary | ICD-10-CM | POA: Diagnosis not present

## 2019-12-22 DIAGNOSIS — I2510 Atherosclerotic heart disease of native coronary artery without angina pectoris: Secondary | ICD-10-CM | POA: Diagnosis not present

## 2019-12-22 DIAGNOSIS — E1121 Type 2 diabetes mellitus with diabetic nephropathy: Secondary | ICD-10-CM | POA: Diagnosis not present

## 2019-12-22 DIAGNOSIS — I959 Hypotension, unspecified: Secondary | ICD-10-CM | POA: Diagnosis not present

## 2019-12-22 DIAGNOSIS — R531 Weakness: Secondary | ICD-10-CM | POA: Diagnosis not present

## 2019-12-22 DIAGNOSIS — Z029 Encounter for administrative examinations, unspecified: Secondary | ICD-10-CM | POA: Diagnosis present

## 2019-12-22 DIAGNOSIS — R296 Repeated falls: Secondary | ICD-10-CM | POA: Diagnosis not present

## 2019-12-22 DIAGNOSIS — I495 Sick sinus syndrome: Secondary | ICD-10-CM | POA: Diagnosis not present

## 2019-12-22 DIAGNOSIS — I5032 Chronic diastolic (congestive) heart failure: Secondary | ICD-10-CM | POA: Diagnosis not present

## 2019-12-22 LAB — GLUCOSE, CAPILLARY
Glucose-Capillary: 109 mg/dL — ABNORMAL HIGH (ref 70–99)
Glucose-Capillary: 188 mg/dL — ABNORMAL HIGH (ref 70–99)

## 2019-12-22 LAB — SARS CORONAVIRUS 2 BY RT PCR (HOSPITAL ORDER, PERFORMED IN ~~LOC~~ HOSPITAL LAB): SARS Coronavirus 2: NEGATIVE

## 2019-12-22 NOTE — Progress Notes (Signed)
Occupational Therapy Treatment Patient Details Name: Vincent Jackson MRN: [id number] DOB: 1919-11-29 Today's Date: 12/22/2019    History of present illness 86 y.o. male with medical history significant of CAD status post CABG x3 in 1998 and subsequent stenting, sick sinus syndrome status post pacemaker, hypertension, hyperlipidemia, diabetes, CHF, COPD, CKD, aortic stenosis who presented following a fall. CT chest abdomen pelvis showed fractures of the left fourth rib 10 ribs and fractures of left 8 through 10 ribs in 2 locations    OT comments  Pt seen for OT follow up session with plans to d/c to SNF this date. Session focused on dressing in preparation for d/c. Pt completed bed mobility with min A to pull up to sitting. He was then able to complete LBD with min A for steadying support when standing to pull pants up over hips. He required use of RW for UE support during standing dressing tasks. He then was able to don his shirt with set up assist. Discussed fall prevention techniques since pts dynamic standing balance is compromised. D/c recs remain appropriate for SNF, will continue to follow.    Follow Up Recommendations  SNF;Supervision/Assistance - 24 hour    Equipment Recommendations  None recommended by OT    Recommendations for Other Services      Precautions / Restrictions Precautions Precautions: Fall Restrictions Weight Bearing Restrictions: No       Mobility Bed Mobility Overal bed mobility: Needs Assistance Bed Mobility: Supine to Sit     Supine to sit: Min assist     General bed mobility comments: assist to pull up at trunk to attain full upright sitting  Transfers Overall transfer level: Needs assistance Equipment used: Rolling walker (2 wheeled) Transfers: Sit to/from Stand Sit to Stand: Min assist         General transfer comment: assist to rise and steady from low surface. Reliant on external support to maintain static balance    Balance Overall  balance assessment: Needs assistance Sitting-balance support: No upper extremity supported;Feet supported Sitting balance-Leahy Scale: Good     Standing balance support: Bilateral upper extremity supported;During functional activity Standing balance-Leahy Scale: Poor Standing balance comment: reliant on external support on RW when completing LBD in sit>stand position                           ADL either performed or assessed with clinical judgement   ADL Overall ADL's : Needs assistance/impaired                 Upper Body Dressing : Set up;Sitting Upper Body Dressing Details (indicate cue type and reason): to don shirt Lower Body Dressing: Sit to/from stand;Minimal assistance Lower Body Dressing Details (indicate cue type and reason): to don pants; pt was able to thread pants onto BLEs- but required steadying assist without UE Support to pull pants up safely overhips and to secure suspenders Toilet Transfer: Minimal assistance;Ambulation;RW Toilet Transfer Details (indicate cue type and reason): for steady assist         Functional mobility during ADLs: Min guard;Minimal assistance;Cueing for safety;Rolling walker General ADL Comments: session focused on ADL in preparation for d/c     Vision Patient Visual Report: No change from baseline     Perception     Praxis      Cognition Arousal/Alertness: Awake/alert Behavior During Therapy: WFL for tasks assessed/performed Overall Cognitive Status: Within Functional Limits for tasks assessed  Exercises     Shoulder Instructions       General Comments      Pertinent Vitals/ Pain       Pain Assessment: Faces Faces Pain Scale: Hurts a little bit Pain Location: lt ribs Pain Descriptors / Indicators: Aching;Discomfort;Grimacing;Guarding Pain Intervention(s): Monitored during session;Repositioned  Home Living                                           Prior Functioning/Environment              Frequency  Min 2X/week        Progress Toward Goals  OT Goals(current goals can now be found in the care plan section)  Progress towards OT goals: Progressing toward goals  Acute Rehab OT Goals Patient Stated Goal: to get back to [facility] OT Goal Formulation: With patient/family Time For Goal Achievement: 01/02/20 Potential to Achieve Goals: Good  Plan Discharge plan remains appropriate    Co-evaluation                 AM-PAC OT "6 Clicks" Daily Activity     Outcome Measure   Help from another person eating meals?: None Help from another person taking care of personal grooming?: A Little Help from another person toileting, which includes using toliet, bedpan, or urinal?: A Little Help from another person bathing (including washing, rinsing, drying)?: A Little   Help from another person to put on and taking off regular lower body clothing?: A Little 6 Click Score: 16    End of Session Equipment Utilized During Treatment: Gait belt;Rolling walker  OT Visit Diagnosis: Unsteadiness on feet (R26.81);Repeated falls (R29.6);Muscle weakness (generalized) (M62.81);Pain Pain - Right/Left: Left Pain - part of body:  (chest (ribs))   Activity Tolerance Patient tolerated treatment well   Patient Left in bed;with call bell/phone within reach;with nursing/sitter in room   Nurse Communication Mobility status        Time: 1245-1300 OT Time Calculation (min): 15 min  Charges: OT General Charges $OT Visit: 1 Visit OT Treatments $Self Care/Home Management : 8-22 mins  Zenovia Jarred, MSOT, OTR/L [place] [facility] Office Number: [phone] Pager: [phone]  Zenovia Jarred 12/22/2019, 1:41 PM

## 2019-12-22 NOTE — Progress Notes (Signed)
Report called to [facility], given to Lionel December. Questions answered. Patient transferred to facility at 1505 with PTAR.

## 2019-12-22 NOTE — Care Management Important Message (Signed)
Important Message  Patient Details  Name: Vincent Jackson MRN: [id number] Date of Birth: 02-Nov-1919   Medicare Important Message Given:  Yes     Shelda Altes 12/22/2019, 9:34 AM

## 2019-12-22 NOTE — TOC Transition Note (Signed)
Transition of Care [facility]) - CM/SW Discharge Note   Patient Details  Name: Vincent Jackson MRN: [id number] Date of Birth: [birth year]  Transition of Care [facility]) CM/SW Contact:  Trula Ore, [place] Phone Number: 12/22/2019, 10:45 AM   Clinical Narrative:     Patient will DC to: [facility] SNF   Anticipated DC date: 12/22/2019  Family notified: [name]   Transport by: [name]  ?  Per MD patient ready for DC to [facility] . RN, patient, patient's family, and facility notified of DC. Discharge Summary sent to facility. RN given number for report tele#[phone] RM# [phone]. DC packet on chart.DNR signed and attached to DC packet. Ambulance transport requested for patient.  CSW signing off.  Final next level of care: Skilled Nursing Facility Barriers to Discharge: No Barriers Identified   Patient Goals and CMS Choice Patient states their goals for this hospitalization and ongoing recovery are:: to go to SNF CMS Medicare.gov Compare Post Acute Care list provided to:: Patient Choice offered to / list presented to : Patient  Discharge Placement              Patient chooses bed at:  [facility]) Patient to be transferred to facility by: [place] Name of family member notified: [name] Patient and family notified of of transfer: 12/22/19  Discharge Plan and Services     Post Acute Care Choice: [place]                               Social Determinants of Health (SDOH) Interventions     Readmission Risk Interventions No flowsheet data found.

## 2019-12-23 DIAGNOSIS — S2249XD Multiple fractures of ribs, unspecified side, subsequent encounter for fracture with routine healing: Secondary | ICD-10-CM | POA: Diagnosis not present

## 2019-12-23 DIAGNOSIS — I5032 Chronic diastolic (congestive) heart failure: Secondary | ICD-10-CM | POA: Diagnosis not present

## 2019-12-23 DIAGNOSIS — E441 Mild protein-calorie malnutrition: Secondary | ICD-10-CM | POA: Diagnosis not present

## 2019-12-23 DIAGNOSIS — R296 Repeated falls: Secondary | ICD-10-CM | POA: Diagnosis not present

## 2019-12-23 DIAGNOSIS — E1121 Type 2 diabetes mellitus with diabetic nephropathy: Secondary | ICD-10-CM | POA: Diagnosis not present

## 2019-12-28 ENCOUNTER — Other Ambulatory Visit
Admission: RE | Admit: 2019-12-28 | Discharge: 2019-12-28 | Disposition: A | Discharge status: Home / Self Care | Payer: Medicare HMO | Source: Skilled Nursing Facility | Attending: Internal Medicine | Admitting: Internal Medicine

## 2019-12-28 DIAGNOSIS — Z209 Contact with and (suspected) exposure to unspecified communicable disease: Secondary | ICD-10-CM | POA: Insufficient documentation

## 2019-12-28 LAB — CBC WITH DIFFERENTIAL/PLATELET
Abs Immature Granulocytes: 0.02 10*3/uL (ref 0.00–0.07)
Basophils Absolute: 0 10*3/uL (ref 0.0–0.1)
Basophils Relative: 1 %
Eosinophils Absolute: 0.3 10*3/uL (ref 0.0–0.5)
Eosinophils Relative: 5 %
HCT: 29.1 % — ABNORMAL LOW (ref 39.0–52.0)
Hemoglobin: 9.5 g/dL — ABNORMAL LOW (ref 13.0–17.0)
Immature Granulocytes: 0 %
Lymphocytes Relative: 22 %
Lymphs Abs: 1.4 10*3/uL (ref 0.7–4.0)
MCH: 30.7 pg (ref 26.0–34.0)
MCHC: 32.6 g/dL (ref 30.0–36.0)
MCV: 94.2 fL (ref 80.0–100.0)
Monocytes Absolute: 0.7 10*3/uL (ref 0.1–1.0)
Monocytes Relative: 11 %
Neutro Abs: 4 10*3/uL (ref 1.7–7.7)
Neutrophils Relative %: 61 %
Platelets: 219 10*3/uL (ref 150–400)
RBC: 3.09 MIL/uL — ABNORMAL LOW (ref 4.22–5.81)
RDW: 13.5 % (ref 11.5–15.5)
WBC: 6.5 10*3/uL (ref 4.0–10.5)
nRBC: 0 % (ref 0.0–0.2)

## 2019-12-28 LAB — COMPREHENSIVE METABOLIC PANEL
ALT: 9 U/L (ref 0–44)
AST: 18 U/L (ref 15–41)
Albumin: 3.2 g/dL — ABNORMAL LOW (ref 3.5–5.0)
Alkaline Phosphatase: 97 U/L (ref 38–126)
Anion gap: 9 (ref 5–15)
BUN: 34 mg/dL — ABNORMAL HIGH (ref 8–23)
CO2: 24 mmol/L (ref 22–32)
Calcium: 8.4 mg/dL — ABNORMAL LOW (ref 8.9–10.3)
Chloride: 103 mmol/L (ref 98–111)
Creatinine, Ser: 1.45 mg/dL — ABNORMAL HIGH (ref 0.61–1.24)
GFR, Estimated: 44 mL/min — ABNORMAL LOW (ref >60)
Glucose, Bld: 102 mg/dL — ABNORMAL HIGH (ref 70–99)
Potassium: 4.4 mmol/L (ref 3.5–5.1)
Sodium: 136 mmol/L (ref 135–145)
Total Bilirubin: 0.6 mg/dL (ref 0.3–1.2)
Total Protein: 6.4 g/dL — ABNORMAL LOW (ref 6.5–8.1)

## 2020-01-04 DIAGNOSIS — R079 Chest pain, unspecified: Secondary | ICD-10-CM | POA: Diagnosis not present

## 2020-01-12 ENCOUNTER — Other Ambulatory Visit: Payer: Self-pay

## 2020-01-12 NOTE — Patient Outreach (Signed)
[facility]) Care Management  01/12/2020  [name] December 08, 1919 [id number]   Telephone call to [facility] at [facility] to inquire about patient status.  Patient remains in health care at this time.  Plan: RN CM will close case.    Jone Baseman, RN, MSN [place] Management Care Management Coordinator Direct Line [phone] Cell [phone] Toll Free: [phone]  Fax: [phone]

## 2020-01-19 DIAGNOSIS — I2510 Atherosclerotic heart disease of native coronary artery without angina pectoris: Secondary | ICD-10-CM | POA: Diagnosis not present

## 2020-01-19 DIAGNOSIS — N183 Chronic kidney disease, stage 3 unspecified: Secondary | ICD-10-CM | POA: Diagnosis not present

## 2020-01-19 DIAGNOSIS — Z0000 Encounter for general adult medical examination without abnormal findings: Secondary | ICD-10-CM | POA: Diagnosis not present

## 2020-01-19 DIAGNOSIS — E1122 Type 2 diabetes mellitus with diabetic chronic kidney disease: Secondary | ICD-10-CM | POA: Diagnosis not present

## 2020-03-01 DIAGNOSIS — Z20828 Contact with and (suspected) exposure to other viral communicable diseases: Secondary | ICD-10-CM | POA: Diagnosis not present

## 2020-03-07 DIAGNOSIS — Z20828 Contact with and (suspected) exposure to other viral communicable diseases: Secondary | ICD-10-CM | POA: Diagnosis not present

## 2020-03-09 DIAGNOSIS — Z20828 Contact with and (suspected) exposure to other viral communicable diseases: Secondary | ICD-10-CM | POA: Diagnosis not present

## 2020-04-14 DIAGNOSIS — Z951 Presence of aortocoronary bypass graft: Secondary | ICD-10-CM | POA: Diagnosis not present

## 2020-04-14 DIAGNOSIS — I10 Essential (primary) hypertension: Secondary | ICD-10-CM | POA: Diagnosis not present

## 2020-04-14 DIAGNOSIS — Z23 Encounter for immunization: Secondary | ICD-10-CM | POA: Diagnosis not present

## 2020-04-14 DIAGNOSIS — Z950 Presence of cardiac pacemaker: Secondary | ICD-10-CM | POA: Diagnosis not present

## 2020-04-14 DIAGNOSIS — E1151 Type 2 diabetes mellitus with diabetic peripheral angiopathy without gangrene: Secondary | ICD-10-CM | POA: Diagnosis not present

## 2020-04-14 DIAGNOSIS — E782 Mixed hyperlipidemia: Secondary | ICD-10-CM | POA: Diagnosis not present

## 2020-04-14 DIAGNOSIS — Z9889 Other specified postprocedural states: Secondary | ICD-10-CM | POA: Diagnosis not present

## 2020-04-14 DIAGNOSIS — J431 Panlobular emphysema: Secondary | ICD-10-CM | POA: Diagnosis not present

## 2020-04-14 DIAGNOSIS — I350 Nonrheumatic aortic (valve) stenosis: Secondary | ICD-10-CM | POA: Diagnosis not present

## 2020-06-14 ENCOUNTER — Encounter
Admission: RE | Admit: 2020-06-14 | Discharge: 2020-06-14 | Disposition: A | Discharge status: Home / Self Care | Payer: Medicare HMO | Source: Ambulatory Visit | Attending: Internal Medicine | Admitting: Internal Medicine

## 2020-06-24 DIAGNOSIS — I350 Nonrheumatic aortic (valve) stenosis: Secondary | ICD-10-CM | POA: Diagnosis not present

## 2020-06-24 DIAGNOSIS — N1832 Chronic kidney disease, stage 3b: Secondary | ICD-10-CM | POA: Diagnosis not present

## 2020-06-24 DIAGNOSIS — E1151 Type 2 diabetes mellitus with diabetic peripheral angiopathy without gangrene: Secondary | ICD-10-CM | POA: Diagnosis not present

## 2020-06-24 DIAGNOSIS — J431 Panlobular emphysema: Secondary | ICD-10-CM | POA: Diagnosis not present

## 2020-08-12 DIAGNOSIS — R2681 Unsteadiness on feet: Secondary | ICD-10-CM | POA: Diagnosis not present

## 2020-08-12 DIAGNOSIS — S12100D Unspecified displaced fracture of second cervical vertebra, subsequent encounter for fracture with routine healing: Secondary | ICD-10-CM | POA: Diagnosis not present

## 2020-08-12 DIAGNOSIS — R262 Difficulty in walking, not elsewhere classified: Secondary | ICD-10-CM | POA: Diagnosis not present

## 2020-08-12 DIAGNOSIS — R2689 Other abnormalities of gait and mobility: Secondary | ICD-10-CM | POA: Diagnosis not present

## 2020-08-12 DIAGNOSIS — M6281 Muscle weakness (generalized): Secondary | ICD-10-CM | POA: Diagnosis not present

## 2020-08-12 DIAGNOSIS — Z9181 History of falling: Secondary | ICD-10-CM | POA: Diagnosis not present

## 2020-08-15 DIAGNOSIS — Z9181 History of falling: Secondary | ICD-10-CM | POA: Diagnosis not present

## 2020-08-15 DIAGNOSIS — M6281 Muscle weakness (generalized): Secondary | ICD-10-CM | POA: Diagnosis not present

## 2020-08-15 DIAGNOSIS — S12100D Unspecified displaced fracture of second cervical vertebra, subsequent encounter for fracture with routine healing: Secondary | ICD-10-CM | POA: Diagnosis not present

## 2020-08-15 DIAGNOSIS — R262 Difficulty in walking, not elsewhere classified: Secondary | ICD-10-CM | POA: Diagnosis not present

## 2020-08-15 DIAGNOSIS — R2681 Unsteadiness on feet: Secondary | ICD-10-CM | POA: Diagnosis not present

## 2020-08-15 DIAGNOSIS — R2689 Other abnormalities of gait and mobility: Secondary | ICD-10-CM | POA: Diagnosis not present

## 2020-08-16 DIAGNOSIS — R2681 Unsteadiness on feet: Secondary | ICD-10-CM | POA: Diagnosis not present

## 2020-08-16 DIAGNOSIS — R262 Difficulty in walking, not elsewhere classified: Secondary | ICD-10-CM | POA: Diagnosis not present

## 2020-08-16 DIAGNOSIS — Z9181 History of falling: Secondary | ICD-10-CM | POA: Diagnosis not present

## 2020-08-16 DIAGNOSIS — M6281 Muscle weakness (generalized): Secondary | ICD-10-CM | POA: Diagnosis not present

## 2020-08-16 DIAGNOSIS — R2689 Other abnormalities of gait and mobility: Secondary | ICD-10-CM | POA: Diagnosis not present

## 2020-08-16 DIAGNOSIS — S12100D Unspecified displaced fracture of second cervical vertebra, subsequent encounter for fracture with routine healing: Secondary | ICD-10-CM | POA: Diagnosis not present

## 2020-08-19 DIAGNOSIS — R262 Difficulty in walking, not elsewhere classified: Secondary | ICD-10-CM | POA: Diagnosis not present

## 2020-08-19 DIAGNOSIS — M6281 Muscle weakness (generalized): Secondary | ICD-10-CM | POA: Diagnosis not present

## 2020-08-19 DIAGNOSIS — R2681 Unsteadiness on feet: Secondary | ICD-10-CM | POA: Diagnosis not present

## 2020-08-19 DIAGNOSIS — S12100D Unspecified displaced fracture of second cervical vertebra, subsequent encounter for fracture with routine healing: Secondary | ICD-10-CM | POA: Diagnosis not present

## 2020-08-19 DIAGNOSIS — Z9181 History of falling: Secondary | ICD-10-CM | POA: Diagnosis not present

## 2020-08-19 DIAGNOSIS — R2689 Other abnormalities of gait and mobility: Secondary | ICD-10-CM | POA: Diagnosis not present

## 2020-08-23 ENCOUNTER — Emergency Department: Payer: Medicare HMO

## 2020-08-23 ENCOUNTER — Emergency Department
Admission: EM | Admit: 2020-08-23 | Discharge: 2020-08-25 | Disposition: A | Discharge status: Home / Self Care | Payer: Medicare HMO | Attending: Emergency Medicine | Admitting: Emergency Medicine

## 2020-08-23 ENCOUNTER — Encounter: Payer: Self-pay | Admitting: Emergency Medicine

## 2020-08-23 ENCOUNTER — Other Ambulatory Visit: Payer: Self-pay

## 2020-08-23 DIAGNOSIS — W010XXA Fall on same level from slipping, tripping and stumbling without subsequent striking against object, initial encounter: Secondary | ICD-10-CM | POA: Diagnosis not present

## 2020-08-23 DIAGNOSIS — Z8551 Personal history of malignant neoplasm of bladder: Secondary | ICD-10-CM | POA: Insufficient documentation

## 2020-08-23 DIAGNOSIS — Z79899 Other long term (current) drug therapy: Secondary | ICD-10-CM | POA: Diagnosis not present

## 2020-08-23 DIAGNOSIS — Z950 Presence of cardiac pacemaker: Secondary | ICD-10-CM | POA: Diagnosis not present

## 2020-08-23 DIAGNOSIS — I700 Atherosclerosis of aorta: Secondary | ICD-10-CM | POA: Diagnosis not present

## 2020-08-23 DIAGNOSIS — I2510 Atherosclerotic heart disease of native coronary artery without angina pectoris: Secondary | ICD-10-CM | POA: Diagnosis not present

## 2020-08-23 DIAGNOSIS — S0990XA Unspecified injury of head, initial encounter: Secondary | ICD-10-CM | POA: Diagnosis not present

## 2020-08-23 DIAGNOSIS — E1169 Type 2 diabetes mellitus with other specified complication: Secondary | ICD-10-CM | POA: Diagnosis not present

## 2020-08-23 DIAGNOSIS — Z87891 Personal history of nicotine dependence: Secondary | ICD-10-CM | POA: Insufficient documentation

## 2020-08-23 DIAGNOSIS — I110 Hypertensive heart disease with heart failure: Secondary | ICD-10-CM | POA: Insufficient documentation

## 2020-08-23 DIAGNOSIS — Z981 Arthrodesis status: Secondary | ICD-10-CM | POA: Diagnosis not present

## 2020-08-23 DIAGNOSIS — W19XXXA Unspecified fall, initial encounter: Secondary | ICD-10-CM

## 2020-08-23 DIAGNOSIS — Z794 Long term (current) use of insulin: Secondary | ICD-10-CM | POA: Diagnosis not present

## 2020-08-23 DIAGNOSIS — M7989 Other specified soft tissue disorders: Secondary | ICD-10-CM | POA: Diagnosis not present

## 2020-08-23 DIAGNOSIS — S51002A Unspecified open wound of left elbow, initial encounter: Secondary | ICD-10-CM

## 2020-08-23 DIAGNOSIS — I5030 Unspecified diastolic (congestive) heart failure: Secondary | ICD-10-CM | POA: Diagnosis not present

## 2020-08-23 DIAGNOSIS — Y9289 Other specified places as the place of occurrence of the external cause: Secondary | ICD-10-CM | POA: Insufficient documentation

## 2020-08-23 DIAGNOSIS — R2689 Other abnormalities of gait and mobility: Secondary | ICD-10-CM | POA: Insufficient documentation

## 2020-08-23 DIAGNOSIS — S12112A Nondisplaced Type II dens fracture, initial encounter for closed fracture: Secondary | ICD-10-CM | POA: Diagnosis not present

## 2020-08-23 DIAGNOSIS — G319 Degenerative disease of nervous system, unspecified: Secondary | ICD-10-CM | POA: Diagnosis not present

## 2020-08-23 DIAGNOSIS — R519 Headache, unspecified: Secondary | ICD-10-CM | POA: Diagnosis not present

## 2020-08-23 DIAGNOSIS — R0902 Hypoxemia: Secondary | ICD-10-CM | POA: Diagnosis not present

## 2020-08-23 DIAGNOSIS — S51012A Laceration without foreign body of left elbow, initial encounter: Secondary | ICD-10-CM

## 2020-08-23 DIAGNOSIS — S12120A Other displaced dens fracture, initial encounter for closed fracture: Secondary | ICD-10-CM

## 2020-08-23 DIAGNOSIS — R52 Pain, unspecified: Secondary | ICD-10-CM | POA: Diagnosis not present

## 2020-08-23 DIAGNOSIS — M542 Cervicalgia: Secondary | ICD-10-CM | POA: Diagnosis not present

## 2020-08-23 DIAGNOSIS — Z7982 Long term (current) use of aspirin: Secondary | ICD-10-CM | POA: Insufficient documentation

## 2020-08-23 DIAGNOSIS — S199XXA Unspecified injury of neck, initial encounter: Secondary | ICD-10-CM | POA: Diagnosis present

## 2020-08-23 DIAGNOSIS — I10 Essential (primary) hypertension: Secondary | ICD-10-CM | POA: Diagnosis not present

## 2020-08-23 DIAGNOSIS — M25422 Effusion, left elbow: Secondary | ICD-10-CM | POA: Diagnosis not present

## 2020-08-23 DIAGNOSIS — Z043 Encounter for examination and observation following other accident: Secondary | ICD-10-CM | POA: Diagnosis not present

## 2020-08-23 LAB — CBC WITH DIFFERENTIAL/PLATELET
Abs Immature Granulocytes: 0.03 10*3/uL (ref 0.00–0.07)
Basophils Absolute: 0.1 10*3/uL (ref 0.0–0.1)
Basophils Relative: 1 %
Eosinophils Absolute: 0.3 10*3/uL (ref 0.0–0.5)
Eosinophils Relative: 3 %
HCT: 32.8 % — ABNORMAL LOW (ref 39.0–52.0)
Hemoglobin: 10.7 g/dL — ABNORMAL LOW (ref 13.0–17.0)
Immature Granulocytes: 0 %
Lymphocytes Relative: 19 %
Lymphs Abs: 1.4 10*3/uL (ref 0.7–4.0)
MCH: 31.1 pg (ref 26.0–34.0)
MCHC: 32.6 g/dL (ref 30.0–36.0)
MCV: 95.3 fL (ref 80.0–100.0)
Monocytes Absolute: 0.5 10*3/uL (ref 0.1–1.0)
Monocytes Relative: 6 %
Neutro Abs: 5.4 10*3/uL (ref 1.7–7.7)
Neutrophils Relative %: 71 %
Platelets: 152 10*3/uL (ref 150–400)
RBC: 3.44 MIL/uL — ABNORMAL LOW (ref 4.22–5.81)
RDW: 14.1 % (ref 11.5–15.5)
WBC: 7.7 10*3/uL (ref 4.0–10.5)
nRBC: 0 % (ref 0.0–0.2)

## 2020-08-23 LAB — PROTIME-INR
INR: 1 (ref 0.8–1.2)
Prothrombin Time: 13.2 seconds (ref 11.4–15.2)

## 2020-08-23 LAB — BASIC METABOLIC PANEL
Anion gap: 7 (ref 5–15)
BUN: 39 mg/dL — ABNORMAL HIGH (ref 8–23)
CO2: 24 mmol/L (ref 22–32)
Calcium: 8.8 mg/dL — ABNORMAL LOW (ref 8.9–10.3)
Chloride: 106 mmol/L (ref 98–111)
Creatinine, Ser: 1.74 mg/dL — ABNORMAL HIGH (ref 0.61–1.24)
GFR, Estimated: 35 mL/min — ABNORMAL LOW (ref >60)
Glucose, Bld: 227 mg/dL — ABNORMAL HIGH (ref 70–99)
Potassium: 4.6 mmol/L (ref 3.5–5.1)
Sodium: 137 mmol/L (ref 135–145)

## 2020-08-23 MED ORDER — ACETAMINOPHEN 500 MG PO TABS
1000.0000 mg | ORAL_TABLET | Freq: Once | ORAL | Status: AC
  Administered 2020-08-23: 1000 mg via ORAL
  Filled 2020-08-23: qty 2

## 2020-08-23 NOTE — Discharge Instructions (Signed)
Vincent Jackson unfortunately broke the second cervical vertebrae, odontoid process, but does not need a surgery.  He will need to stay in the [facility] J collar at all times, 24/7, until he follows up with the neurosurgeons in the clinic in the next 1-2 weeks.  I have attached the phone number for Dr. Lacinda Axon, please call his clinic to schedule this appointment.   Large skin tear and abrasions to his left elbow.  No underlying fracture.  Please provide good wound care to this area to allow to heal and prevent infection.

## 2020-08-23 NOTE — ED Triage Notes (Addendum)
Pt arrived via [facility] from [place] [facility] [place]. Per EMS staff was doing their rounds at 7pm, states pt found on floor by staff was assisted to chair upon EMS arrival.  Pt states he was trying to get into the bed when he states his feet got tangled up and hit head on the wall.  Pt denies any dizziness prior to the fall.  Pt c/o neck pain, states he is unable to move his head to the L or R without pain. Per EMS pt denies any pain with palpation.   Pt does take ASA 81mg  daily.   Pt placed in c-collar by this RN on arrival.

## 2020-08-23 NOTE — ED Provider Notes (Signed)
[facility] Emergency Department Provider Note ____________________________________________   Event Date/Time   First MD Initiated Contact with Patient 08/23/20 2136     (approximate)  I have reviewed the triage vital signs and the nursing notes.  HISTORY  Chief Complaint Fall   HPI Vincent Jackson is a 87 y.o. Vincent Jackson presents to the ED for evaluation of fall and neck pain.   Chart review indicates history of CAD, HTN, HLD and DM.  No blood thinners.  Patient presents to the ED for evaluation of neck pain after a mechanical fall.  He was found down in his facility by staff.  He tells me that his feet just got tripped up and he fell to the ground, caused him to strike his head.  Denies syncope.  Reports neck pain since the fall, over the past couple hours.  Further reports skin tears and bleeding to his left elbow.  Reports his elbow feels fine now and does not think is broken.  No recent illnesses.  Past Medical History:  Diagnosis Date   CAD (coronary artery disease)    Cancer ([place])    Bladder cancer    Diabetes mellitus without complication ([place])    Diastolic heart failure ([place])    Hyperlipidemia    Prostate cancer [facility])     Patient Active Problem List   Diagnosis Date Noted   Flail chest 12/18/2019   Fall 05/19/2016   Obstruction of left tear duct 05/19/2016   Hip pain 04/16/2016   Viral sepsis ([place]) 03/12/2016   Influenza 03/12/2016   PVD (peripheral vascular disease) ([place]) 01/24/2016   Decreased pedal pulses 12/16/2015   History of bladder cancer 12/16/2015   CAD (coronary artery disease) 09/09/2015   Hyperlipidemia 09/09/2015   Diabetes ([place]) 09/09/2015    Past Surgical History:  Procedure Laterality Date   BLADDER REMOVAL  2007   MASTOIDECTOMY     PACEMAKER INSERTION Left 02/24/2019   Procedure: PACEMAKER  GENERATOR CHANGE OUT;  Surgeon: Isaias Cowman, MD;  Location: [facility] ORS;  Service: Cardiovascular;  Laterality: Left;    PROSTATE SURGERY  2007   removed    TONSILLECTOMY      Prior to Admission medications   Medication Sig Start Date End Date Taking? Authorizing Provider  acetaminophen (TYLENOL) 500 MG tablet Take 1 tablet (500 mg total) by mouth every 8 (eight) hours as needed for moderate pain. 12/21/19 12/20/20  Thurnell Lose, MD  aspirin EC 81 MG tablet Take 81 mg by mouth daily.     [provider]  Cyanocobalamin 500 MCG SUBL Place 500 mcg under the tongue daily. 08/07/16   [provider]  insulin aspart (NOVOLOG) 100 UNIT/ML injection Substitute to any brand approved.Before each meal 3 times a day, 140-199 - 2 units, 200-250 - 4 units, 251-299 - 6 units,  300-349 - 8 units,  350 or above 10 units. Dispense syringes and needles as needed, Ok to switch to PEN if approved. DX DM2, Code E11.65 12/21/19   Thurnell Lose, MD  latanoprost (XALATAN) 0.005 % ophthalmic solution Place 1 drop into both eyes at bedtime.  11/28/15   [provider]  metoprolol succinate (TOPROL-XL) 25 MG 24 hr tablet Take 25 mg by mouth daily.  07/05/15   [provider]  Multiple Vitamin (MULTIVITAMIN WITH MINERALS) TABS tablet Take 1 tablet by mouth daily.    [provider]  pantoprazole (PROTONIX) 40 MG tablet TAKE ONE TABLET EVERY MORNING FOR GERD Patient  taking differently: Take 40 mg by mouth daily.  08/21/16   Leone [place], MD  potassium chloride (KLOR-CON) 10 MEQ tablet Take 10 mEq by mouth daily. Patient not taking: Reported on 12/19/2019    [provider]  simvastatin (ZOCOR) 40 MG tablet Take 40 mg by mouth daily. Patient not taking: Reported on 12/19/2019 01/07/19   [provider]    Allergies Patient has no known allergies.  Family History  Problem Relation Age of Onset   Cataracts Other    Diabetes Other     Social History Social History   Tobacco Use   Smoking status: Former    Pack years: 0.00    Types: Cigarettes    Quit  date: 09/08/1997    Years since quitting: 22.9   Smokeless tobacco: Never  Substance Use Topics   Alcohol use: Yes    Alcohol/week: 0.0 standard drinks    Comment: Occasional    Drug use: No    Review of Systems  Constitutional: No fever/chills Eyes: No visual changes. ENT: No sore throat. Cardiovascular: Denies chest pain. Respiratory: Denies shortness of breath. Gastrointestinal: No abdominal pain.  No nausea, no vomiting.  No diarrhea.  No constipation. Genitourinary: Negative for dysuria. Musculoskeletal: Negative for back pain. Positive for posttraumatic neck pain and left elbow bleeding Skin: Negative for rash. Neurological: Negative for headaches, focal weakness or numbness. ____________________________________________   PHYSICAL EXAM:  VITAL SIGNS: Vitals:   08/23/20 2013  BP: (!) 151/84  Pulse: 91  Resp: 20  Temp: 98.5 F (36.9 C)  SpO2: 96%     Constitutional: Alert and oriented. Well appearing and in no acute distress. Eyes: Conjunctivae are normal. PERRL. EOMI. Head: Atraumatic. Nose: No congestion/rhinnorhea. Mouth/Throat: Mucous membranes are moist.  Oropharynx non-erythematous. Neck: No stridor.  Neck immobilized in [place] J collar Cardiovascular: Normal rate, regular rhythm. Grossly normal heart sounds.  Good peripheral circulation. Respiratory: Normal respiratory effort.  No retractions. Lungs CTAB. Gastrointestinal: Soft , nondistended, nontender to palpation. No CVA tenderness. Musculoskeletal: No lower extremity tenderness nor edema.  No joint effusions.  Large semicircular skin tear to the left lateral elbow, about 8-10 cm in total length.  Just a superficial skin tear with small portion of avulsion of this epidermis.  Hemostatic with direct pressure.  Underlying hematoma.  Full active and passive ROM of the left elbow and left arm is distally neurovascularly intact. Neurologic:  Normal speech and language. No gross focal neurologic deficits are  appreciated. Skin:  Skin is warm, dry and intact. No rash noted. Psychiatric: Mood and affect are normal. Speech and behavior are normal.  ____________________________________________   LABS (all labs ordered are listed, but only abnormal results are displayed)  Labs Reviewed  CBC WITH DIFFERENTIAL/PLATELET - Abnormal; Notable for the following components:      Result Value   RBC 3.44 (*)    Hemoglobin 10.7 (*)    HCT 32.8 (*)    All other components within normal limits  BASIC METABOLIC PANEL  PROTIME-INR   ____________________________________________  12 Lead EKG  Sinus rhythm, rate of 93 bpm.  Normal axis.  Left bundle.  No ischemia. ____________________________________________  RADIOLOGY  ED MD interpretation: CT of the cervical spine reviewed by me with type III odontoid fracture  Official radiology report(s): DG Cervical Spine Complete  Result Date: 08/23/2020 CLINICAL DATA:  Evaluate C2 stability EXAM: CERVICAL SPINE - COMPLETE 4+ VIEW COMPARISON:  CT cervical spine 08/23/2020 FINDINGS: The nondisplaced dens fracture seen on comparison CT  is poorly visualized radiographically due to a combination of suboptimal positioning and diffuse bony demineralization. No significant increase in displacement is radiographically apparent at this time though x-rays may be insufficient for further assessment and monitoring of this fracture. Diffuse bony fusion across the cervical levels. No additional fracture or traumatic malalignment is seen. Slight reversal the normal cervical lordosis is unchanged from comparison. No significant prevertebral swelling or gas. Airways patent. No acute abnormality in the upper chest or imaged lung apices. Aortic atherosclerosis. Pacer leads partially visualized. IMPRESSION: Stabilization collar in place. Nondisplaced fracture through the base of the dens is poorly visualized on radiography due to a combination of technical factors given suboptimal  positioning as well as diffuse bony demineralization. No gross change in alignment though radiography is significantly limited in the evaluation of this traumatic abnormality. Electronically Signed   By: Lovena Le M.D.   On: 08/23/2020 22:47   DG Elbow Complete Left  Result Date: 08/23/2020 CLINICAL DATA:  Fall EXAM: LEFT ELBOW - COMPLETE 3+ VIEW COMPARISON:  None. FINDINGS: The osseous structures appear diffusely demineralized which may limit detection of small or nondisplaced fractures. Largely corticated appearing fragmentation comprising much of the radial head, an acute on chronic injury is difficult to fully exclude. No other acute or conspicuous fragmentation. Diffuse soft tissue swelling of the elbow with joint effusion. More pronounced focal soft tissue swelling and thickening superficial to the olecranon, could reflect a combination of contusion/hematoma and traumatic bursitis. IMPRESSION: Corticated appearing fragmentation comprising much of the radial head likely reflecting sequela of prior injury though an acute on chronic injury cannot be fully excluded. Large elbow joint effusion and circumferential swelling. More focal thickening superficial to the olecranon, likely combination of contusion/hematoma and possible posttraumatic olecranon bursitis. Electronically Signed   By: Lovena Le M.D.   On: 08/23/2020 22:39   CT Head Wo Contrast  Result Date: 08/23/2020 CLINICAL DATA:  Unwitnessed fall EXAM: CT HEAD WITHOUT CONTRAST TECHNIQUE: Contiguous axial images were obtained from the base of the skull through the vertex without intravenous contrast. COMPARISON:  05/27/2008 FINDINGS: Brain: There is atrophy and chronic small vessel disease changes. No acute intracranial abnormality. Specifically, no hemorrhage, hydrocephalus, mass lesion, acute infarction, or significant intracranial injury. Vascular: No hyperdense vessel or unexpected calcification. Skull: No acute calvarial abnormality.  Sinuses/Orbits: No acute findings Other: None IMPRESSION: Atrophy, chronic microvascular disease. No acute intracranial abnormality. Electronically Signed   By: Rolm Baptise M.D.   On: 08/23/2020 21:27   CT Cervical Spine Wo Contrast  Result Date: 08/23/2020 CLINICAL DATA:  Found on floor EXAM: CT CERVICAL SPINE WITHOUT CONTRAST TECHNIQUE: Multidetector CT imaging of the cervical spine was performed without intravenous contrast. Multiplanar CT image reconstructions were also generated. COMPARISON:  None. FINDINGS: Alignment: No subluxation. Skull base and vertebrae: Diffuse osteopenia. There is a fracture through this C2 vertebral body which includes a type 2 odontoid fracture and extends into the right lateral mass. Lucency in the anterior arch of C1 felt to reflect nonunion rather than fracture. Soft tissues and spinal canal: No prevertebral fluid or swelling. No visible canal hematoma. Disc levels: Partial fusion across all the disc spaces. Diffuse degenerative disc and facet disease. Upper chest: No acute findings Other: None IMPRESSION: Fracture at the base of the odontoid with extension into the right lateral mass. This is nondisplaced. Partial fusion across the disc spaces with diffuse degenerative disc and facet disease. Diffuse osteopenia. Critical Value/emergent results were called by telephone at the time of interpretation on  08/23/2020 at 9:30 pm to provider [facility] , who verbally acknowledged these results. Electronically Signed   By: Rolm Baptise M.D.   On: 08/23/2020 21:32    ____________________________________________   PROCEDURES and INTERVENTIONS  Procedure(s) performed (including Critical Care):  .1-3 Lead EKG Interpretation  Date/Time: 08/23/2020 9:51 PM Performed by: Vladimir Crofts, MD Authorized by: Vladimir Crofts, MD     Interpretation: normal     ECG rate:  88   ECG rate assessment: normal     Rhythm: sinus rhythm     Ectopy: none     Conduction: normal     Medications  acetaminophen (TYLENOL) tablet 1,000 mg (1,000 mg Oral Given 08/23/20 2226)    ____________________________________________   MDM / ED COURSE   Pleasant 87 year old man presents to the ED with neck pain after a fall, with evidence of a type III odontoid fracture amenable to outpatient management.  He looks well without distress.  No evidence of neurologic or vascular deficits.  Does have a rather large skin tear and hematoma to his left elbow, without evidence of acute fracture beneath this.  Full active and passive ROM.  Nurses provide good wound care and reapproximate small area of avulsed skin with derma clips and Steri-Strips.  His pain is well controlled and I discussed the case with neurosurgery, who recommends outpatient management with close clinic follow-up.  Replace a [place] J cervical collar for stability and follow-up x-rays demonstrate continued alignment.  We will discharge with return precautions.  Clinical Course as of 08/23/20 2300  Tue Aug 23, 2020  2142 Dr. Lacinda Axon paged.  [DS]  2145 Immediate callback. He reviews images [DS]  2146 Keep him in the collar, get upright Xrays. Clinic in 2 weeks [DS]  2258 Reassessed.  Educated patient on reassuring imaging and my conversation with Dr. Lacinda Axon, we discussed keeping him in the collar and no need for surgery at this time. [DS]    Clinical Course User Index [DS] Vladimir Crofts, MD    ____________________________________________   FINAL CLINICAL IMPRESSION(S) / ED DIAGNOSES  Final diagnoses:  Closed odontoid fracture with type III morphology, initial encounter [facility])  Fall, initial encounter  Avulsion of skin of left elbow, initial encounter  Skin tear of left elbow without complication, initial encounter     ED Discharge Orders     None        Destyni Hoppel   Note:  This document was prepared using Dragon voice recognition software and may include unintentional dictation errors.    Vladimir Crofts,  MD 08/23/20 [phone]

## 2020-08-23 NOTE — ED Notes (Signed)
Patients urostomy full and leaking, urostomy drained of clear yellow urine, patient cleaned and given a hospital gown to go back to the facility with.  Wound care to skin tear on left elbow as follows: wound cleansed, applied steri strips, xeroform, 4x4's and then kerlex.  Patient tolerated the procedure well.  Patient instructed to not take the cervical collar off until see by the neuro surgeon.  Patient verbalized understanding.  Patients daughter [Jane] updated on the patients condition

## 2020-08-23 NOTE — ED Notes (Signed)
Spoke to the Nurse at the [facility], Nurse stated that the patient was in assisted living,.  Since the patient has a dressing on his left arm [skin tear] and a cervical collar that has to remain on until seen by the neuro surgeon, he would require skilled care, and there would not be a skilled care nurse until morning.  Nurse stated that she was going to call her DON to verify.  Charge Nurse [Raquel] was informed

## 2020-08-23 NOTE — ED Notes (Signed)
Returned from CT.

## 2020-08-24 MED ORDER — ACETAMINOPHEN 500 MG PO TABS
1000.0000 mg | ORAL_TABLET | Freq: Once | ORAL | Status: AC
  Administered 2020-08-24: 1000 mg via ORAL
  Filled 2020-08-24: qty 2

## 2020-08-24 NOTE — NC FL2 (Signed)
[place] LEVEL OF CARE SCREENING TOOL     IDENTIFICATION  Patient Name: Vincent Jackson Birthdate: May 05, 1919 Sex: male Admission Date (Current Location): 08/23/2020  [facility] and [place] Number:  Engineering geologist and Address:  [facility], [address], [place], [place] [address]      Provider Number: [id number]  Attending Physician Name and Address:  No att. providers found  Relative Name and Phone Number:  Pecola Leisure (Spouse)   [phone] [facility] Phone)    Current Level of Care: Hospital Recommended Level of Care: [place] Prior Approval Number:    Date Approved/Denied: 08/24/20 PASRR Number: [id number]  Discharge Plan: SNF    Current Diagnoses: Patient Active Problem List   Diagnosis Date Noted   Flail chest 12/18/2019   Fall 05/19/2016   Obstruction of left tear duct 05/19/2016   Hip pain 04/16/2016   Viral sepsis ([place]) 03/12/2016   Influenza 03/12/2016   PVD (peripheral vascular disease) ([place]) 01/24/2016   Decreased pedal pulses 12/16/2015   History of bladder cancer 12/16/2015   CAD (coronary artery disease) 09/09/2015   Hyperlipidemia 09/09/2015   Diabetes ([place]) 09/09/2015    Orientation RESPIRATION BLADDER Height & Weight     Self, Time, Situation, Place  Normal Continent Weight: 145 lb 8 oz (66 kg) Height:  6' (182.9 cm)  BEHAVIORAL SYMPTOMS/MOOD NEUROLOGICAL BOWEL NUTRITION STATUS      Continent Diet  AMBULATORY STATUS COMMUNICATION OF NEEDS Skin   Limited Assist Verbally Normal                       Personal Care Assistance Level of Assistance  Bathing, Feeding, Dressing, Total care Bathing Assistance: Limited assistance Feeding assistance: Limited assistance Dressing Assistance: Limited assistance Total Care Assistance: Limited assistance   Functional Limitations Info  Sight, Hearing, Speech Sight Info: Adequate Hearing Info: Adequate Speech Info: Adequate     SPECIAL CARE FACTORS FREQUENCY  OT (By licensed OT), PT (By licensed PT)     PT Frequency: 5X per week OT Frequency: 5X per week            Contractures Contractures Info: Not present    Additional Factors Info                  Current Medications (08/24/2020):  This is the current hospital active medication list No current facility-administered medications for this encounter.   Current Outpatient Medications  Medication Sig Dispense Refill   acetaminophen (TYLENOL) 500 MG tablet Take 1 tablet (500 mg total) by mouth every 8 (eight) hours as needed for moderate pain. 20 tablet 0   aspirin EC 81 MG tablet Take 81 mg by mouth daily.      Cyanocobalamin 500 MCG SUBL Place 500 mcg under the tongue daily.     insulin aspart (NOVOLOG) 100 UNIT/ML injection Substitute to any brand approved.Before each meal 3 times a day, 140-199 - 2 units, 200-250 - 4 units, 251-299 - 6 units,  300-349 - 8 units,  350 or above 10 units. Dispense syringes and needles as needed, Ok to switch to PEN if approved. DX DM2, Code E11.65  0   latanoprost (XALATAN) 0.005 % ophthalmic solution Place 1 drop into both eyes at bedtime.      metoprolol succinate (TOPROL-XL) 25 MG 24 hr tablet Take 25 mg by mouth daily.      Multiple Vitamin (MULTIVITAMIN WITH MINERALS) TABS tablet Take 1 tablet by mouth daily.  pantoprazole (PROTONIX) 40 MG tablet TAKE ONE TABLET EVERY MORNING FOR GERD (Patient taking differently: Take 40 mg by mouth daily. ) 30 tablet 2   potassium chloride (KLOR-CON) 10 MEQ tablet Take 10 mEq by mouth daily. (Patient not taking: Reported on 12/19/2019)     simvastatin (ZOCOR) 40 MG tablet Take 40 mg by mouth daily. (Patient not taking: Reported on 12/19/2019)       Discharge Medications: Please see discharge summary for a list of discharge medications.  Relevant Imaging Results:  Relevant Lab Results:   Additional Information SS# [id number]  Adelene Amas, LCSWA

## 2020-08-24 NOTE — ED Notes (Signed)
Spoke to the DON of the [facility].  DON stated that the patient would have to remain at the hospital through the night until morning because of the need for skilled nursing. ED Charge Nurse updated

## 2020-08-24 NOTE — Evaluation (Signed)
Physical Therapy Evaluation Patient Details Name: Vincent Jackson MRN: [id number] DOB: 1919/04/17 Today's Date: 08/24/2020   History of Present Illness  Vincent Jackson is a 89yoM who comes to [facility] on 08/23/20 after being found on floor by staff at [facility]. Pt report sgetting tangled up adn hit his head on the wall. PMH: CAD, HTN, HLD, DM, PrCA s/p urostomy.  Cervical CT report revealing "fracture at the base of the odontoid with extension into the right lateral mass. This is nondisplaced. Partial fusion across the disc spaces with diffuse degenerative disc and facet disease."  Clinical Impression  Pt admitted with above diagnosis. Pt currently with functional limitations due to the deficits listed below (see "PT Problem List"). Upon entry, pt in bed, awake and conditionally agreeable to participate. The pt is alert, pleasant, interactive, and able to provide some basic info regarding prior level of function, both in tolerance and independence. Today pt is fairly easily demontivated by neck pain when sitting upright or when standing. He demonstrates poor awareness of deficits and how they will create limitations at DC. Pt does not agree than he will have pain in the immediate future. Pt does not agree that getting up to AMB at this time has value nor is it indicative of any capability at DC. Pt reports "several recent falls," would strongly advise against close supervision at DC with all OOB activity, and skilled serviced to further assess balance impairment and potential for improvement. Patient's performance this date reveals decreased ability, independence, and tolerance in performing all basic mobility required for performance of activities of daily living. Pt requires additional DME, close physical assistance, and cues for safe participate in mobility. Pt will benefit from skilled PT intervention to increase independence and safety with basic mobility in preparation for discharge to the  venue listed below.       Follow Up Recommendations Supervision for mobility/OOB;SNF    Equipment Recommendations  None recommended by PT    Recommendations for Other Services       Precautions / Restrictions Precautions Precautions: Cervical;Fall Precaution Comments: urostomy Required Braces or Orthoses: Cervical Brace Cervical Brace: Hard collar;At all times Restrictions Weight Bearing Restrictions: No      Mobility  Bed Mobility Overal bed mobility: Needs Assistance Bed Mobility: Supine to Sit;Sit to Supine Rolling: Min guard   Supine to sit: Min guard     General bed mobility comments: needs minA for scooting up in bed    Transfers Overall transfer level: Needs assistance Equipment used: Rolling walker (2 wheeled) Transfers: Sit to/from Stand Sit to Stand: From elevated surface         General transfer comment: twice, requires encouragement due to pain  Ambulation/Gait Ambulation/Gait assistance: Min guard Gait Distance (Feet): 8 Feet Assistive device: Rolling walker (2 wheeled)       General Gait Details: really demontivated by neck pain, but responsive to encouragement; not really convinced of any value in AMB further at this time, not convinced of prognostic value of walking assessent at this time.  Stairs            Wheelchair Mobility    Modified Rankin (Stroke Patients Only)       Balance Overall balance assessment: Modified Independent;No apparent balance deficits (not formally assessed)  Pertinent Vitals/Pain Pain Assessment: 0-10 Pain Score: 5  (when upright or standing (no pain supine with pillow)) Pain Location: neck Pain Descriptors / Indicators: Aching;Discomfort Pain Intervention(s): Limited activity within patient's tolerance;Monitored during session    Home Living Family/patient expects to be discharged to:: Assisted living               Home  Equipment: Walker - 4 wheels      Prior Function Level of Independence: Independent with assistive device(s)         Comments: Pt reports living at assisted living in [place].     Hand Dominance   Dominant Hand: Right    Extremity/Trunk Assessment   Upper Extremity Assessment Upper Extremity Assessment: Generalized weakness;Overall [facility] for tasks assessed    Lower Extremity Assessment Lower Extremity Assessment: Generalized weakness;Overall WFL for tasks assessed       Communication   Communication: No difficulties  Cognition Arousal/Alertness: Awake/alert Behavior During Therapy: WFL for tasks assessed/performed Overall Cognitive Status: Within Functional Limits for tasks assessed              General Comments      Exercises     Assessment/Plan    PT Assessment Patient needs continued PT services  PT Problem List Decreased activity tolerance;Decreased mobility;Decreased knowledge of use of DME;Decreased safety awareness;Decreased knowledge of precautions       PT Treatment Interventions DME instruction;Gait training;Functional mobility training;Therapeutic activities;Therapeutic exercise;Patient/family education    PT Goals (Current goals can be found in the Care Plan section)  Acute Rehab PT Goals Patient Stated Goal: to get something to eat and decrease pain PT Goal Formulation: With patient Time For Goal Achievement: 09/07/20 Potential to Achieve Goals: Good    Frequency Min 2X/week   Barriers to discharge Decreased caregiver support      Co-evaluation               AM-PAC PT "6 Clicks" Mobility  Outcome Measure Help needed turning from your back to your side while in a flat bed without using bedrails?: A Little Help needed moving from lying on your back to sitting on the side of a flat bed without using bedrails?: A Little Help needed moving to and from a bed to a chair (including a wheelchair)?: A Little Help needed standing up  from a chair using your arms (e.g., wheelchair or bedside chair)?: A Little Help needed to walk in hospital room?: A Lot Help needed climbing 3-5 steps with a railing? : A Lot 6 Click Score: 16    End of Session Equipment Utilized During Treatment: Cervical collar Activity Tolerance: Patient limited by pain;Patient tolerated treatment well Patient left: in bed;with call bell/phone within reach   PT Visit Diagnosis: Difficulty in walking, not elsewhere classified (R26.2);Other abnormalities of gait and mobility (R26.89);Muscle weakness (generalized) (M62.81);Repeated falls (R29.6)    Time: 9458-5929 PT Time Calculation (min) (ACUTE ONLY): 18 min   Charges:   PT Evaluation $PT Eval Low Complexity: 1 Low         4:53 PM, 08/24/20 Etta Grandchild, PT, DPT Physical Therapist - [facility]  [phone] ([place])     Rosser C 08/24/2020, 4:48 PM

## 2020-08-24 NOTE — ED Notes (Signed)
Spoke with DON at [facility] [phone]) who states they do have a bed for this patient in the skilled nursing unit. Will order social work consult to get paper work done and get patient transferred to this area in am.

## 2020-08-24 NOTE — TOC Initial Note (Signed)
Transition of Care [facility]) - Initial/Assessment Note    Patient Details  Name: NIKO PENSON MRN: [id number] Date of Birth: Jul 25, 1919  [facility]) CM/SW Contact:    Anselm Pancoast, RN Phone Number: 08/24/2020, 9:59 AM  Clinical Narrative:                 Anticipate transfer back to [facility] under skilled care. Will need PT consult for insurance authorization.         Patient Goals and CMS Choice        Expected Discharge Plan and Services                                                Prior Living Arrangements/Services                       Activities of Daily Living      Permission Sought/Granted                  Emotional Assessment              Admission diagnosis:  Fall/Neck Injury  EMS Patient Active Problem List   Diagnosis Date Noted   Flail chest 12/18/2019   Fall 05/19/2016   Obstruction of left tear duct 05/19/2016   Hip pain 04/16/2016   Viral sepsis ([place]) 03/12/2016   Influenza 03/12/2016   PVD (peripheral vascular disease) ([place]) 01/24/2016   Decreased pedal pulses 12/16/2015   History of bladder cancer 12/16/2015   CAD (coronary artery disease) 09/09/2015   Hyperlipidemia 09/09/2015   Diabetes ([place]) 09/09/2015   PCP:  Rusty Aus, MD Pharmacy:   [place], [place] - [address] [place] [address] Phone: [phone] Fax: [phone]  [facility] [place], [place] - [address] [place] [address] Phone: [phone] Fax: [phone]     Social Determinants of Health (SDOH) Interventions    Readmission Risk Interventions No flowsheet data found.

## 2020-08-24 NOTE — ED Notes (Signed)
Pt called, Diplomatic Services operational officer responded and pt asked help use the bathroom. This RN went there immediately to assist, pt then states "not right now. I have company, this is my stepson".

## 2020-08-24 NOTE — ED Notes (Signed)
Provided phone for pt to talk to wife. Pt had called on call bell and wanted to know why he wasn't being sent home yet. Explained that getting patients home to their facilities is a process. Pt said "well getting me here was not a process". Pt now on phone with wife.

## 2020-08-24 NOTE — Evaluation (Signed)
Occupational Therapy Evaluation Patient Details Name: Vincent Jackson MRN: [id number] DOB: 04/02/1919 Today's Date: 08/24/2020    History of Present Illness 87 y.o. Vincent Jackson presents to the ED for evaluation of fall and neck pain with new dens fx.   Clinical Impression   Patient presenting with decreased I in self care, balance, functional mobility/transfer, endurance, and safety awareness. Patient reports living at ALF and use of rollator for funtional mobility PTA. He does endorse performing ADLs independently and staff to assist with bringing him meals and medications. Pt does not demonstrate any significant weakness or sensation change in UEs or LEs. Pt comes into long sitting from slightly elevated HOB without assistance and when asked to come to EOB pt returns to supine. He verbalized, " No, my neck hurts too much and I want something to eat and drink". RN notified and OT encouraged pt to participate as staff reports earlier pt requesting to get up and ambulate to commode. Pt declined further mobility/self car secondary to pain.Patient will benefit from acute OT to increase overall independence in the areas of ADLs, functional mobility, and safety awareness in order to safely discharge to next venue of care.    Follow Up Recommendations  SNF    Equipment Recommendations  Other (comment) (defer to next venue of care)       Precautions / Restrictions Precautions Precautions: Cervical;Fall Precaution Comments: colostomy Required Braces or Orthoses: Cervical Brace Cervical Brace: Hard collar (miami hard collar) Restrictions Weight Bearing Restrictions: No      Mobility Bed Mobility Overal bed mobility: Needs Assistance Bed Mobility: Rolling Rolling: Supervision         General bed mobility comments: long sitting without assistance but supervision for safety.    Transfers                 General transfer comment: Pt refused secondary to neck pain.        ADL  either performed or assessed with clinical judgement   ADL Overall ADL's : Needs assistance/impaired                                       General ADL Comments: likely min - mod A for UB and LB self care. Grooming with set up A to obtain needed items.     Vision Patient Visual Report: No change from baseline              Pertinent Vitals/Pain Pain Assessment: Faces Faces Pain Scale: Hurts even more Pain Location: neck Pain Descriptors / Indicators: Aching;Discomfort Pain Intervention(s): Limited activity within patient's tolerance;Repositioned;Monitored during session     Hand Dominance Right   Extremity/Trunk Assessment Upper Extremity Assessment Upper Extremity Assessment: Generalized weakness;Overall [facility] for tasks assessed   Lower Extremity Assessment Lower Extremity Assessment: Overall WFL for tasks assessed;Generalized weakness       Communication Communication Communication: No difficulties   Cognition Arousal/Alertness: Awake/alert Behavior During Therapy: WFL for tasks assessed/performed Overall Cognitive Status: No family/caregiver present to determine baseline cognitive functioning                                 General Comments: Pt oriented to self , situation, and location. He reports it being August. Pt is cooperative but does endorse pain with movement.  Home Living Family/patient expects to be discharged to:: Skilled nursing facility                                        Prior Functioning/Environment Level of Independence: Independent with assistive device(s)        Comments: Pt reports living at assisted living in [place].        OT Problem List: Decreased strength;Decreased activity tolerance;Impaired balance (sitting and/or standing);Decreased safety awareness;Pain;Decreased knowledge of precautions      OT Treatment/Interventions: Self-care/ADL training;Manual  therapy;Therapeutic exercise;Modalities;Patient/family education;Balance training;Energy conservation;Therapeutic activities;DME and/or AE instruction;Cognitive remediation/compensation    OT Goals(Current goals can be found in the care plan section) Acute Rehab OT Goals Patient Stated Goal: to get something to eat and decrease pain OT Goal Formulation: With patient Time For Goal Achievement: 09/07/20 Potential to Achieve Goals: Good ADL Goals Pt Will Perform Grooming: with modified independence;standing Pt Will Perform Lower Body Dressing: with modified independence;sit to/from stand Pt Will Transfer to Toilet: with modified independence;ambulating Pt Will Perform Toileting - Clothing Manipulation and hygiene: with modified independence;sitting/lateral leans  OT Frequency: Min 2X/week   Barriers to D/C:    none known at this time          AM-PAC OT "6 Clicks" Daily Activity     Outcome Measure Help from another person eating meals?: None Help from another person taking care of personal grooming?: A Little Help from another person toileting, which includes using toliet, bedpan, or urinal?: A Little Help from another person bathing (including washing, rinsing, drying)?: A Little Help from another person to put on and taking off regular upper body clothing?: A Little Help from another person to put on and taking off regular lower body clothing?: A Little 6 Click Score: 19   End of Session Nurse Communication: Mobility status;Patient requests pain meds  Activity Tolerance: Patient limited by pain Patient left: in bed;with call bell/phone within reach;with bed alarm set  OT Visit Diagnosis: Unsteadiness on feet (R26.81);History of falling (Z91.81);Repeated falls (R29.6);Muscle weakness (generalized) (M62.81)                Time: 1030-1050 OT Time Calculation (min): 20 min Charges:  OT General Charges $OT Visit: 1 Visit OT Evaluation $OT Eval Low Complexity: 1 Low OT  Treatments $Therapeutic Activity: 8-22 mins  Darleen Crocker, MS, OTR/L , CBIS ascom [phone]  08/24/20, 11:01 AM

## 2020-08-24 NOTE — ED Notes (Signed)
Pt called, wondering when he's going home. States "I was doing okay at the facility and then they took me to get more xrays and now I'm stuck here. I want to go home now". Updated patient. Reached out to SW, states PT/OT has to see patient first.

## 2020-08-24 NOTE — ED Notes (Signed)
Daughter [name] called again, updated. Gave pt a phone to talk to daughter.

## 2020-08-25 DIAGNOSIS — Z9181 History of falling: Secondary | ICD-10-CM | POA: Diagnosis not present

## 2020-08-25 DIAGNOSIS — M6281 Muscle weakness (generalized): Secondary | ICD-10-CM | POA: Diagnosis not present

## 2020-08-25 DIAGNOSIS — R2689 Other abnormalities of gait and mobility: Secondary | ICD-10-CM | POA: Diagnosis not present

## 2020-08-25 DIAGNOSIS — S12100D Unspecified displaced fracture of second cervical vertebra, subsequent encounter for fracture with routine healing: Secondary | ICD-10-CM | POA: Diagnosis not present

## 2020-08-25 DIAGNOSIS — R262 Difficulty in walking, not elsewhere classified: Secondary | ICD-10-CM | POA: Diagnosis not present

## 2020-08-25 DIAGNOSIS — R2681 Unsteadiness on feet: Secondary | ICD-10-CM | POA: Diagnosis not present

## 2020-08-25 NOTE — ED Notes (Signed)
Pt urostomy bag noted to leak, emptied at this time. Pt linens cleaned and pt cleaned up. Pt's urostomy bag replaced.

## 2020-08-25 NOTE — TOC Transition Note (Addendum)
Transition of Care [facility]) - CM/SW Discharge Note   Patient Details  Name: Vincent Jackson MRN: [id number] Date of Birth: Feb 07, 1920  Transition of Care [facility]) CM/SW Contact:  Anselm Pancoast, RN Phone Number: 08/25/2020, 11:24 AM   Clinical Narrative:    Spoke with [facility] confirmed patient and family are requesting patient return to ALF and they will set up [facility] services to [place] will transport patient back once discharge complete.   Patient refused SNF placement but agrees to ALF with [facility].   Transport will be at hospital to pick up patient at 2pm and transfer back to ALF. Family is aware. No other needs.          Patient Goals and CMS Choice        Discharge Placement                       Discharge Plan and Services                                     Social Determinants of Health (SDOH) Interventions     Readmission Risk Interventions No flowsheet data found.

## 2020-08-25 NOTE — ED Notes (Signed)
Emptied urostomy. 250cc urine

## 2020-08-25 NOTE — Progress Notes (Signed)
[facility] will set up Vincent Jackson once orders completed.

## 2020-08-26 DIAGNOSIS — Z9181 History of falling: Secondary | ICD-10-CM | POA: Diagnosis not present

## 2020-08-26 DIAGNOSIS — S12100D Unspecified displaced fracture of second cervical vertebra, subsequent encounter for fracture with routine healing: Secondary | ICD-10-CM | POA: Diagnosis not present

## 2020-08-26 DIAGNOSIS — R262 Difficulty in walking, not elsewhere classified: Secondary | ICD-10-CM | POA: Diagnosis not present

## 2020-08-26 DIAGNOSIS — M6281 Muscle weakness (generalized): Secondary | ICD-10-CM | POA: Diagnosis not present

## 2020-08-26 DIAGNOSIS — S12120D Other displaced dens fracture, subsequent encounter for fracture with routine healing: Secondary | ICD-10-CM | POA: Diagnosis not present

## 2020-08-26 DIAGNOSIS — R2689 Other abnormalities of gait and mobility: Secondary | ICD-10-CM | POA: Diagnosis not present

## 2020-08-26 DIAGNOSIS — R2681 Unsteadiness on feet: Secondary | ICD-10-CM | POA: Diagnosis not present

## 2020-08-29 DIAGNOSIS — S12120D Other displaced dens fracture, subsequent encounter for fracture with routine healing: Secondary | ICD-10-CM | POA: Diagnosis not present

## 2020-08-29 DIAGNOSIS — R262 Difficulty in walking, not elsewhere classified: Secondary | ICD-10-CM | POA: Diagnosis not present

## 2020-08-29 DIAGNOSIS — M6281 Muscle weakness (generalized): Secondary | ICD-10-CM | POA: Diagnosis not present

## 2020-08-29 DIAGNOSIS — R2689 Other abnormalities of gait and mobility: Secondary | ICD-10-CM | POA: Diagnosis not present

## 2020-08-29 DIAGNOSIS — S12100D Unspecified displaced fracture of second cervical vertebra, subsequent encounter for fracture with routine healing: Secondary | ICD-10-CM | POA: Diagnosis not present

## 2020-08-29 DIAGNOSIS — R2681 Unsteadiness on feet: Secondary | ICD-10-CM | POA: Diagnosis not present

## 2020-08-29 DIAGNOSIS — Z9181 History of falling: Secondary | ICD-10-CM | POA: Diagnosis not present

## 2020-08-30 DIAGNOSIS — S12120D Other displaced dens fracture, subsequent encounter for fracture with routine healing: Secondary | ICD-10-CM | POA: Diagnosis not present

## 2020-08-30 DIAGNOSIS — Z9181 History of falling: Secondary | ICD-10-CM | POA: Diagnosis not present

## 2020-08-30 DIAGNOSIS — R2689 Other abnormalities of gait and mobility: Secondary | ICD-10-CM | POA: Diagnosis not present

## 2020-08-30 DIAGNOSIS — R2681 Unsteadiness on feet: Secondary | ICD-10-CM | POA: Diagnosis not present

## 2020-08-30 DIAGNOSIS — R262 Difficulty in walking, not elsewhere classified: Secondary | ICD-10-CM | POA: Diagnosis not present

## 2020-08-30 DIAGNOSIS — S12100D Unspecified displaced fracture of second cervical vertebra, subsequent encounter for fracture with routine healing: Secondary | ICD-10-CM | POA: Diagnosis not present

## 2020-08-30 DIAGNOSIS — E1151 Type 2 diabetes mellitus with diabetic peripheral angiopathy without gangrene: Secondary | ICD-10-CM | POA: Diagnosis not present

## 2020-08-30 DIAGNOSIS — M6281 Muscle weakness (generalized): Secondary | ICD-10-CM | POA: Diagnosis not present

## 2020-08-30 DIAGNOSIS — I350 Nonrheumatic aortic (valve) stenosis: Secondary | ICD-10-CM | POA: Diagnosis not present

## 2020-09-01 DIAGNOSIS — R262 Difficulty in walking, not elsewhere classified: Secondary | ICD-10-CM | POA: Diagnosis not present

## 2020-09-01 DIAGNOSIS — R2689 Other abnormalities of gait and mobility: Secondary | ICD-10-CM | POA: Diagnosis not present

## 2020-09-01 DIAGNOSIS — R2681 Unsteadiness on feet: Secondary | ICD-10-CM | POA: Diagnosis not present

## 2020-09-01 DIAGNOSIS — S12120D Other displaced dens fracture, subsequent encounter for fracture with routine healing: Secondary | ICD-10-CM | POA: Diagnosis not present

## 2020-09-01 DIAGNOSIS — Z9181 History of falling: Secondary | ICD-10-CM | POA: Diagnosis not present

## 2020-09-01 DIAGNOSIS — S12100D Unspecified displaced fracture of second cervical vertebra, subsequent encounter for fracture with routine healing: Secondary | ICD-10-CM | POA: Diagnosis not present

## 2020-09-01 DIAGNOSIS — M6281 Muscle weakness (generalized): Secondary | ICD-10-CM | POA: Diagnosis not present

## 2020-09-02 DIAGNOSIS — Z9181 History of falling: Secondary | ICD-10-CM | POA: Diagnosis not present

## 2020-09-02 DIAGNOSIS — R262 Difficulty in walking, not elsewhere classified: Secondary | ICD-10-CM | POA: Diagnosis not present

## 2020-09-02 DIAGNOSIS — R2689 Other abnormalities of gait and mobility: Secondary | ICD-10-CM | POA: Diagnosis not present

## 2020-09-02 DIAGNOSIS — S12120D Other displaced dens fracture, subsequent encounter for fracture with routine healing: Secondary | ICD-10-CM | POA: Diagnosis not present

## 2020-09-02 DIAGNOSIS — R2681 Unsteadiness on feet: Secondary | ICD-10-CM | POA: Diagnosis not present

## 2020-09-02 DIAGNOSIS — M6281 Muscle weakness (generalized): Secondary | ICD-10-CM | POA: Diagnosis not present

## 2020-09-02 DIAGNOSIS — S12100D Unspecified displaced fracture of second cervical vertebra, subsequent encounter for fracture with routine healing: Secondary | ICD-10-CM | POA: Diagnosis not present

## 2020-09-05 DIAGNOSIS — R262 Difficulty in walking, not elsewhere classified: Secondary | ICD-10-CM | POA: Diagnosis not present

## 2020-09-05 DIAGNOSIS — S12120D Other displaced dens fracture, subsequent encounter for fracture with routine healing: Secondary | ICD-10-CM | POA: Diagnosis not present

## 2020-09-05 DIAGNOSIS — R2681 Unsteadiness on feet: Secondary | ICD-10-CM | POA: Diagnosis not present

## 2020-09-05 DIAGNOSIS — R2689 Other abnormalities of gait and mobility: Secondary | ICD-10-CM | POA: Diagnosis not present

## 2020-09-05 DIAGNOSIS — S12100D Unspecified displaced fracture of second cervical vertebra, subsequent encounter for fracture with routine healing: Secondary | ICD-10-CM | POA: Diagnosis not present

## 2020-09-05 DIAGNOSIS — Z9181 History of falling: Secondary | ICD-10-CM | POA: Diagnosis not present

## 2020-09-05 DIAGNOSIS — M6281 Muscle weakness (generalized): Secondary | ICD-10-CM | POA: Diagnosis not present

## 2020-09-06 DIAGNOSIS — S12100D Unspecified displaced fracture of second cervical vertebra, subsequent encounter for fracture with routine healing: Secondary | ICD-10-CM | POA: Diagnosis not present

## 2020-09-06 DIAGNOSIS — M6281 Muscle weakness (generalized): Secondary | ICD-10-CM | POA: Diagnosis not present

## 2020-09-06 DIAGNOSIS — R2689 Other abnormalities of gait and mobility: Secondary | ICD-10-CM | POA: Diagnosis not present

## 2020-09-06 DIAGNOSIS — R262 Difficulty in walking, not elsewhere classified: Secondary | ICD-10-CM | POA: Diagnosis not present

## 2020-09-06 DIAGNOSIS — S12120D Other displaced dens fracture, subsequent encounter for fracture with routine healing: Secondary | ICD-10-CM | POA: Diagnosis not present

## 2020-09-06 DIAGNOSIS — R2681 Unsteadiness on feet: Secondary | ICD-10-CM | POA: Diagnosis not present

## 2020-09-06 DIAGNOSIS — Z9181 History of falling: Secondary | ICD-10-CM | POA: Diagnosis not present

## 2020-09-08 DIAGNOSIS — R2681 Unsteadiness on feet: Secondary | ICD-10-CM | POA: Diagnosis not present

## 2020-09-08 DIAGNOSIS — Z9181 History of falling: Secondary | ICD-10-CM | POA: Diagnosis not present

## 2020-09-08 DIAGNOSIS — R2689 Other abnormalities of gait and mobility: Secondary | ICD-10-CM | POA: Diagnosis not present

## 2020-09-08 DIAGNOSIS — S12100D Unspecified displaced fracture of second cervical vertebra, subsequent encounter for fracture with routine healing: Secondary | ICD-10-CM | POA: Diagnosis not present

## 2020-09-08 DIAGNOSIS — S12120D Other displaced dens fracture, subsequent encounter for fracture with routine healing: Secondary | ICD-10-CM | POA: Diagnosis not present

## 2020-09-08 DIAGNOSIS — M6281 Muscle weakness (generalized): Secondary | ICD-10-CM | POA: Diagnosis not present

## 2020-09-08 DIAGNOSIS — R262 Difficulty in walking, not elsewhere classified: Secondary | ICD-10-CM | POA: Diagnosis not present

## 2020-09-09 DIAGNOSIS — R2689 Other abnormalities of gait and mobility: Secondary | ICD-10-CM | POA: Diagnosis not present

## 2020-09-09 DIAGNOSIS — R262 Difficulty in walking, not elsewhere classified: Secondary | ICD-10-CM | POA: Diagnosis not present

## 2020-09-09 DIAGNOSIS — S12120D Other displaced dens fracture, subsequent encounter for fracture with routine healing: Secondary | ICD-10-CM | POA: Diagnosis not present

## 2020-09-09 DIAGNOSIS — Z9181 History of falling: Secondary | ICD-10-CM | POA: Diagnosis not present

## 2020-09-09 DIAGNOSIS — R2681 Unsteadiness on feet: Secondary | ICD-10-CM | POA: Diagnosis not present

## 2020-09-09 DIAGNOSIS — M6281 Muscle weakness (generalized): Secondary | ICD-10-CM | POA: Diagnosis not present

## 2020-09-09 DIAGNOSIS — S12100D Unspecified displaced fracture of second cervical vertebra, subsequent encounter for fracture with routine healing: Secondary | ICD-10-CM | POA: Diagnosis not present

## 2020-09-12 DIAGNOSIS — L97519 Non-pressure chronic ulcer of other part of right foot with unspecified severity: Secondary | ICD-10-CM | POA: Diagnosis not present

## 2020-09-12 DIAGNOSIS — S12120D Other displaced dens fracture, subsequent encounter for fracture with routine healing: Secondary | ICD-10-CM | POA: Diagnosis not present

## 2020-09-12 DIAGNOSIS — R2689 Other abnormalities of gait and mobility: Secondary | ICD-10-CM | POA: Diagnosis not present

## 2020-09-12 DIAGNOSIS — Z9181 History of falling: Secondary | ICD-10-CM | POA: Diagnosis not present

## 2020-09-12 DIAGNOSIS — R262 Difficulty in walking, not elsewhere classified: Secondary | ICD-10-CM | POA: Diagnosis not present

## 2020-09-12 DIAGNOSIS — M6281 Muscle weakness (generalized): Secondary | ICD-10-CM | POA: Diagnosis not present

## 2020-09-12 DIAGNOSIS — S12100D Unspecified displaced fracture of second cervical vertebra, subsequent encounter for fracture with routine healing: Secondary | ICD-10-CM | POA: Diagnosis not present

## 2020-09-12 DIAGNOSIS — R2681 Unsteadiness on feet: Secondary | ICD-10-CM | POA: Diagnosis not present

## 2020-09-13 DIAGNOSIS — R2681 Unsteadiness on feet: Secondary | ICD-10-CM | POA: Diagnosis not present

## 2020-09-13 DIAGNOSIS — S12100D Unspecified displaced fracture of second cervical vertebra, subsequent encounter for fracture with routine healing: Secondary | ICD-10-CM | POA: Diagnosis not present

## 2020-09-13 DIAGNOSIS — S12100A Unspecified displaced fracture of second cervical vertebra, initial encounter for closed fracture: Secondary | ICD-10-CM | POA: Diagnosis not present

## 2020-09-13 DIAGNOSIS — M4312 Spondylolisthesis, cervical region: Secondary | ICD-10-CM | POA: Diagnosis not present

## 2020-09-13 DIAGNOSIS — S12120D Other displaced dens fracture, subsequent encounter for fracture with routine healing: Secondary | ICD-10-CM | POA: Diagnosis not present

## 2020-09-13 DIAGNOSIS — Z9181 History of falling: Secondary | ICD-10-CM | POA: Diagnosis not present

## 2020-09-13 DIAGNOSIS — R2689 Other abnormalities of gait and mobility: Secondary | ICD-10-CM | POA: Diagnosis not present

## 2020-09-13 DIAGNOSIS — R262 Difficulty in walking, not elsewhere classified: Secondary | ICD-10-CM | POA: Diagnosis not present

## 2020-09-13 DIAGNOSIS — M6281 Muscle weakness (generalized): Secondary | ICD-10-CM | POA: Diagnosis not present

## 2020-09-14 ENCOUNTER — Other Ambulatory Visit: Payer: Self-pay

## 2020-09-14 ENCOUNTER — Non-Acute Institutional Stay: Payer: Self-pay | Admitting: Student

## 2020-09-14 DIAGNOSIS — R52 Pain, unspecified: Secondary | ICD-10-CM | POA: Diagnosis not present

## 2020-09-14 DIAGNOSIS — S12100D Unspecified displaced fracture of second cervical vertebra, subsequent encounter for fracture with routine healing: Secondary | ICD-10-CM

## 2020-09-14 DIAGNOSIS — M6281 Muscle weakness (generalized): Secondary | ICD-10-CM | POA: Diagnosis not present

## 2020-09-14 DIAGNOSIS — R2681 Unsteadiness on feet: Secondary | ICD-10-CM | POA: Diagnosis not present

## 2020-09-14 DIAGNOSIS — R630 Anorexia: Secondary | ICD-10-CM

## 2020-09-14 DIAGNOSIS — R262 Difficulty in walking, not elsewhere classified: Secondary | ICD-10-CM | POA: Diagnosis not present

## 2020-09-14 DIAGNOSIS — Z515 Encounter for palliative care: Secondary | ICD-10-CM | POA: Diagnosis not present

## 2020-09-14 DIAGNOSIS — R531 Weakness: Secondary | ICD-10-CM

## 2020-09-14 DIAGNOSIS — T07XXXA Unspecified multiple injuries, initial encounter: Secondary | ICD-10-CM | POA: Diagnosis not present

## 2020-09-14 DIAGNOSIS — Z9181 History of falling: Secondary | ICD-10-CM | POA: Diagnosis not present

## 2020-09-14 DIAGNOSIS — S12120D Other displaced dens fracture, subsequent encounter for fracture with routine healing: Secondary | ICD-10-CM | POA: Diagnosis not present

## 2020-09-14 DIAGNOSIS — R2689 Other abnormalities of gait and mobility: Secondary | ICD-10-CM | POA: Diagnosis not present

## 2020-09-14 NOTE — Progress Notes (Signed)
[facility] Palliative Care Consult Note Telephone: [phone]  Fax: [phone]   Date of encounter: 09/14/20 PATIENT NAME: Vincent Jackson [id number]   [phone] (home)  DOB: 03/07/1919 MRN: [id number] PRIMARY CARE PROVIDER:    Rusty Aus, MD,  [facility] [place] [address] [phone]  REFERRING PROVIDER:   Dr. Ouida Sills  RESPONSIBLE PARTY:    Contact Information     Name Relation Home Work Mobile   Vincent Jackson Spouse [phone]  [phone]   Vincent Jackson Daughter [phone]  [phone]   Cherre Blanc [phone]     Carnuel Other [phone]     Rosiland Oz Daughter   [phone]        I met face to face with patient in the facility. Palliative Care was asked to follow this patient by consultation request of  Dr. Ouida Sills to address advance care planning and complex medical decision making. This is the initial visit.                                     ASSESSMENT AND PLAN / RECOMMENDATIONS:   Advance Care Planning/Goals of Care: Goals include to maximize quality of life and symptom management. Patient/health care surrogate gave his/her permission to discuss.Our advance care planning conversation included a discussion about:    The value and importance of advance care planning  CODE STATUS: DNR  Discussed Palliative Medicine vs. Hospice services with staff. Will call to discuss with POA. Will also discuss hospice eligibility with hospice physician. Palliative Medicine will continue to provide support.  Symptom Management/Plan:  Displaced fracture of 2nd cervical vertebrae-patient with follow up Neurosurgery yesterday. Patient to be changed to Aspen collar from Virgil J collar due to non-compliance. F/u on 10/11/20 as scheduled.  Pain-patient c/o left rib pain, generalized pain at times. Continue acetaminophen 650mg   every 4 hours, tramadol 50mg  every 6 hours prn.   Appetite-patient with fair appetite. Recommend offering foods patient enjoys. Last albumin 3.2 12/2019.   Generalized weakness-continue PT/OT as able to tolerate. Recommend pressure relief cushion to geri chair. Staff to assist with adl's, transfers.   Wounds-patient with wounds to bilateral heels,  mid spine, sacrum, left gluteal fold. Continue wound care per facility, Mepilex dressings. Recommend pressure relief cushion for geri chair. Continue Augmentin as directed for right heel cellulitis.    Follow up Palliative Care Visit: Palliative care will continue to follow for complex medical decision making, advance care planning, and clarification of goals. Return in 4 weeks or prn.  I spent 60 minutes providing this consultation. More than 50% of the time in this consultation was spent in counseling and care coordination.   PPS: 30%  HOSPICE ELIGIBILITY/DIAGNOSIS: TBD  Chief Complaint: Palliative Medicine initial consult.  HISTORY OF PRESENT ILLNESS:  Vincent GLASPY is a 87 y.o. year old male  with displaced fracture of 2nd cervical vertebrae, T2DM, nonrheumatic aortic valve stenosis, hypertensive heart disease with heart failure, mixed hyperlipidemia, CKD 3b, PVD, GERD, malignant neoplasm of bladder, prostate, hx of rib fractures 12/18/19. Patient is s/p dual chamber pacemaker, EF 50% in 2018, moderate mitral regurgitation, mild aortic stenosis.  Patient resides at [facility] on [address] unit. Patient had a fall on 5/27 sustained second cervical fracture. He was independent for adls up until this point. Now he is dependent for all adl's, requires  assist x 2 to stand and pivot for transfers. Appetite is fair overall. Receiving a NAS diet. Sleep varies, he is sleeping more during the day per staff. Poor safety awareness; he attempts to get up unassisted. Anxious at times. Has a urostomy; he was able to manage care, now care performed  by staff. Has right heel wound/ blister per staff. Now on antibiotics for cellulitis to right heel. Has wounds to bilateral heels Wounds to mid spine, sacrum, left gluteal fold. Wounds are covered by Mepilex dressings.   Labs 08/23/2020: sodium 137, potassium 4.6, BUN 39, creatinine 1.74, calcium 8.8, GFR 35 RBC 3.44, wbc 7.7, hemoglobin 10.7, hct 32.8, platelets 152. Albumin 12/28/19: 3.2   History obtained from review of EMR, discussion with primary team, and interview with family, facility staff/caregiver and/or Mr. Gartman.  I reviewed available labs, medications, imaging, studies and related documents from the EMR.  Records reviewed and summarized above.   ROS  General: NAD EYES: denies vision changes ENMT: denies dysphagia Cardiovascular: denies chest pain Pulmonary: denies cough, denies increased SOB Abdomen: denies constipation, endorses continence of bowel GU: urostomy MSK: weakness,  no recent falls reported Skin: bilateral heels, mid spine, sacrum, left gluteal fold Neurological: denies headaches, dizziness, + difficulty ambulating Psych: Endorses stable mood Heme/lymph/immuno: denies bruises, abnormal bleeding  Physical Exam: Current and past weights: July 2022-145.3 pounds June 2022-145.5 pounds, May 2022-147.7 pounds Pulse: 68, resp 16, sats 96% on room air Constitutional: NAD, cervical collar not in place General: frail appearing, thin EYES: anicteric sclera, lids intact, no discharge  ENMT: intact hearing, oral mucous membranes moist CV: S1S2, RRR, no LE edema Pulmonary: LCTA, no increased work of breathing, no cough Abdomen: normo-active BS + 4 quadrants, soft and non tender GU: deferred MSK:  moves all extremities, non-ambulatory Skin: cool and dry, no rashes visualized. Dressings to bilateral heels CDI; wounds not visualized.  Neuro: generalized weakness, A & O x 2 Psych: non-anxious affect Hem/lymph/immuno: no widespread bruising CURRENT PROBLEM LIST:   Patient Active Problem List   Diagnosis Date Noted   Flail chest 12/18/2019   Fall 05/19/2016   Obstruction of left tear duct 05/19/2016   Hip pain 04/16/2016   Viral sepsis (HCC) 03/12/2016   Influenza 03/12/2016   PVD (peripheral vascular disease) (HCC) 01/24/2016   Decreased pedal pulses 12/16/2015   History of bladder cancer 12/16/2015   CAD (coronary artery disease) 09/09/2015   Hyperlipidemia 09/09/2015   Diabetes (HCC) 09/09/2015   PAST MEDICAL HISTORY:  Active Ambulatory Problems    Diagnosis Date Noted   CAD (coronary artery disease) 09/09/2015   Hyperlipidemia 09/09/2015   Diabetes (HCC) 09/09/2015   Decreased pedal pulses 12/16/2015   History of bladder cancer 12/16/2015   PVD (peripheral vascular disease) (HCC) 01/24/2016   Viral sepsis (HCC) 03/12/2016   Influenza 03/12/2016   Hip pain 04/16/2016   Fall 05/19/2016   Obstruction of left tear duct 05/19/2016   Flail chest 12/18/2019   Resolved Ambulatory Problems    Diagnosis Date Noted   No Resolved Ambulatory Problems   Past Medical History:  Diagnosis Date   Cancer (HCC)    Diabetes mellitus without complication (HCC)    Diastolic heart failure (HCC)    Prostate cancer (HCC)    SOCIAL HX:  Social History   Tobacco Use   Smoking status: Former    Types: Cigarettes    Quit date: 09/08/1997    Years since quitting: 23.0   Smokeless tobacco: Never  Substance Use Topics  Alcohol use: Yes    Alcohol/week: 0.0 standard drinks    Comment: Occasional    FAMILY HX:  Family History  Problem Relation Age of Onset   Cataracts Other    Diabetes Other       ALLERGIES: No Known Allergies   PERTINENT MEDICATIONS:  Outpatient Encounter Medications as of 09/14/2020  Medication Sig   acetaminophen (TYLENOL) 500 MG tablet Take 1 tablet (500 mg total) by mouth every 8 (eight) hours as needed for moderate pain. (Patient not taking: Reported on 08/24/2020)   aspirin EC 81 MG tablet Take 81 mg by mouth daily.     Cyanocobalamin 500 MCG SUBL Place 500 mcg under the tongue daily.   glipiZIDE (GLUCOTROL XL) 10 MG 24 hr tablet Take 10 mg by mouth daily with breakfast.   insulin aspart (NOVOLOG) 100 UNIT/ML injection Substitute to any brand approved.Before each meal 3 times a day, 140-199 - 2 units, 200-250 - 4 units, 251-299 - 6 units,  300-349 - 8 units,  350 or above 10 units. Dispense syringes and needles as needed, Ok to switch to PEN if approved. DX DM2, Code E11.65 (Patient not taking: Reported on 08/24/2020)   latanoprost (XALATAN) 0.005 % ophthalmic solution Place 1 drop into both eyes at bedtime.    metoprolol succinate (TOPROL-XL) 25 MG 24 hr tablet Take 25 mg by mouth daily.    Multiple Vitamin (MULTIVITAMIN WITH MINERALS) TABS tablet Take 1 tablet by mouth daily.   pantoprazole (PROTONIX) 40 MG tablet TAKE ONE TABLET EVERY MORNING FOR GERD (Patient taking differently: Take 40 mg by mouth daily.)   potassium chloride (KLOR-CON) 10 MEQ tablet Take 10 mEq by mouth daily. (Patient not taking: No sig reported)   simvastatin (ZOCOR) 40 MG tablet Take 40 mg by mouth daily.   No facility-administered encounter medications on file as of 09/14/2020.   Thank you for the opportunity to participate in the care of Mr. Memoli.  The palliative care team will continue to follow. Please call our office at [phone] if we can be of additional assistance.   Ezekiel Slocumb, NP   COVID-19 PATIENT SCREENING TOOL Asked and negative response unless otherwise noted:  Have you had symptoms of covid, tested positive or been in contact with someone with symptoms/positive test in the past 5-10 days? No

## 2020-09-15 ENCOUNTER — Telehealth: Payer: Self-pay | Admitting: Student

## 2020-09-15 DIAGNOSIS — R262 Difficulty in walking, not elsewhere classified: Secondary | ICD-10-CM | POA: Diagnosis not present

## 2020-09-15 DIAGNOSIS — S12100D Unspecified displaced fracture of second cervical vertebra, subsequent encounter for fracture with routine healing: Secondary | ICD-10-CM | POA: Diagnosis not present

## 2020-09-15 DIAGNOSIS — R2689 Other abnormalities of gait and mobility: Secondary | ICD-10-CM | POA: Diagnosis not present

## 2020-09-15 DIAGNOSIS — S12120D Other displaced dens fracture, subsequent encounter for fracture with routine healing: Secondary | ICD-10-CM | POA: Diagnosis not present

## 2020-09-15 DIAGNOSIS — Z9181 History of falling: Secondary | ICD-10-CM | POA: Diagnosis not present

## 2020-09-15 DIAGNOSIS — M6281 Muscle weakness (generalized): Secondary | ICD-10-CM | POA: Diagnosis not present

## 2020-09-15 DIAGNOSIS — R2681 Unsteadiness on feet: Secondary | ICD-10-CM | POA: Diagnosis not present

## 2020-09-15 NOTE — Telephone Encounter (Signed)
Message left for daughter, POA to discuss recent palliative medicine visit, possible hospice referral. Awaiting return call.

## 2020-09-15 NOTE — Telephone Encounter (Signed)
Palliative NP received return cal from patient's daughter. We discussed palliative vs. Hospice services. She is in agreement. Will make facility aware, send over order for hospice evaluation.

## 2020-09-16 DIAGNOSIS — R262 Difficulty in walking, not elsewhere classified: Secondary | ICD-10-CM | POA: Diagnosis not present

## 2020-09-16 DIAGNOSIS — Z9181 History of falling: Secondary | ICD-10-CM | POA: Diagnosis not present

## 2020-09-16 DIAGNOSIS — S12120D Other displaced dens fracture, subsequent encounter for fracture with routine healing: Secondary | ICD-10-CM | POA: Diagnosis not present

## 2020-09-16 DIAGNOSIS — R2681 Unsteadiness on feet: Secondary | ICD-10-CM | POA: Diagnosis not present

## 2020-09-16 DIAGNOSIS — R2689 Other abnormalities of gait and mobility: Secondary | ICD-10-CM | POA: Diagnosis not present

## 2020-09-16 DIAGNOSIS — M6281 Muscle weakness (generalized): Secondary | ICD-10-CM | POA: Diagnosis not present

## 2020-09-16 DIAGNOSIS — S12100D Unspecified displaced fracture of second cervical vertebra, subsequent encounter for fracture with routine healing: Secondary | ICD-10-CM | POA: Diagnosis not present

## 2020-09-26 DIAGNOSIS — S12100D Unspecified displaced fracture of second cervical vertebra, subsequent encounter for fracture with routine healing: Secondary | ICD-10-CM | POA: Diagnosis not present

## 2020-09-26 DIAGNOSIS — E1151 Type 2 diabetes mellitus with diabetic peripheral angiopathy without gangrene: Secondary | ICD-10-CM | POA: Diagnosis not present

## 2020-09-26 DIAGNOSIS — J431 Panlobular emphysema: Secondary | ICD-10-CM | POA: Diagnosis not present

## 2020-09-26 DIAGNOSIS — I739 Peripheral vascular disease, unspecified: Secondary | ICD-10-CM | POA: Diagnosis not present

## 2020-09-29 DIAGNOSIS — E1122 Type 2 diabetes mellitus with diabetic chronic kidney disease: Secondary | ICD-10-CM | POA: Diagnosis not present

## 2020-09-29 DIAGNOSIS — I350 Nonrheumatic aortic (valve) stenosis: Secondary | ICD-10-CM | POA: Diagnosis not present

## 2020-09-29 DIAGNOSIS — I051 Rheumatic mitral insufficiency: Secondary | ICD-10-CM | POA: Diagnosis not present

## 2020-09-29 DIAGNOSIS — I509 Heart failure, unspecified: Secondary | ICD-10-CM | POA: Diagnosis not present

## 2020-09-29 DIAGNOSIS — N1832 Chronic kidney disease, stage 3b: Secondary | ICD-10-CM | POA: Diagnosis not present

## 2020-09-29 DIAGNOSIS — E43 Unspecified severe protein-calorie malnutrition: Secondary | ICD-10-CM | POA: Diagnosis not present

## 2020-09-29 DIAGNOSIS — I130 Hypertensive heart and chronic kidney disease with heart failure and stage 1 through stage 4 chronic kidney disease, or unspecified chronic kidney disease: Secondary | ICD-10-CM | POA: Diagnosis not present

## 2020-10-07 ENCOUNTER — Emergency Department

## 2020-10-07 ENCOUNTER — Emergency Department
Admission: EM | Admit: 2020-10-07 | Discharge: 2020-10-08 | Disposition: A | Discharge status: Home / Self Care | Attending: Emergency Medicine | Admitting: Emergency Medicine

## 2020-10-07 ENCOUNTER — Other Ambulatory Visit: Payer: Self-pay

## 2020-10-07 DIAGNOSIS — M47812 Spondylosis without myelopathy or radiculopathy, cervical region: Secondary | ICD-10-CM | POA: Diagnosis not present

## 2020-10-07 DIAGNOSIS — Z8546 Personal history of malignant neoplasm of prostate: Secondary | ICD-10-CM | POA: Diagnosis not present

## 2020-10-07 DIAGNOSIS — S0282XA Fracture of other specified skull and facial bones, left side, initial encounter for closed fracture: Secondary | ICD-10-CM

## 2020-10-07 DIAGNOSIS — Z8551 Personal history of malignant neoplasm of bladder: Secondary | ICD-10-CM | POA: Insufficient documentation

## 2020-10-07 DIAGNOSIS — I5030 Unspecified diastolic (congestive) heart failure: Secondary | ICD-10-CM | POA: Insufficient documentation

## 2020-10-07 DIAGNOSIS — S12100A Unspecified displaced fracture of second cervical vertebra, initial encounter for closed fracture: Secondary | ICD-10-CM | POA: Diagnosis not present

## 2020-10-07 DIAGNOSIS — Z7982 Long term (current) use of aspirin: Secondary | ICD-10-CM | POA: Diagnosis not present

## 2020-10-07 DIAGNOSIS — S065XAA Traumatic subdural hemorrhage with loss of consciousness status unknown, initial encounter: Secondary | ICD-10-CM

## 2020-10-07 DIAGNOSIS — I6529 Occlusion and stenosis of unspecified carotid artery: Secondary | ICD-10-CM | POA: Diagnosis not present

## 2020-10-07 DIAGNOSIS — Z950 Presence of cardiac pacemaker: Secondary | ICD-10-CM | POA: Diagnosis not present

## 2020-10-07 DIAGNOSIS — S0990XA Unspecified injury of head, initial encounter: Secondary | ICD-10-CM | POA: Diagnosis present

## 2020-10-07 DIAGNOSIS — S065X0A Traumatic subdural hemorrhage without loss of consciousness, initial encounter: Secondary | ICD-10-CM | POA: Diagnosis not present

## 2020-10-07 DIAGNOSIS — S065X9A Traumatic subdural hemorrhage with loss of consciousness of unspecified duration, initial encounter: Secondary | ICD-10-CM

## 2020-10-07 DIAGNOSIS — Z7984 Long term (current) use of oral hypoglycemic drugs: Secondary | ICD-10-CM | POA: Diagnosis not present

## 2020-10-07 DIAGNOSIS — I611 Nontraumatic intracerebral hemorrhage in hemisphere, cortical: Secondary | ICD-10-CM | POA: Diagnosis not present

## 2020-10-07 DIAGNOSIS — Z794 Long term (current) use of insulin: Secondary | ICD-10-CM | POA: Diagnosis not present

## 2020-10-07 DIAGNOSIS — E119 Type 2 diabetes mellitus without complications: Secondary | ICD-10-CM | POA: Diagnosis not present

## 2020-10-07 DIAGNOSIS — Y92129 Unspecified place in nursing home as the place of occurrence of the external cause: Secondary | ICD-10-CM | POA: Insufficient documentation

## 2020-10-07 DIAGNOSIS — Z87891 Personal history of nicotine dependence: Secondary | ICD-10-CM | POA: Insufficient documentation

## 2020-10-07 DIAGNOSIS — S0240FA Zygomatic fracture, left side, initial encounter for closed fracture: Secondary | ICD-10-CM | POA: Diagnosis not present

## 2020-10-07 DIAGNOSIS — S0219XA Other fracture of base of skull, initial encounter for closed fracture: Secondary | ICD-10-CM | POA: Diagnosis not present

## 2020-10-07 DIAGNOSIS — I2510 Atherosclerotic heart disease of native coronary artery without angina pectoris: Secondary | ICD-10-CM | POA: Diagnosis not present

## 2020-10-07 DIAGNOSIS — G319 Degenerative disease of nervous system, unspecified: Secondary | ICD-10-CM | POA: Diagnosis not present

## 2020-10-07 DIAGNOSIS — S066X0A Traumatic subarachnoid hemorrhage without loss of consciousness, initial encounter: Secondary | ICD-10-CM | POA: Diagnosis not present

## 2020-10-07 DIAGNOSIS — S12100S Unspecified displaced fracture of second cervical vertebra, sequela: Secondary | ICD-10-CM | POA: Insufficient documentation

## 2020-10-07 DIAGNOSIS — R42 Dizziness and giddiness: Secondary | ICD-10-CM | POA: Diagnosis not present

## 2020-10-07 DIAGNOSIS — S12190D Other displaced fracture of second cervical vertebra, subsequent encounter for fracture with routine healing: Secondary | ICD-10-CM

## 2020-10-07 DIAGNOSIS — W19XXXA Unspecified fall, initial encounter: Secondary | ICD-10-CM | POA: Insufficient documentation

## 2020-10-07 NOTE — ED Provider Notes (Signed)
[facility] Emergency Department Provider Note  Time seen: 10:12 PM  I have reviewed the triage vital signs and the nursing notes.   HISTORY  Chief Complaint Fall  HPI Vincent Jackson is a 87 y.o. male with a past medical history of CAD, diabetes, hyperlipidemia, presents to the emergency department after a fall.  According to report patient has been recently diagnosed with a cervical spine fracture, currently has a Miami J collar in place however EMS notes at the time of the fall patient did not have a Miami J collar in place.  Here patient is awake alert, able to give a decent history.  Is aware of the cervical spine fracture but denies any pain to the neck.  Denies any headache.  Per report patient did have a nosebleed however no sign of blood on my evaluation.  Patient has no complaints.   Past Medical History:  Diagnosis Date   CAD (coronary artery disease)    Cancer ([place])    Bladder cancer    Diabetes mellitus without complication ([place])    Diastolic heart failure ([place])    Hyperlipidemia    Prostate cancer [facility])     Patient Active Problem List   Diagnosis Date Noted   Flail chest 12/18/2019   Fall 05/19/2016   Obstruction of left tear duct 05/19/2016   Hip pain 04/16/2016   Viral sepsis ([place]) 03/12/2016   Influenza 03/12/2016   PVD (peripheral vascular disease) ([place]) 01/24/2016   Decreased pedal pulses 12/16/2015   History of bladder cancer 12/16/2015   CAD (coronary artery disease) 09/09/2015   Hyperlipidemia 09/09/2015   Diabetes ([place]) 09/09/2015    Past Surgical History:  Procedure Laterality Date   BLADDER REMOVAL  2007   MASTOIDECTOMY     PACEMAKER INSERTION Left 02/24/2019   Procedure: PACEMAKER  GENERATOR CHANGE OUT;  Surgeon: Isaias Cowman, MD;  Location: [facility] ORS;  Service: Cardiovascular;  Laterality: Left;   PROSTATE SURGERY  2007   removed    TONSILLECTOMY      Prior to Admission medications   Medication Sig Start  Date End Date Taking? Authorizing Provider  acetaminophen (TYLENOL) 500 MG tablet Take 1 tablet (500 mg total) by mouth every 8 (eight) hours as needed for moderate pain. Patient not taking: Reported on 08/24/2020 12/21/19 12/20/20  Thurnell Lose, MD  aspirin EC 81 MG tablet Take 81 mg by mouth daily.     [provider]  Cyanocobalamin 500 MCG SUBL Place 500 mcg under the tongue daily. 08/07/16   [provider]  glipiZIDE (GLUCOTROL XL) 10 MG 24 hr tablet Take 10 mg by mouth daily with breakfast.    [provider]  insulin aspart (NOVOLOG) 100 UNIT/ML injection Substitute to any brand approved.Before each meal 3 times a day, 140-199 - 2 units, 200-250 - 4 units, 251-299 - 6 units,  300-349 - 8 units,  350 or above 10 units. Dispense syringes and needles as needed, Ok to switch to PEN if approved. DX DM2, Code E11.65 Patient not taking: Reported on 08/24/2020 12/21/19   Thurnell Lose, MD  latanoprost (XALATAN) 0.005 % ophthalmic solution Place 1 drop into both eyes at bedtime.  11/28/15   [provider]  metoprolol succinate (TOPROL-XL) 25 MG 24 hr tablet Take 25 mg by mouth daily.  07/05/15   [provider]  Multiple Vitamin (MULTIVITAMIN WITH MINERALS) TABS tablet Take 1 tablet by mouth daily.    [provider]  pantoprazole (  PROTONIX) 40 MG tablet TAKE ONE TABLET EVERY MORNING FOR GERD Patient taking differently: Take 40 mg by mouth daily. 08/21/16   Leone Haven, MD  potassium chloride (KLOR-CON) 10 MEQ tablet Take 10 mEq by mouth daily. Patient not taking: No sig reported    [provider]  simvastatin (ZOCOR) 40 MG tablet Take 40 mg by mouth daily. 01/07/19   [provider]    No Known Allergies  Family History  Problem Relation Age of Onset   Cataracts Other    Diabetes Other     Social History Social History   Tobacco Use   Smoking status: Former    Types: Cigarettes    Quit date: 09/08/1997     Years since quitting: 23.0   Smokeless tobacco: Never  Substance Use Topics   Alcohol use: Yes    Alcohol/week: 0.0 standard drinks    Comment: Occasional    Drug use: No    Review of Systems Constitutional: Negative for fever. Cardiovascular: Negative for chest pain. Respiratory: Negative for shortness of breath. Gastrointestinal: Negative for abdominal pain Musculoskeletal: Negative for neck or back pain. Neurological: Negative for headache All other ROS negative  ____________________________________________   PHYSICAL EXAM:  VITAL SIGNS: ED Triage Vitals  Enc Vitals Group     BP 10/07/20 2044 110/66     Pulse Rate 10/07/20 2044 98     Resp 10/07/20 2044 16     Temp 10/07/20 2044 97.7 F (36.5 C)     Temp Source 10/07/20 2044 Oral     SpO2 --      Weight 10/07/20 2045 125 lb 9.6 oz (57 kg)     Height --      Head Circumference --      Peak Flow --      Pain Score 10/07/20 2044 5     Pain Loc --      Pain Edu? --      Excl. in [place]? --    Constitutional: Awake alert, no acute distress. Eyes: Normal exam ENT      Head: Normocephalic and atraumatic.      Nose: No septal hematoma.  No epistaxis.      Mouth/Throat: Mucous membranes are moist. Cardiovascular: Normal rate, regular rhythm.  Respiratory: Normal respiratory effort without tachypnea nor retractions. Breath sounds are clear Gastrointestinal: Soft and nontender. No distention.  Musculoskeletal: Patient currently has a Miami J collar in place.  Nontender C-spine.  Mild tenderness in the bilateral shoulders but great range of motion bilaterally. Neurologic:  Normal speech and language. No gross focal neurologic deficits  Skin: Small skin tear to left forearm. Psychiatric: Mood and affect are normal.   ____________________________________________    RADIOLOGY  IMPRESSION:  1. Trace acute bilateral subdural hematomas measuring up to 2 mm  maximum thickness left and 3 mm maximum thickness on the right   anterior to the temporal lobe. Small amounts of subarachnoid  hemorrhage versus contusion within the right greater than left  temporal lobes with suspected edema in the right temporal lobe.  2. Interval finding of linear nondisplaced fracture involving left  parietal and temporal bones with extension of fracture lucency  across the floor/skull base of left middle cranial fossa, likely  extending to the sphenoid sinus where there is fluid level now seen.  There is additional new nondisplaced fracture involving the left  zygomatic arch.  3. Redemonstrated complex fracture involving C2, that involves the  base of the dens, lateral masses, and  right pedicle of C2. There is  incomplete fracture healing. There is slight increased fracture  displacement compared to prior.   ____________________________________________   INITIAL IMPRESSION / ASSESSMENT AND PLAN / ED COURSE  Pertinent labs & imaging results that were available during my care of the patient were reviewed by me and considered in my medical decision making (see chart for details).   Patient presents emergency department after a fall.  Known cervical spine fracture currently being treated with immobilization with a Miami J collar.  Patient had a fall tonight.  Denies any C-spine pain or head injury.  No LOC reported.  We will obtain CT imaging of the head and C-spine as precaution.  Miami J collar in place currently.  No other signs of traumatic injury patient did have a small amount of blood on his face likely from the reported nosebleed however no dried blood within the nostril or septal hematoma currently.  Patient appears overall well.  If CTs are negative anticipate discharge back to his nursing facility.  Patient with multiple CT findings including a 2 mm left and 3 mm right subdural hematoma with a small amount of subarachnoid hemorrhage versus contusion.  Several skull fractures as well as slight displacement of the known C2  fracture.  Patient appears to be neurologically intact distally.  Patient has no complaints himself.  I spoke to Dr. Lacinda Axon of neurosurgery who states the patient with his age and comorbidities would not be a neurosurgical candidate but at this time does not require any neurosurgical intervention.  We will keep the patient in the emergency department and repeat a CT scan at 6 hours to ensure no further intracranial bleeding.  As long as the patient remains mentally intact with no changes on CT I believe the patient would be safe for discharge back to his nursing facility for continued monitoring.  Patient care signed out to oncoming provider.  Vincent Jackson was evaluated in Emergency Department on 10/07/2020 for the symptoms described in the history of present illness. He was evaluated in the context of the global COVID-19 pandemic, which necessitated consideration that the patient might be at risk for infection with the SARS-CoV-2 virus that causes COVID-19. Institutional protocols and algorithms that pertain to the evaluation of patients at risk for COVID-19 are in a state of rapid change based on information released by regulatory bodies including the CDC and federal and state organizations. These policies and algorithms were followed during the patient's care in the ED.  ____________________________________________   FINAL CLINICAL IMPRESSION(S) / ED DIAGNOSES  Fall Head injury Subdural hematoma Subarachnoid hemorrhage C2 fracture, sequela   Harvest Dark, MD 10/07/20 2250

## 2020-10-07 NOTE — ED Triage Notes (Signed)
Unwitnessed fall from nursing home. Prev cervical dx from fall. Was not wearing collar at the time.  Pt does not know why he fell. Complains of neck pain, left shoulder pain and there are skin tears on l side.  Pt was found with left arm b behind his back. States his head only hurts when he is moved.

## 2020-10-07 NOTE — ED Notes (Signed)
Bed alarm placed.  

## 2020-10-08 ENCOUNTER — Emergency Department

## 2020-10-08 DIAGNOSIS — R404 Transient alteration of awareness: Secondary | ICD-10-CM | POA: Diagnosis not present

## 2020-10-08 DIAGNOSIS — S065X0A Traumatic subdural hemorrhage without loss of consciousness, initial encounter: Secondary | ICD-10-CM | POA: Diagnosis not present

## 2020-10-08 DIAGNOSIS — Z743 Need for continuous supervision: Secondary | ICD-10-CM | POA: Diagnosis not present

## 2020-10-08 DIAGNOSIS — S0219XA Other fracture of base of skull, initial encounter for closed fracture: Secondary | ICD-10-CM | POA: Diagnosis not present

## 2020-10-08 DIAGNOSIS — I611 Nontraumatic intracerebral hemorrhage in hemisphere, cortical: Secondary | ICD-10-CM | POA: Diagnosis not present

## 2020-10-08 DIAGNOSIS — S066X0A Traumatic subarachnoid hemorrhage without loss of consciousness, initial encounter: Secondary | ICD-10-CM | POA: Diagnosis not present

## 2020-10-08 NOTE — ED Notes (Signed)
Spoke to daughter over the phone and read her the dc instructions. Verbal understanding given.

## 2020-10-08 NOTE — ED Notes (Signed)
Report called to [place] at facility

## 2020-10-08 NOTE — Discharge Instructions (Addendum)
Patient was found to have a left-sided skull fracture.  Also found to have multiple small areas of bleeding in the brain that are stable and some appear improved on repeat CT head imaging.  He also has a known C2 fracture with slight increased displacement after his recent fall.  He needs to wear his [place] J collar at all times.  He does not need neurosurgical intervention but can follow-up with Dr. Lacinda Axon as needed.  He may be given Tylenol 1000 mg every 6 hours as needed for pain.

## 2020-10-08 NOTE — ED Provider Notes (Signed)
  Physical Exam  BP 124/85   Pulse 89   Temp 97.7 F (36.5 C) (Oral)   Resp 14   Wt 57 kg   SpO2 94%   BMI 17.03 kg/m   Physical Exam  ED Course/Procedures     Procedures  MDM  11:30 PM  Assumed care at shift change.  Patient from nursing facility with fall.  Has previous C2 spinal fracture in a Miami J collar.  CT head showed bilateral subdurals and small area of subarachnoid versus contusion and worsening displacement of his C2 fracture.  Dr. Lacinda Axon with neurosurgery consulted and recommends patient have repeat head CT in 6 hours which is ordered for 4 AM.  Patient not a surgical candidate at this time.  At his neurologic baseline per previous provider.  5:35 AM  Pt's repeat head CT shows no new intracranial abnormality.  Bleeding does not appear worse and actually appears like it is already improving in some areas.  He denies any pain.  He is repeatedly taking off his [place] J collar.  Have advised him to keep this on but I think his dementia is playing a significant role.  Will discharge back to his nursing facility.  Provided with outpatient neurosurgical follow-up as needed.  At this time, I do not feel there is any life-threatening condition present. I have reviewed, interpreted and discussed all results (EKG, imaging, lab, urine as appropriate) and exam findings with patient/family. I have reviewed nursing notes and appropriate previous records.  I feel the patient is safe to be discharged home without further emergent workup and can continue workup as an outpatient as needed. Discussed usual and customary return precautions. Patient/family verbalize understanding and are comfortable with this plan.  Outpatient follow-up has been provided as needed. All questions have been answered.        Robbye Dede, Delice Bison, DO 10/08/20 [id number]

## 2020-10-08 NOTE — ED Notes (Signed)
Request made for [facility] to transport to [facility]

## 2020-10-08 NOTE — ED Notes (Signed)
Spoke to daughter on the phone who is aware of pt going home.

## 2020-10-12 DIAGNOSIS — Z8781 Personal history of (healed) traumatic fracture: Secondary | ICD-10-CM | POA: Diagnosis not present

## 2020-10-12 DIAGNOSIS — S12100D Unspecified displaced fracture of second cervical vertebra, subsequent encounter for fracture with routine healing: Secondary | ICD-10-CM | POA: Diagnosis not present

## 2020-10-27 DEATH — deceased
